# Patient Record
Sex: Male | Born: 1960
Health system: Southern US, Community
[De-identification: ages and names within clinical notes are randomized; demographics above are authoritative.]

## PROBLEM LIST (undated history)

## (undated) DIAGNOSIS — F32A Depression, unspecified: Secondary | ICD-10-CM

## (undated) DIAGNOSIS — F419 Anxiety disorder, unspecified: Secondary | ICD-10-CM

## (undated) DIAGNOSIS — R002 Palpitations: Secondary | ICD-10-CM

## (undated) DIAGNOSIS — E119 Type 2 diabetes mellitus without complications: Secondary | ICD-10-CM

## (undated) DIAGNOSIS — I739 Peripheral vascular disease, unspecified: Secondary | ICD-10-CM

## (undated) DIAGNOSIS — I10 Essential (primary) hypertension: Secondary | ICD-10-CM

## (undated) DIAGNOSIS — H269 Unspecified cataract: Secondary | ICD-10-CM

## (undated) DIAGNOSIS — K589 Irritable bowel syndrome without diarrhea: Secondary | ICD-10-CM

## (undated) DIAGNOSIS — F329 Major depressive disorder, single episode, unspecified: Secondary | ICD-10-CM

## (undated) DIAGNOSIS — E785 Hyperlipidemia, unspecified: Secondary | ICD-10-CM

## (undated) DIAGNOSIS — J45909 Unspecified asthma, uncomplicated: Secondary | ICD-10-CM

## (undated) DIAGNOSIS — J449 Chronic obstructive pulmonary disease, unspecified: Secondary | ICD-10-CM

## (undated) DIAGNOSIS — T7840XA Allergy, unspecified, initial encounter: Secondary | ICD-10-CM

## (undated) DIAGNOSIS — M109 Gout, unspecified: Secondary | ICD-10-CM

## (undated) DIAGNOSIS — K219 Gastro-esophageal reflux disease without esophagitis: Secondary | ICD-10-CM

## (undated) HISTORY — DX: Type 2 diabetes mellitus without complications: E11.9

## (undated) HISTORY — DX: Allergy, unspecified, initial encounter: T78.40XA

## (undated) HISTORY — DX: Chronic obstructive pulmonary disease, unspecified: J44.9

## (undated) HISTORY — DX: Gout, unspecified: M10.9

## (undated) HISTORY — DX: Depression, unspecified: F32.A

## (undated) HISTORY — DX: Major depressive disorder, single episode, unspecified: F32.9

## (undated) HISTORY — DX: Unspecified asthma, uncomplicated: J45.909

## (undated) HISTORY — PX: APPENDECTOMY: SHX54

## (undated) HISTORY — DX: Hyperlipidemia, unspecified: E78.5

## (undated) HISTORY — PX: HERNIA REPAIR: SHX51

## (undated) HISTORY — DX: Palpitations: R00.2

## (undated) HISTORY — DX: Unspecified cataract: H26.9

## (undated) HISTORY — DX: Gastro-esophageal reflux disease without esophagitis: K21.9

## (undated) HISTORY — DX: Peripheral vascular disease, unspecified: I73.9

## (undated) HISTORY — DX: Anxiety disorder, unspecified: F41.9

## (undated) HISTORY — DX: Irritable bowel syndrome, unspecified: K58.9

## (undated) HISTORY — DX: Essential (primary) hypertension: I10

---
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
## 1996-05-03 HISTORY — PX: FUNDOPLASTY TRANSTHORACIC: SUR617

## 2006-05-03 HISTORY — PX: FUNDOPLASTY TRANSTHORACIC: SUR617

## 2010-11-10 ENCOUNTER — Ambulatory Visit (HOSPITAL_BASED_OUTPATIENT_CLINIC_OR_DEPARTMENT_OTHER)
Admission: RE | Admit: 2010-11-10 | Discharge: 2010-11-10 | Disposition: A | Discharge status: Home / Self Care | Payer: 59 | Source: Ambulatory Visit | Attending: Orthopaedic Surgery | Admitting: Orthopaedic Surgery

## 2010-11-10 DIAGNOSIS — M25819 Other specified joint disorders, unspecified shoulder: Secondary | ICD-10-CM | POA: Insufficient documentation

## 2010-11-10 DIAGNOSIS — M75110 Incomplete rotator cuff tear or rupture of unspecified shoulder, not specified as traumatic: Secondary | ICD-10-CM | POA: Insufficient documentation

## 2010-11-10 DIAGNOSIS — J45909 Unspecified asthma, uncomplicated: Secondary | ICD-10-CM | POA: Insufficient documentation

## 2010-11-10 DIAGNOSIS — F319 Bipolar disorder, unspecified: Secondary | ICD-10-CM | POA: Insufficient documentation

## 2010-11-10 DIAGNOSIS — Z01812 Encounter for preprocedural laboratory examination: Secondary | ICD-10-CM | POA: Insufficient documentation

## 2010-12-16 NOTE — Op Note (Signed)
Matthew Ortiz, Matthew Ortiz                ACCOUNT NO.:  [id number]  MEDICAL RECORD NO.:  [id number]  LOCATION:                                 FACILITY:  PHYSICIAN:  Lubertha Basque. Jerl Santos, M.D.     DATE OF BIRTH:  DATE OF PROCEDURE:  11/10/2010 DATE OF DISCHARGE:                              OPERATIVE REPORT   PREOPERATIVE DIAGNOSES: 1. Right shoulder impingement. 2. Right shoulder partial rotator cuff tear.  POSTOPERATIVE DIAGNOSES: 1. Right shoulder impingement. 2. Right shoulder partial rotator cuff tear.Marland Kitchen  PROCEDURE: 1. Right shoulder arthroscopic acromioplasty. 2. Right shoulder arthroscopic partial claviculectomy. 3. Right shoulder arthroscopic cuff debridement.  ANESTHESIA:  General and block.  ATTENDING SURGEON:  Lubertha Basque. Jerl Santos, MD  ASSISTANT:  Lindwood Qua, PA   INDICATIONS FOR PROCEDURE:  The patient is a 50 year old man with a long history of right greater than left shoulder pain.  This has persisted despite an exercise program and 3 subacromial injections all of which did afford him transient relief.  He has had an MRI scan done which shows things consistent with impingement and a partial-thickness cuff tear on the bursal aspect.  He has pain which limits his ability to use his arm and rest and he is offered an arthroscopy.  Informed operative consent was obtained after discussion of possible complications including reaction to anesthesia and infection.  SUMMARY OF FINDINGS AND PROCEDURE:  Under general anesthesia and a block, a right shoulder arthroscopy was performed.  The glenohumeral joint showed no degenerative changes and the biceps tendon and rotator cuff appeared benign from below as did all labral structures.  In the subacromial space, he had some bursitis and partial-thickness cuff tearing addressed with debridement, but no tear worthy of repair was found.  A prominent subacromial morphology was addressed with an acromioplasty and also had a small  prominence of the [facility] joint which was removed.  He was discharged home same day.  DESCRIPTION OF PROCEDURE:  The patient was taken to the operating suite where general anesthetic was applied without difficulty.  He was also given a block in the preanesthesia area.  He was positioned in beach- chair position and prepped and draped in normal sterile fashion.  After the administration of IV vancomycin, an arthroscopy of the right shoulder was performed through a total of 2 portals.  Findings were as noted above.  Procedure consisted of mostly the acromioplasty which was done with a bur in the lateral position followed by transfer of the bur to the posterior position.  We also removed a small spur from the undersurface of the clavicle and debrided the rotator cuff bursal aspect.  The shoulder was thoroughly irrigated followed by reapproximation of the portals loosely with nylon.  Adaptic was applied followed by dry gauze and tape.  Estimated blood loss and intraoperative fluids obtained from anesthesia records.  DISPOSITION:  The patient was extubated in the operating room and taken to recovery room in stable addition.  He was to go home the same day and follow up with me in the office next week.  I will contact him by phone tonight.     Lubertha Basque Jerl Santos, M.D.  PGD/MEDQ  D:  11/10/2010  T:  11/11/2010  Job:  [id number]  Electronically Signed by Marcene Corning M.D. on 12/16/2010 08:31:49 PM

## 2011-01-12 ENCOUNTER — Ambulatory Visit (HOSPITAL_BASED_OUTPATIENT_CLINIC_OR_DEPARTMENT_OTHER)
Admission: RE | Admit: 2011-01-12 | Discharge: 2011-01-12 | Disposition: A | Discharge status: Home / Self Care | Payer: Medicare FFS | Source: Ambulatory Visit | Attending: Orthopaedic Surgery | Admitting: Orthopaedic Surgery

## 2011-01-12 DIAGNOSIS — M25819 Other specified joint disorders, unspecified shoulder: Secondary | ICD-10-CM | POA: Insufficient documentation

## 2011-01-12 DIAGNOSIS — M75110 Incomplete rotator cuff tear or rupture of unspecified shoulder, not specified as traumatic: Secondary | ICD-10-CM | POA: Insufficient documentation

## 2011-01-12 LAB — POCT I-STAT, CHEM 8
BUN: 15 mg/dL (ref 6–23)
Chloride: 106 mEq/L (ref 96–112)
Glucose, Bld: 109 mg/dL — ABNORMAL HIGH (ref 70–99)
HCT: 45 % (ref 39.0–52.0)
Hemoglobin: 15.3 g/dL (ref 13.0–17.0)
Potassium: 4.2 mEq/L (ref 3.5–5.1)
Sodium: 139 mEq/L (ref 135–145)

## 2011-01-22 NOTE — Op Note (Signed)
Matthew Ortiz, Matthew Ortiz               ACCOUNT NO.:  [id number]  MEDICAL RECORD NO.:  [id number]  LOCATION:                                 FACILITY:  PHYSICIAN:  Lubertha Basque. Jerl Santos, M.D.     DATE OF BIRTH:  DATE OF PROCEDURE:  01/12/2011 DATE OF DISCHARGE:                              OPERATIVE REPORT   PREOPERATIVE DIAGNOSES: 1. Left shoulder impingement. 2. Left shoulder partial rotator cuff tear.  POSTOPERATIVE DIAGNOSES: 1. Left shoulder impingement. 2. Left shoulder partial rotator cuff tear.  PROCEDURE: 1. Left shoulder arthroscopic acromioplasty. 2. Left shoulder arthroscopic partial claviculectomy. 3. Left shoulder arthroscopic debridement.  ANESTHESIA:  General and block.  ATTENDING SURGEON:  Lubertha Basque. Jerl Santos, MD  ASSISTANT:  Lindwood Qua, PA   INDICATION FOR PROCEDURE:  The patient is a 50 year old man with a long history of left shoulder difficulty.  This has persisted despite oral anti-inflammatories, injection, and exercise program.  He has an exam and history consistent with impingement related to the shape of the acromion and AC joint.  He has pain which limits his ability to rest and use his arm and is offered an arthroscopy.  Informed operative consent was obtained after discussion of possible complications including reaction to anesthesia and infection.  SUMMARY OF FINDINGS AND PROCEDURE:  Under general anesthesia and a block, a left shoulder arthroscopy was performed.  Glenohumeral joint showed no degenerative changes in the biceps tendon and rotator cuff appeared benign from below.  In the subacromial space, he had some irritation of the rotator cuff, but no tear worthy of repair.  A debridement was done of bursal tissues and that aspect of the cuff.  He had a prominent subacromial morphology addressed with an acromioplasty. He also had a prominence of the [facility] joint addressed with a partial claviculectomy.  He was closed primarily and discharged  home the same day.  DESCRIPTION OF PROCEDURE:  The patient was taken to the operating suite where general anesthetic was applied without difficulty.  He was also given a block in the preanesthesia area.  He was positioned in beach- chair position and prepped and draped in normal sterile fashion.  After administration of the IV antibiotic, an arthroscopy of the left shoulder was performed through a total of 2 portals.  Findings were as noted above and procedure consisted predominantly of the arthroscopic acromioplasty.  This was done with a bur in the lateral position followed by transfer of the bur to the posterior position.  We then performed a prominence on the undersurface of the clavicle in a similar fashion.  I debrided the bursal aspect of the cuff, but no tear worthy of repair was found.  The shoulder was thoroughly irrigated followed by reapproximation of portals loosely with nylon.  Adaptic was applied followed by dry gauze and tape.  Estimated blood loss and intraoperative fluids can be obtained from anesthesia records.DISPOSITION:  The patient was extubated in the operating room and taken to recovery room in stable addition.  He was to go home same day and follow up in my office closely.  I will contact him by phone tonight.     Lubertha Basque Jerl Santos, M.D.  PGD/MEDQ  D:  01/12/2011  T:  01/12/2011  Job:  [id number]  Electronically Signed by Marcene Corning M.D. on 01/22/2011 12:24:57 PM

## 2011-02-23 ENCOUNTER — Other Ambulatory Visit: Payer: Self-pay | Admitting: Internal Medicine

## 2011-02-23 DIAGNOSIS — M79604 Pain in right leg: Secondary | ICD-10-CM

## 2011-02-23 DIAGNOSIS — M79605 Pain in left leg: Secondary | ICD-10-CM

## 2011-02-24 ENCOUNTER — Ambulatory Visit
Admission: RE | Admit: 2011-02-24 | Discharge: 2011-02-24 | Disposition: A | Discharge status: Home / Self Care | Payer: 59 | Source: Ambulatory Visit | Attending: Internal Medicine | Admitting: Internal Medicine

## 2011-02-24 ENCOUNTER — Other Ambulatory Visit: Payer: Self-pay | Admitting: Internal Medicine

## 2011-02-24 DIAGNOSIS — M79604 Pain in right leg: Secondary | ICD-10-CM

## 2011-04-02 DIAGNOSIS — B958 Unspecified staphylococcus as the cause of diseases classified elsewhere: Secondary | ICD-10-CM | POA: Insufficient documentation

## 2011-04-02 DIAGNOSIS — K589 Irritable bowel syndrome without diarrhea: Secondary | ICD-10-CM | POA: Insufficient documentation

## 2011-04-02 DIAGNOSIS — R0683 Snoring: Secondary | ICD-10-CM | POA: Insufficient documentation

## 2011-04-02 DIAGNOSIS — F319 Bipolar disorder, unspecified: Secondary | ICD-10-CM | POA: Insufficient documentation

## 2011-04-02 DIAGNOSIS — K219 Gastro-esophageal reflux disease without esophagitis: Secondary | ICD-10-CM | POA: Insufficient documentation

## 2011-04-02 DIAGNOSIS — K5792 Diverticulitis of intestine, part unspecified, without perforation or abscess without bleeding: Secondary | ICD-10-CM | POA: Insufficient documentation

## 2011-04-02 DIAGNOSIS — M19019 Primary osteoarthritis, unspecified shoulder: Secondary | ICD-10-CM | POA: Insufficient documentation

## 2011-04-02 DIAGNOSIS — M75102 Unspecified rotator cuff tear or rupture of left shoulder, not specified as traumatic: Secondary | ICD-10-CM | POA: Insufficient documentation

## 2011-04-02 DIAGNOSIS — R000 Tachycardia, unspecified: Secondary | ICD-10-CM | POA: Insufficient documentation

## 2011-05-03 DIAGNOSIS — K648 Other hemorrhoids: Secondary | ICD-10-CM | POA: Insufficient documentation

## 2011-05-03 DIAGNOSIS — N529 Male erectile dysfunction, unspecified: Secondary | ICD-10-CM | POA: Insufficient documentation

## 2011-05-03 DIAGNOSIS — N400 Enlarged prostate without lower urinary tract symptoms: Secondary | ICD-10-CM | POA: Insufficient documentation

## 2012-05-03 HISTORY — PX: HERNIA REPAIR: SHX51

## 2013-10-31 ENCOUNTER — Ambulatory Visit (HOSPITAL_COMMUNITY): Payer: 59 | Admitting: Psychiatry

## 2013-12-27 ENCOUNTER — Ambulatory Visit (INDEPENDENT_AMBULATORY_CARE_PROVIDER_SITE_OTHER): Payer: 59 | Admitting: Psychiatry

## 2014-01-03 ENCOUNTER — Ambulatory Visit (HOSPITAL_COMMUNITY): Payer: Self-pay | Admitting: Psychiatry

## 2014-01-22 ENCOUNTER — Encounter (HOSPITAL_COMMUNITY): Payer: Self-pay | Admitting: Psychiatry

## 2014-01-22 ENCOUNTER — Ambulatory Visit (INDEPENDENT_AMBULATORY_CARE_PROVIDER_SITE_OTHER): Payer: 59 | Admitting: Psychiatry

## 2014-01-22 VITALS — BP 132/108 | HR 80 | Ht 72.0 in | Wt 184.4 lb

## 2014-01-22 DIAGNOSIS — F424 Excoriation (skin-picking) disorder: Secondary | ICD-10-CM

## 2014-01-22 DIAGNOSIS — F5105 Insomnia due to other mental disorder: Secondary | ICD-10-CM

## 2014-01-22 DIAGNOSIS — F3189 Other bipolar disorder: Secondary | ICD-10-CM

## 2014-01-22 DIAGNOSIS — F411 Generalized anxiety disorder: Secondary | ICD-10-CM

## 2014-01-22 DIAGNOSIS — F489 Nonpsychotic mental disorder, unspecified: Secondary | ICD-10-CM

## 2014-01-22 DIAGNOSIS — F4310 Post-traumatic stress disorder, unspecified: Secondary | ICD-10-CM

## 2014-01-22 DIAGNOSIS — F3181 Bipolar II disorder: Secondary | ICD-10-CM

## 2014-01-22 MED ORDER — MIRTAZAPINE 15 MG PO TABS: 15.0000 mg | ORAL_TABLET | Freq: Every day | ORAL | Status: DC
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
## 2014-01-29 DIAGNOSIS — K5732 Diverticulitis of large intestine without perforation or abscess without bleeding: Secondary | ICD-10-CM | POA: Insufficient documentation

## 2014-02-01 ENCOUNTER — Telehealth (HOSPITAL_COMMUNITY): Payer: Self-pay | Admitting: *Deleted

## 2014-02-01 NOTE — Telephone Encounter (Signed)
Pt called left message stating he has been weaned off Effexor and is not doing well with it. Called him back he describes feeling like electricity shooting through his body, crying, disoriented, "feels like my brain is bouncing around in my head. Just feels really weird." Talked with his therapist yesterday and wants his doctor to know. Asking if anything can be done.

## 2014-02-05 NOTE — Telephone Encounter (Signed)
Called and left general voice message on home phone number and on work number.   Based off message received it appears that he is experiencing withdrawal symptoms. Slowing the Effexor taper can help decrease withdrawal symptoms.

## 2014-03-26 ENCOUNTER — Encounter (HOSPITAL_COMMUNITY): Payer: Self-pay | Admitting: Psychiatry

## 2014-03-26 ENCOUNTER — Ambulatory Visit (INDEPENDENT_AMBULATORY_CARE_PROVIDER_SITE_OTHER): Payer: 59 | Admitting: Psychiatry

## 2014-03-26 VITALS — BP 129/91 | HR 87 | Ht 72.0 in | Wt 187.0 lb

## 2014-03-26 DIAGNOSIS — F411 Generalized anxiety disorder: Secondary | ICD-10-CM

## 2014-03-26 DIAGNOSIS — F3181 Bipolar II disorder: Secondary | ICD-10-CM

## 2014-03-26 DIAGNOSIS — F5105 Insomnia due to other mental disorder: Secondary | ICD-10-CM

## 2014-03-26 DIAGNOSIS — F4310 Post-traumatic stress disorder, unspecified: Secondary | ICD-10-CM

## 2014-03-26 DIAGNOSIS — F489 Nonpsychotic mental disorder, unspecified: Secondary | ICD-10-CM

## 2014-03-26 MED ORDER — LITHIUM CARBONATE 150 MG PO CAPS: 450.0000 mg | ORAL_CAPSULE | Freq: Two times a day (BID) | ORAL | Status: DC

## 2014-03-26 MED ORDER — MIRTAZAPINE 15 MG PO TABS: 15.0000 mg | ORAL_TABLET | Freq: Every day | ORAL | Status: DC

## 2014-03-26 MED ORDER — CLONAZEPAM 1 MG PO TABS: 1.0000 mg | ORAL_TABLET | Freq: Two times a day (BID) | ORAL | Status: DC
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
## 2014-05-28 ENCOUNTER — Ambulatory Visit (HOSPITAL_COMMUNITY): Payer: Self-pay | Admitting: Psychiatry

## 2014-05-29 ENCOUNTER — Telehealth (HOSPITAL_COMMUNITY): Payer: Self-pay | Admitting: *Deleted

## 2014-05-29 NOTE — Telephone Encounter (Signed)
Patient called and left message on nurse voice mail. I called patient back, patient stated that he needs refill on his Remeron. Patient states he had to reschedule his appointment to 06-04-2013 due to weather and only has two pills left. Patient's prescription for Remeron was sent on 03-26-2014 for 30 tablets with one refill.  I advised patient I will call pharmacy and call him back. [place] patient had medication filled on 11-24 and 12-21, no refills left.   Do you want to refill medication until appointment?

## 2014-05-30 NOTE — Telephone Encounter (Signed)
Yes he can get 10 tabs to get to the scheduled appt

## 2014-05-31 ENCOUNTER — Telehealth (HOSPITAL_COMMUNITY): Payer: Self-pay

## 2014-05-31 ENCOUNTER — Other Ambulatory Visit (HOSPITAL_COMMUNITY): Payer: Self-pay

## 2014-05-31 DIAGNOSIS — F411 Generalized anxiety disorder: Secondary | ICD-10-CM

## 2014-05-31 DIAGNOSIS — F4310 Post-traumatic stress disorder, unspecified: Secondary | ICD-10-CM

## 2014-05-31 DIAGNOSIS — F5105 Insomnia due to other mental disorder: Secondary | ICD-10-CM

## 2014-05-31 MED ORDER — MIRTAZAPINE 15 MG PO TABS: 15.0000 mg | ORAL_TABLET | Freq: Every day | ORAL | Status: DC

## 2014-05-31 NOTE — Telephone Encounter (Signed)
RX was already sent by Beather Arbour, RN.

## 2014-05-31 NOTE — Telephone Encounter (Signed)
Telephone call with patient to inform refill of Remeron was sent in this morning and reminded patient of scheduled evaluation 06/04/14.

## 2014-05-31 NOTE — Telephone Encounter (Signed)
Patient called requesting a refill of his Remeron.  Next appointment scheduled for 06/04/14.  Record reviewed and refill appropriate.

## 2014-06-04 ENCOUNTER — Encounter (HOSPITAL_COMMUNITY): Payer: Self-pay | Admitting: Psychiatry

## 2014-06-04 ENCOUNTER — Ambulatory Visit (INDEPENDENT_AMBULATORY_CARE_PROVIDER_SITE_OTHER): Payer: 59 | Admitting: Psychiatry

## 2014-06-04 VITALS — BP 142/98 | HR 73 | Ht 72.0 in | Wt 190.0 lb

## 2014-06-04 DIAGNOSIS — F411 Generalized anxiety disorder: Secondary | ICD-10-CM

## 2014-06-04 DIAGNOSIS — F3181 Bipolar II disorder: Secondary | ICD-10-CM

## 2014-06-04 DIAGNOSIS — F5105 Insomnia due to other mental disorder: Secondary | ICD-10-CM

## 2014-06-04 DIAGNOSIS — F489 Nonpsychotic mental disorder, unspecified: Secondary | ICD-10-CM

## 2014-06-04 DIAGNOSIS — F4310 Post-traumatic stress disorder, unspecified: Secondary | ICD-10-CM

## 2014-06-04 MED ORDER — MIRTAZAPINE 30 MG PO TABS: 15.0000 mg | ORAL_TABLET | Freq: Every day | ORAL | Status: DC

## 2014-06-04 MED ORDER — LITHIUM CARBONATE 150 MG PO CAPS: 450.0000 mg | ORAL_CAPSULE | Freq: Two times a day (BID) | ORAL | Status: DC

## 2014-06-04 MED ORDER — CLONAZEPAM 1 MG PO TABS: 1.0000 mg | ORAL_TABLET | Freq: Two times a day (BID) | ORAL | Status: DC
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
## 2014-08-06 ENCOUNTER — Ambulatory Visit (INDEPENDENT_AMBULATORY_CARE_PROVIDER_SITE_OTHER): Payer: 59 | Admitting: Psychiatry

## 2014-08-06 ENCOUNTER — Encounter (HOSPITAL_COMMUNITY): Payer: Self-pay | Admitting: Psychiatry

## 2014-08-06 VITALS — BP 120/82 | HR 83 | Ht 72.0 in | Wt 185.8 lb

## 2014-08-06 DIAGNOSIS — L981 Factitial dermatitis: Secondary | ICD-10-CM | POA: Diagnosis not present

## 2014-08-06 DIAGNOSIS — F4310 Post-traumatic stress disorder, unspecified: Secondary | ICD-10-CM

## 2014-08-06 DIAGNOSIS — F411 Generalized anxiety disorder: Secondary | ICD-10-CM

## 2014-08-06 DIAGNOSIS — R45851 Suicidal ideations: Secondary | ICD-10-CM

## 2014-08-06 DIAGNOSIS — F5105 Insomnia due to other mental disorder: Secondary | ICD-10-CM

## 2014-08-06 DIAGNOSIS — F424 Excoriation (skin-picking) disorder: Secondary | ICD-10-CM

## 2014-08-06 DIAGNOSIS — F3181 Bipolar II disorder: Secondary | ICD-10-CM | POA: Diagnosis not present

## 2014-08-06 MED ORDER — CLONAZEPAM 1 MG PO TABS: 1.0000 mg | ORAL_TABLET | Freq: Two times a day (BID) | ORAL | Status: DC

## 2014-08-06 MED ORDER — MIRTAZAPINE 30 MG PO TABS: 15.0000 mg | ORAL_TABLET | Freq: Every day | ORAL | Status: DC

## 2014-08-06 MED ORDER — LITHIUM CARBONATE 600 MG PO CAPS: 600.0000 mg | ORAL_CAPSULE | Freq: Two times a day (BID) | ORAL | Status: DC
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
## 2014-10-08 ENCOUNTER — Ambulatory Visit (HOSPITAL_COMMUNITY): Payer: Self-pay | Admitting: Psychiatry

## 2014-10-22 ENCOUNTER — Ambulatory Visit (HOSPITAL_COMMUNITY): Payer: Self-pay | Admitting: Psychiatry

## 2014-11-06 ENCOUNTER — Encounter (HOSPITAL_COMMUNITY): Payer: Self-pay | Admitting: Psychiatry

## 2014-11-06 ENCOUNTER — Ambulatory Visit (INDEPENDENT_AMBULATORY_CARE_PROVIDER_SITE_OTHER): Payer: 59 | Admitting: Psychiatry

## 2014-11-06 VITALS — BP 125/94 | HR 103 | Ht 72.0 in | Wt 184.4 lb

## 2014-11-06 DIAGNOSIS — F4310 Post-traumatic stress disorder, unspecified: Secondary | ICD-10-CM

## 2014-11-06 DIAGNOSIS — F411 Generalized anxiety disorder: Secondary | ICD-10-CM

## 2014-11-06 DIAGNOSIS — L988 Other specified disorders of the skin and subcutaneous tissue: Secondary | ICD-10-CM

## 2014-11-06 DIAGNOSIS — F3130 Bipolar disorder, current episode depressed, mild or moderate severity, unspecified: Secondary | ICD-10-CM

## 2014-11-06 DIAGNOSIS — F54 Psychological and behavioral factors associated with disorders or diseases classified elsewhere: Secondary | ICD-10-CM
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
## 2014-11-08 ENCOUNTER — Ambulatory Visit (HOSPITAL_COMMUNITY): Payer: Self-pay | Admitting: Psychiatry

## 2014-12-09 ENCOUNTER — Ambulatory Visit (INDEPENDENT_AMBULATORY_CARE_PROVIDER_SITE_OTHER): Payer: 59 | Admitting: Psychiatry

## 2014-12-09 ENCOUNTER — Encounter (HOSPITAL_COMMUNITY): Payer: Self-pay | Admitting: Psychiatry

## 2014-12-09 VITALS — BP 125/88 | HR 84 | Ht 72.0 in | Wt 185.0 lb

## 2014-12-09 DIAGNOSIS — F5105 Insomnia due to other mental disorder: Secondary | ICD-10-CM

## 2014-12-09 DIAGNOSIS — F3181 Bipolar II disorder: Secondary | ICD-10-CM | POA: Diagnosis not present

## 2014-12-09 DIAGNOSIS — F411 Generalized anxiety disorder: Secondary | ICD-10-CM

## 2014-12-09 DIAGNOSIS — F489 Nonpsychotic mental disorder, unspecified: Secondary | ICD-10-CM

## 2014-12-09 DIAGNOSIS — F4310 Post-traumatic stress disorder, unspecified: Secondary | ICD-10-CM | POA: Diagnosis not present

## 2014-12-09 MED ORDER — LITHIUM CARBONATE 600 MG PO CAPS: 600.0000 mg | ORAL_CAPSULE | Freq: Two times a day (BID) | ORAL | Status: DC

## 2014-12-09 MED ORDER — CLONAZEPAM 1 MG PO TABS
1.0000 mg | ORAL_TABLET | Freq: Two times a day (BID) | ORAL | Status: DC
Start: 2014-12-09 — End: 2015-02-13

## 2014-12-09 MED ORDER — MIRTAZAPINE 30 MG PO TABS: 15.0000 mg | ORAL_TABLET | Freq: Every day | ORAL | Status: DC
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
## 2015-02-06 ENCOUNTER — Ambulatory Visit (HOSPITAL_COMMUNITY): Payer: Self-pay | Admitting: Psychiatry

## 2015-02-07 ENCOUNTER — Telehealth (HOSPITAL_COMMUNITY): Payer: Self-pay

## 2015-02-07 DIAGNOSIS — F5105 Insomnia due to other mental disorder: Secondary | ICD-10-CM

## 2015-02-07 DIAGNOSIS — F411 Generalized anxiety disorder: Secondary | ICD-10-CM

## 2015-02-07 DIAGNOSIS — F4310 Post-traumatic stress disorder, unspecified: Secondary | ICD-10-CM

## 2015-02-07 NOTE — Telephone Encounter (Signed)
Medication refill request - Telephone message left for patient after he had left one stating he was rescheduled from 02/06/15 to 02/13/15 due to Dr. [name] out and he will be out of Remeron on 02/12/15.  Patient requests a refill before then to make it until he sees Dr. [name] on 02/13/15 now.  Informed patient on message this nurse would pass on request to Dr. [name] who will be back in the office on 02/11/15 but if patient needed the order prior to this date to please call our office back before then.

## 2015-02-11 NOTE — Telephone Encounter (Signed)
Yes we can refill Remeron

## 2015-02-12 ENCOUNTER — Telehealth (HOSPITAL_COMMUNITY): Payer: Self-pay

## 2015-02-12 MED ORDER — MIRTAZAPINE 30 MG PO TABS: 15.0000 mg | ORAL_TABLET | Freq: Every day | ORAL | Status: DC

## 2015-02-12 NOTE — Telephone Encounter (Signed)
New order e-scribed to patient's [facility]. In [place], [place] as authorized by Dr. [name].  Telephone message left for patient that a new order for his Remeron had been sent to his [facility] and reminded patient of his scheduled evaluation with Dr. [name] for 02/13/15.

## 2015-02-12 NOTE — Telephone Encounter (Signed)
Medication management - left pt a second message this date after he left one questioning which pharmacy his Remeron prescription was e-scribed into.  Informed this went to [facility] in [place] and if pt. wants to change to inform Dr. [name] at his scheduled evaluation on 02/13/15.  Requested patient call back if any questions and also informed on message if he would like order sent in to [facility] today to be transferred to [facility] he could inform them and they could call and transfer the order.  Requested patient call back if any more concerns today and will see again tomorrow.

## 2015-02-13 ENCOUNTER — Ambulatory Visit (INDEPENDENT_AMBULATORY_CARE_PROVIDER_SITE_OTHER): Payer: 59 | Admitting: Psychiatry

## 2015-02-13 ENCOUNTER — Encounter (HOSPITAL_COMMUNITY): Payer: Self-pay | Admitting: Psychiatry

## 2015-02-13 ENCOUNTER — Ambulatory Visit (HOSPITAL_COMMUNITY): Payer: Self-pay | Admitting: Psychiatry

## 2015-02-13 VITALS — BP 125/81 | HR 98 | Ht 72.0 in | Wt 187.8 lb

## 2015-02-13 DIAGNOSIS — F411 Generalized anxiety disorder: Secondary | ICD-10-CM | POA: Diagnosis not present

## 2015-02-13 DIAGNOSIS — F424 Excoriation (skin-picking) disorder: Secondary | ICD-10-CM

## 2015-02-13 DIAGNOSIS — F489 Nonpsychotic mental disorder, unspecified: Secondary | ICD-10-CM | POA: Diagnosis not present

## 2015-02-13 DIAGNOSIS — F4310 Post-traumatic stress disorder, unspecified: Secondary | ICD-10-CM | POA: Diagnosis not present

## 2015-02-13 DIAGNOSIS — F3181 Bipolar II disorder: Secondary | ICD-10-CM | POA: Diagnosis not present

## 2015-02-13 DIAGNOSIS — F5105 Insomnia due to other mental disorder: Secondary | ICD-10-CM

## 2015-02-13 MED ORDER — CLONAZEPAM 1 MG PO TABS: 1.0000 mg | ORAL_TABLET | Freq: Three times a day (TID) | ORAL | Status: DC | PRN

## 2015-02-13 MED ORDER — LITHIUM CARBONATE 600 MG PO CAPS: 600.0000 mg | ORAL_CAPSULE | Freq: Two times a day (BID) | ORAL | Status: DC

## 2015-02-13 MED ORDER — MIRTAZAPINE 45 MG PO TABS: 45.0000 mg | ORAL_TABLET | Freq: Every day | ORAL | Status: DC
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
## 2015-04-15 ENCOUNTER — Telehealth (HOSPITAL_COMMUNITY): Payer: Self-pay

## 2015-04-15 ENCOUNTER — Encounter (HOSPITAL_COMMUNITY): Payer: Self-pay | Admitting: Psychiatry

## 2015-04-15 ENCOUNTER — Ambulatory Visit (INDEPENDENT_AMBULATORY_CARE_PROVIDER_SITE_OTHER): Payer: 59 | Admitting: Psychiatry

## 2015-04-15 VITALS — BP 118/80 | HR 82 | Ht 72.0 in | Wt 181.8 lb

## 2015-04-15 DIAGNOSIS — F489 Nonpsychotic mental disorder, unspecified: Secondary | ICD-10-CM | POA: Diagnosis not present

## 2015-04-15 DIAGNOSIS — F424 Excoriation (skin-picking) disorder: Secondary | ICD-10-CM

## 2015-04-15 DIAGNOSIS — F3181 Bipolar II disorder: Secondary | ICD-10-CM | POA: Diagnosis not present

## 2015-04-15 DIAGNOSIS — R45851 Suicidal ideations: Secondary | ICD-10-CM

## 2015-04-15 DIAGNOSIS — F411 Generalized anxiety disorder: Secondary | ICD-10-CM

## 2015-04-15 DIAGNOSIS — F4310 Post-traumatic stress disorder, unspecified: Secondary | ICD-10-CM

## 2015-04-15 DIAGNOSIS — F5105 Insomnia due to other mental disorder: Secondary | ICD-10-CM

## 2015-04-15 MED ORDER — HYDROXYZINE PAMOATE 25 MG PO CAPS: 25.0000 mg | ORAL_CAPSULE | Freq: Two times a day (BID) | ORAL | Status: DC | PRN

## 2015-04-15 MED ORDER — LITHIUM CARBONATE 600 MG PO CAPS: 600.0000 mg | ORAL_CAPSULE | Freq: Two times a day (BID) | ORAL | Status: DC

## 2015-04-15 MED ORDER — CLONAZEPAM 1 MG PO TABS: 1.0000 mg | ORAL_TABLET | Freq: Two times a day (BID) | ORAL | Status: DC | PRN

## 2015-04-15 MED ORDER — MIRTAZAPINE 30 MG PO TABS: 60.0000 mg | ORAL_TABLET | Freq: Every day | ORAL | Status: DC
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
## 2015-04-15 NOTE — Telephone Encounter (Signed)
Telephone call with Ulyess Blossom at  [place] at [phone] for ID # [id number] to begin a prior authorization for patient's Remeron 30mg , 2 at bedtime.  Application was submitted and was informed could take 24-72 hours for a decision.  [facility] and spoke with Daphanie to inform application was submitted and awaiting a decision.  Agreed to contact them once a response returned.   Called patient to inform a prior authorization had been submitted for his Remeron 30mg , 2 at bedtime increase and would be 24-72 hours before we would receive a response.  Informed would contact patient back once a response was received.

## 2015-04-17 NOTE — Telephone Encounter (Signed)
Medication management - Message left for [place] that patient's new Remeron order was approved coverage from [facility] through until 04/15/16 for 30mg , 2 qhs.

## 2015-05-15 ENCOUNTER — Ambulatory Visit (INDEPENDENT_AMBULATORY_CARE_PROVIDER_SITE_OTHER): Payer: 59 | Admitting: Psychiatry

## 2015-05-15 ENCOUNTER — Encounter (HOSPITAL_COMMUNITY): Payer: Self-pay | Admitting: Psychiatry

## 2015-05-15 VITALS — BP 124/88 | HR 106 | Ht 72.0 in | Wt 188.0 lb

## 2015-05-15 DIAGNOSIS — F424 Excoriation (skin-picking) disorder: Secondary | ICD-10-CM

## 2015-05-15 DIAGNOSIS — F3181 Bipolar II disorder: Secondary | ICD-10-CM | POA: Diagnosis not present

## 2015-05-15 DIAGNOSIS — F5105 Insomnia due to other mental disorder: Secondary | ICD-10-CM

## 2015-05-15 DIAGNOSIS — F4310 Post-traumatic stress disorder, unspecified: Secondary | ICD-10-CM | POA: Diagnosis not present

## 2015-05-15 DIAGNOSIS — F411 Generalized anxiety disorder: Secondary | ICD-10-CM | POA: Diagnosis not present

## 2015-05-15 DIAGNOSIS — F489 Nonpsychotic mental disorder, unspecified: Secondary | ICD-10-CM | POA: Diagnosis not present

## 2015-05-15 MED ORDER — LITHIUM CARBONATE 600 MG PO CAPS: 600.0000 mg | ORAL_CAPSULE | Freq: Two times a day (BID) | ORAL | Status: DC

## 2015-05-15 MED ORDER — HYDROXYZINE PAMOATE 25 MG PO CAPS: 25.0000 mg | ORAL_CAPSULE | Freq: Two times a day (BID) | ORAL | Status: DC | PRN

## 2015-05-15 MED ORDER — CLONAZEPAM 1 MG PO TABS: 1.0000 mg | ORAL_TABLET | Freq: Two times a day (BID) | ORAL | Status: DC | PRN

## 2015-05-15 MED ORDER — MIRTAZAPINE 30 MG PO TABS: 60.0000 mg | ORAL_TABLET | Freq: Every day | ORAL | Status: DC

## 2015-05-15 MED FILL — MIRTAZAPINE 30 MG TABLET: 30 | 30 days supply | Qty: 60 | Fill #0

## 2015-05-15 MED FILL — HYDROXYZINE PAM 25 MG CAP: 25 | 30 days supply | Qty: 60 | Fill #0

## 2015-05-15 MED FILL — LITHIUM CARBONATE 600 MG CA: 600 | 30 days supply | Qty: 60 | Fill #0
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
## 2015-05-22 MED FILL — clonazePAM 1 MG TABS: 1 | 30 days supply | Qty: 60 | Fill #1

## 2015-07-17 ENCOUNTER — Ambulatory Visit (HOSPITAL_COMMUNITY): Payer: Self-pay | Admitting: Psychiatry

## 2015-08-14 MED FILL — HYDROXYZINE PAM 25 MG CAP: 25 | 30 days supply | Qty: 60 | Fill #1

## 2015-08-14 MED FILL — MIRTAZAPINE 30 MG TABLET: 30 | 30 days supply | Qty: 60 | Fill #1

## 2015-09-04 ENCOUNTER — Ambulatory Visit (INDEPENDENT_AMBULATORY_CARE_PROVIDER_SITE_OTHER): Payer: 59 | Admitting: Psychiatry

## 2015-09-04 ENCOUNTER — Encounter (HOSPITAL_COMMUNITY): Payer: Self-pay | Admitting: Psychiatry

## 2015-09-04 VITALS — BP 124/78 | HR 102 | Ht 72.0 in | Wt 187.8 lb

## 2015-09-04 DIAGNOSIS — F411 Generalized anxiety disorder: Secondary | ICD-10-CM

## 2015-09-04 DIAGNOSIS — F489 Nonpsychotic mental disorder, unspecified: Secondary | ICD-10-CM

## 2015-09-04 DIAGNOSIS — F4310 Post-traumatic stress disorder, unspecified: Secondary | ICD-10-CM | POA: Diagnosis not present

## 2015-09-04 DIAGNOSIS — F5105 Insomnia due to other mental disorder: Secondary | ICD-10-CM

## 2015-09-04 DIAGNOSIS — F424 Excoriation (skin-picking) disorder: Secondary | ICD-10-CM

## 2015-09-04 DIAGNOSIS — F3181 Bipolar II disorder: Secondary | ICD-10-CM | POA: Diagnosis not present

## 2015-09-04 MED ORDER — MIRTAZAPINE 30 MG PO TABS: 60.0000 mg | ORAL_TABLET | Freq: Every day | ORAL | Status: DC

## 2015-09-04 MED ORDER — HYDROXYZINE PAMOATE 25 MG PO CAPS: 25.0000 mg | ORAL_CAPSULE | Freq: Every evening | ORAL | Status: DC | PRN

## 2015-09-04 MED ORDER — LITHIUM CARBONATE 600 MG PO CAPS: 600.0000 mg | ORAL_CAPSULE | Freq: Two times a day (BID) | ORAL | Status: DC

## 2015-09-04 MED ORDER — CLONAZEPAM 1 MG PO TABS: 1.0000 mg | ORAL_TABLET | Freq: Two times a day (BID) | ORAL | Status: DC | PRN

## 2015-09-04 MED FILL — clonazePAM 1 MG TABS: 1 | 30 days supply | Qty: 60 | Fill #0

## 2015-09-04 MED FILL — LITHIUM CARBONATE 600 MG CA: 600 | 30 days supply | Qty: 60 | Fill #0
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
## 2015-10-13 MED FILL — HYDROXYZINE PAM 25 MG CAP: 25 | 30 days supply | Qty: 60 | Fill #1

## 2015-11-10 MED FILL — MIRTAZAPINE 30 MG TABLET: 30 | 30 days supply | Qty: 60 | Fill #0

## 2015-11-13 ENCOUNTER — Ambulatory Visit (HOSPITAL_COMMUNITY): Payer: Self-pay | Admitting: Psychiatry

## 2015-12-15 ENCOUNTER — Other Ambulatory Visit (HOSPITAL_COMMUNITY): Payer: Self-pay

## 2015-12-15 DIAGNOSIS — F424 Excoriation (skin-picking) disorder: Secondary | ICD-10-CM

## 2015-12-15 DIAGNOSIS — F5105 Insomnia due to other mental disorder: Secondary | ICD-10-CM

## 2015-12-15 MED ORDER — HYDROXYZINE PAMOATE 25 MG PO CAPS: 25.0000 mg | ORAL_CAPSULE | Freq: Every evening | ORAL | 1 refills | Status: DC | PRN

## 2015-12-15 MED FILL — HYDROXYZINE PAM 25 MG CAP: 25 | 30 days supply | Qty: 30 | Fill #0

## 2016-01-19 MED FILL — MIRTAZAPINE 30 MG TABLET: 30 | 30 days supply | Qty: 60 | Fill #1

## 2016-01-19 MED FILL — clonazePAM 1 MG TABS: 1 | 30 days supply | Qty: 60 | Fill #0

## 2016-01-26 MED FILL — HYDROXYZINE PAM 25 MG CAP: 25 | 30 days supply | Qty: 30 | Fill #1

## 2016-02-05 ENCOUNTER — Encounter (HOSPITAL_COMMUNITY): Payer: Self-pay | Admitting: Psychiatry

## 2016-02-05 ENCOUNTER — Ambulatory Visit (INDEPENDENT_AMBULATORY_CARE_PROVIDER_SITE_OTHER): Payer: 59 | Admitting: Psychiatry

## 2016-02-05 DIAGNOSIS — F411 Generalized anxiety disorder: Secondary | ICD-10-CM

## 2016-02-05 DIAGNOSIS — F4310 Post-traumatic stress disorder, unspecified: Secondary | ICD-10-CM | POA: Diagnosis not present

## 2016-02-05 DIAGNOSIS — F424 Excoriation (skin-picking) disorder: Secondary | ICD-10-CM | POA: Diagnosis not present

## 2016-02-05 DIAGNOSIS — F3181 Bipolar II disorder: Secondary | ICD-10-CM | POA: Diagnosis not present

## 2016-02-05 DIAGNOSIS — R45851 Suicidal ideations: Secondary | ICD-10-CM

## 2016-02-05 DIAGNOSIS — F5105 Insomnia due to other mental disorder: Secondary | ICD-10-CM | POA: Diagnosis not present

## 2016-02-05 MED ORDER — LITHIUM CARBONATE 600 MG PO CAPS: 600.0000 mg | ORAL_CAPSULE | Freq: Two times a day (BID) | ORAL | 3 refills | Status: DC

## 2016-02-05 MED ORDER — MIRTAZAPINE 30 MG PO TABS: 60.0000 mg | ORAL_TABLET | Freq: Every day | ORAL | 3 refills | Status: DC

## 2016-02-05 MED ORDER — CLONAZEPAM 1 MG PO TABS: 1.0000 mg | ORAL_TABLET | Freq: Three times a day (TID) | ORAL | 3 refills | Status: DC | PRN

## 2016-02-05 MED ORDER — HYDROXYZINE PAMOATE 25 MG PO CAPS: 25.0000 mg | ORAL_CAPSULE | Freq: Every evening | ORAL | 3 refills | Status: DC | PRN

## 2016-02-05 MED FILL — LITHIUM CARBONATE 600 MG CA: 600 | 30 days supply | Qty: 60 | Fill #0
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
## 2016-02-12 MED FILL — clonazePAM 1 MG TABS: 1 | 30 days supply | Qty: 90 | Fill #0

## 2016-02-24 MED FILL — HYDROXYZINE PAM 25 MG CAP: 25 | 30 days supply | Qty: 30 | Fill #0

## 2016-03-29 MED FILL — HYDROXYZINE PAM 25 MG CAP: 25 | 30 days supply | Qty: 30 | Fill #1

## 2016-03-29 MED FILL — clonazePAM 1 MG TABS: 1 | 30 days supply | Qty: 90 | Fill #1

## 2016-04-22 ENCOUNTER — Encounter (HOSPITAL_COMMUNITY): Payer: Self-pay | Admitting: Psychiatry

## 2016-04-22 ENCOUNTER — Ambulatory Visit (INDEPENDENT_AMBULATORY_CARE_PROVIDER_SITE_OTHER): Payer: 59 | Admitting: Psychiatry

## 2016-04-22 DIAGNOSIS — R45851 Suicidal ideations: Secondary | ICD-10-CM

## 2016-04-22 DIAGNOSIS — F3181 Bipolar II disorder: Secondary | ICD-10-CM

## 2016-04-22 DIAGNOSIS — F5105 Insomnia due to other mental disorder: Secondary | ICD-10-CM | POA: Diagnosis not present

## 2016-04-22 DIAGNOSIS — F424 Excoriation (skin-picking) disorder: Secondary | ICD-10-CM

## 2016-04-22 DIAGNOSIS — F411 Generalized anxiety disorder: Secondary | ICD-10-CM

## 2016-04-22 DIAGNOSIS — Z813 Family history of other psychoactive substance abuse and dependence: Secondary | ICD-10-CM

## 2016-04-22 DIAGNOSIS — F4310 Post-traumatic stress disorder, unspecified: Secondary | ICD-10-CM | POA: Diagnosis not present

## 2016-04-22 DIAGNOSIS — Z811 Family history of alcohol abuse and dependence: Secondary | ICD-10-CM

## 2016-04-22 DIAGNOSIS — Z818 Family history of other mental and behavioral disorders: Secondary | ICD-10-CM

## 2016-04-22 DIAGNOSIS — Z79899 Other long term (current) drug therapy: Secondary | ICD-10-CM

## 2016-04-22 MED ORDER — MIRTAZAPINE 30 MG PO TABS: 60.0000 mg | ORAL_TABLET | Freq: Every day | ORAL | 3 refills | Status: DC

## 2016-04-22 MED ORDER — CLONAZEPAM 1 MG PO TABS: 1.0000 mg | ORAL_TABLET | Freq: Three times a day (TID) | ORAL | 3 refills | Status: DC | PRN

## 2016-04-22 MED ORDER — LITHIUM CARBONATE 600 MG PO CAPS: 600.0000 mg | ORAL_CAPSULE | Freq: Two times a day (BID) | ORAL | 3 refills | Status: DC

## 2016-04-22 MED ORDER — HYDROXYZINE PAMOATE 25 MG PO CAPS
25.0000 mg | ORAL_CAPSULE | Freq: Every evening | ORAL | 3 refills | Status: DC | PRN
Start: 2016-04-22 — End: 2016-09-15

## 2016-04-22 MED FILL — MIRTAZAPINE 30 MG TABLET: 30 | 30 days supply | Qty: 60 | Fill #0

## 2016-04-22 MED FILL — clonazePAM 1 MG TABS: 1 | 30 days supply | Qty: 90 | Fill #2

## 2016-04-22 MED FILL — HYDROXYZINE PAM 25 MG CAP: 25 | 30 days supply | Qty: 30 | Fill #0
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
## 2016-07-15 MED FILL — HYDROXYZINE PAM 25 MG CAP: 25 | 30 days supply | Qty: 30 | Fill #1

## 2016-07-15 MED FILL — MIRTAZAPINE 30 MG TABLET: 30 | 30 days supply | Qty: 60 | Fill #1

## 2016-07-15 MED FILL — LITHIUM CARBONATE 600 MG CA: 600 | 30 days supply | Qty: 60 | Fill #1

## 2016-07-15 MED FILL — clonazePAM 1 MG TABS: 1 | 30 days supply | Qty: 90 | Fill #3

## 2016-07-20 ENCOUNTER — Ambulatory Visit (HOSPITAL_COMMUNITY): Payer: Self-pay | Admitting: Psychiatry

## 2016-07-29 ENCOUNTER — Ambulatory Visit (HOSPITAL_COMMUNITY): Payer: Self-pay | Admitting: Psychiatry

## 2016-08-26 ENCOUNTER — Ambulatory Visit (HOSPITAL_COMMUNITY): Payer: Self-pay | Admitting: Psychiatry

## 2016-09-15 ENCOUNTER — Other Ambulatory Visit (HOSPITAL_COMMUNITY): Payer: Self-pay

## 2016-09-15 DIAGNOSIS — F3181 Bipolar II disorder: Secondary | ICD-10-CM

## 2016-09-15 DIAGNOSIS — F411 Generalized anxiety disorder: Secondary | ICD-10-CM

## 2016-09-15 DIAGNOSIS — F424 Excoriation (skin-picking) disorder: Secondary | ICD-10-CM

## 2016-09-15 DIAGNOSIS — F4310 Post-traumatic stress disorder, unspecified: Secondary | ICD-10-CM

## 2016-09-15 DIAGNOSIS — F5105 Insomnia due to other mental disorder: Secondary | ICD-10-CM

## 2016-09-15 MED ORDER — HYDROXYZINE PAMOATE 25 MG PO CAPS: 25.0000 mg | ORAL_CAPSULE | Freq: Every evening | ORAL | 1 refills | Status: DC | PRN

## 2016-09-15 MED ORDER — MIRTAZAPINE 30 MG PO TABS: 60.0000 mg | ORAL_TABLET | Freq: Every day | ORAL | 1 refills | Status: DC

## 2016-09-15 MED ORDER — LITHIUM CARBONATE 600 MG PO CAPS: 600.0000 mg | ORAL_CAPSULE | Freq: Two times a day (BID) | ORAL | 1 refills | Status: DC

## 2016-09-15 NOTE — Progress Notes (Signed)
Refill request for patients Hydroxyzine, Lithium, and Mirtazapine received from the pharmacy. Patient has a follow up on 6/28 and is due, I sent in 30 days with one refill to get him to his appointment

## 2016-09-17 ENCOUNTER — Other Ambulatory Visit (HOSPITAL_COMMUNITY): Payer: Self-pay

## 2016-09-17 DIAGNOSIS — F4310 Post-traumatic stress disorder, unspecified: Secondary | ICD-10-CM

## 2016-09-17 DIAGNOSIS — F5105 Insomnia due to other mental disorder: Secondary | ICD-10-CM

## 2016-09-17 NOTE — Telephone Encounter (Signed)
Medication refill request - Fax received from [facility] for a refill of patient's prescribed Clonazepam, last ordered 04/22/17 + 3 refills. Patient was canceled from 07/20/16 due to provider out, patient canceled 07/29/16 and then no showed 08/26/16.  Patient is now scheduled for next appointment 10/28/16.

## 2016-09-23 NOTE — Telephone Encounter (Signed)
We can enough to get to scheduled appt on 6/28

## 2016-09-24 MED ORDER — CLONAZEPAM 1 MG PO TABS: 1.0000 mg | ORAL_TABLET | Freq: Three times a day (TID) | ORAL | 0 refills | Status: DC | PRN

## 2016-09-24 NOTE — Telephone Encounter (Signed)
Called in a one month supply.

## 2016-10-07 MED FILL — MIRTAZAPINE 30 MG TABLET: 30 | 47 days supply | Qty: 60 | Fill #2

## 2016-10-28 ENCOUNTER — Encounter (HOSPITAL_COMMUNITY): Payer: Self-pay | Admitting: Psychiatry

## 2016-10-28 ENCOUNTER — Ambulatory Visit (INDEPENDENT_AMBULATORY_CARE_PROVIDER_SITE_OTHER): Payer: 59 | Admitting: Psychiatry

## 2016-10-28 DIAGNOSIS — Z811 Family history of alcohol abuse and dependence: Secondary | ICD-10-CM

## 2016-10-28 DIAGNOSIS — Z813 Family history of other psychoactive substance abuse and dependence: Secondary | ICD-10-CM

## 2016-10-28 DIAGNOSIS — F424 Excoriation (skin-picking) disorder: Secondary | ICD-10-CM | POA: Diagnosis not present

## 2016-10-28 DIAGNOSIS — F3181 Bipolar II disorder: Secondary | ICD-10-CM

## 2016-10-28 DIAGNOSIS — F411 Generalized anxiety disorder: Secondary | ICD-10-CM | POA: Diagnosis not present

## 2016-10-28 DIAGNOSIS — G4700 Insomnia, unspecified: Secondary | ICD-10-CM

## 2016-10-28 DIAGNOSIS — Z87891 Personal history of nicotine dependence: Secondary | ICD-10-CM

## 2016-10-28 DIAGNOSIS — F4310 Post-traumatic stress disorder, unspecified: Secondary | ICD-10-CM

## 2016-10-28 DIAGNOSIS — Z91011 Allergy to milk products: Secondary | ICD-10-CM

## 2016-10-28 DIAGNOSIS — F410 Panic disorder [episodic paroxysmal anxiety] without agoraphobia: Secondary | ICD-10-CM

## 2016-10-28 DIAGNOSIS — Z880 Allergy status to penicillin: Secondary | ICD-10-CM

## 2016-10-28 DIAGNOSIS — Z818 Family history of other mental and behavioral disorders: Secondary | ICD-10-CM

## 2016-10-28 DIAGNOSIS — F5105 Insomnia due to other mental disorder: Secondary | ICD-10-CM

## 2016-10-28 MED ORDER — CLONAZEPAM 1 MG PO TABS: 1.0000 mg | ORAL_TABLET | Freq: Three times a day (TID) | ORAL | 0 refills | Status: DC | PRN

## 2016-10-28 MED ORDER — MIRTAZAPINE 30 MG PO TABS: 60.0000 mg | ORAL_TABLET | Freq: Every day | ORAL | 0 refills | Status: DC

## 2016-10-28 MED ORDER — HYDROXYZINE PAMOATE 25 MG PO CAPS: 25.0000 mg | ORAL_CAPSULE | Freq: Every evening | ORAL | 0 refills | Status: DC | PRN

## 2016-10-28 MED ORDER — ESCITALOPRAM OXALATE 20 MG PO TABS: 20.0000 mg | ORAL_TABLET | Freq: Every day | ORAL | 0 refills | Status: DC

## 2016-10-28 MED ORDER — LITHIUM CARBONATE 600 MG PO CAPS: 600.0000 mg | ORAL_CAPSULE | Freq: Two times a day (BID) | ORAL | 0 refills | Status: DC

## 2016-10-28 MED FILL — HYDROXYZINE PAM 25 MG CAP: 25 | 90 days supply | Qty: 90 | Fill #0

## 2016-10-28 MED FILL — clonazePAM 1 MG TABS: 1 | 30 days supply | Qty: 90 | Fill #0

## 2016-10-28 MED FILL — ESCITALOPRAM 20 MG TABLET: 20 | 90 days supply | Qty: 90 | Fill #0

## 2016-10-28 NOTE — Progress Notes (Signed)
BH MD/PA/NP OP Progress Note  10/28/2016 2:10 PM Matthew Ortiz  MRN:  [id number]  Chief Complaint:  Chief Complaint    Follow-up      HPI: Pt reports his dog died in Sep 04, 2022. The dog was his best friend. Pt states his dog knew the pt's emotions better than the pt. Pt is having a tough time dealing with the dog's passing.  Pt's depression is worse due to his dog's recent death. He is has a lot of sadness and crying spells. Pt denies SI/HI.  Pt has panic attacks daily when he lies down for a nap. Pt is able to sleep well at night.   He continues to pick his skin daily and it worse since his dog died. He thinks it is a defense mechanism.   PTSD- pt reports isolation and HV. He continues to have flashbacks weekly. He has daily intrusive thoughts.  Taking meds as prescribed and denies SE.    Visit Diagnosis:    ICD-10-CM   1. PTSD (post-traumatic stress disorder) F43.10 escitalopram (LEXAPRO) 20 MG tablet    clonazePAM (KLONOPIN) 1 MG tablet    mirtazapine (REMERON) 30 MG tablet  2. Insomnia related to another mental disorder F51.05 clonazePAM (KLONOPIN) 1 MG tablet    hydrOXYzine (VISTARIL) 25 MG capsule    mirtazapine (REMERON) 30 MG tablet  3. Skin-picking disorder F42.4 hydrOXYzine (VISTARIL) 25 MG capsule  4. Bipolar II disorder (HCC) F31.81 lithium 600 MG capsule  5. GAD (generalized anxiety disorder) F41.1 escitalopram (LEXAPRO) 20 MG tablet    mirtazapine (REMERON) 30 MG tablet      Past Psychiatric History:  Anxiety: Yes Bipolar Disorder: Yes Depression: Yes Mania: Yes Psychosis: No Schizophrenia: No Personality Disorder: No Hospitalization for psychiatric illness: Yes History of Electroconvulsive Shock Therapy: No Prior Suicide Attempts: Yes Previous meds: Seroquel, Abilify, Elavil, Wellbutrin, Ambien, Paxil, Effexor  Past Medical History:  Past Medical History:  Diagnosis Date  . Asthma   . COPD (chronic obstructive pulmonary disease) ([place])   . Diabetes  mellitus, type II ([place])   . GERD (gastroesophageal reflux disease)   . HTN (hypertension)   . IBS (irritable bowel syndrome)   . Palpitations     Past Surgical History:  Procedure Laterality Date  . APPENDECTOMY    . FUNDOPLASTY TRANSTHORACIC    . HERNIA REPAIR    . RECONSTRUCTION OF NOSE      Family Psychiatric History: Family History  Problem Relation Age of Onset  . Depression Mother   . Anxiety disorder Mother   . Schizophrenia Father   . Suicidality Father   . Suicidality Paternal Grandfather   . Drug abuse Paternal Grandfather   . Alcohol abuse Paternal Grandfather     Social History:  Social History   Social History  . Marital status: Married    Spouse name: N/A  . Number of children: N/A  . Years of education: N/A   Social History Main Topics  . Smoking status: Former Smoker    Quit date: 05/03/1998  . Smokeless tobacco: Never Used  . Alcohol use No     Comment: socially-a few times a year  . Drug use: No     Comment: last times was 1 yr ago  . Sexual activity: No   Other Topics Concern  . None   Social History Narrative  . None    Allergies:  Allergies  Allergen Reactions  . Milk-Related Compounds Diarrhea  . Penicillins Rash    Metabolic Disorder Labs:  No results found for: HGBA1C, MPG No results found for: PROLACTIN No results found for: CHOL, TRIG, HDL, CHOLHDL, VLDL, LDLCALC   Current Medications: Current Outpatient Prescriptions  Medication Sig Dispense Refill  . albuterol (PROAIR HFA) 108 (90 BASE) MCG/ACT inhaler Inhale 1-2 puffs into the lungs every 6 (six) hours as needed for wheezing or shortness of breath.    . clonazePAM (KLONOPIN) 1 MG tablet Take 1 tablet (1 mg total) by mouth 3 (three) times daily as needed for anxiety. 90 tablet 0  . Eluxadoline (VIBERZI) 75 MG TABS Take 75 mg by mouth 2 (two) times daily as needed.    . Fish Oil-Cholecalciferol (FISH OIL + D3 PO) Take by mouth.    . fluticasone (FLONASE) 50 MCG/ACT nasal  spray Place 1 spray into both nostrils daily.    . hydrOXYzine (VISTARIL) 25 MG capsule Take 1 capsule (25 mg total) by mouth at bedtime as needed. 30 capsule 1  . lithium 600 MG capsule Take 1 capsule (600 mg total) by mouth 2 (two) times daily with a meal. 60 capsule 1  . metFORMIN (GLUCOPHAGE) 500 MG tablet Take 500 mg by mouth 2 (two) times daily with a meal.    . metoprolol (LOPRESSOR) 50 MG tablet Take 50 mg by mouth 2 (two) times daily.    . mirtazapine (REMERON) 30 MG tablet Take 2 tablets (60 mg total) by mouth at bedtime. 60 tablet 1  . omeprazole (PRILOSEC) 40 MG capsule Take 40 mg by mouth daily.     No current facility-administered medications for this visit.      Musculoskeletal: Strength & Muscle Tone: within normal limits Gait & Station: normal Patient leans: N/A  Psychiatric Specialty Exam: Review of Systems  Neurological: Negative for dizziness, tingling, tremors and headaches.  Psychiatric/Behavioral: Positive for depression. Negative for hallucinations, substance abuse and suicidal ideas. The patient is nervous/anxious. The patient does not have insomnia.     Blood pressure 122/72, pulse (!) 102, height 6' (1.829 m), weight 184 lb 9.6 oz (83.7 kg).Body mass index is 25.04 kg/m.  General Appearance: Fairly Groomed  Eye Contact:  Good  Speech:  Clear and Coherent and Normal Rate  Volume:  Normal  Mood:  Anxious and Depressed  Affect:  Congruent  Thought Process:  Coherent and Descriptions of Associations: Intact  Orientation:  Full (Time, Place, and Person)  Thought Content: Rumination   Suicidal Thoughts:  No  Homicidal Thoughts:  No  Memory:  Immediate;   Good Recent;   Good Remote;   Good  Judgement:  Good  Insight:  Good  Psychomotor Activity:  Normal  Concentration:  Concentration: Good and Attention Span: Good  Recall:  Good  Fund of Knowledge: Good  Language: Good  Akathisia:  No  Handed:  Right  AIMS (if indicated):  n/a  Assets:   Communication Skills Desire for Improvement Housing  ADL's:  Intact  Cognition: WNL  Sleep:  fair     Treatment Plan Summary:Medication management  Assessment: PTSD; Bipolar II- current episode depressed; GAD; Skin picking disorder; Insomnia   Medication management with supportive therapy. Risks/benefits and SE of the medication discussed. Pt verbalized understanding and verbal consent obtained for treatment.  Affirm with the patient that the medications are taken as ordered. Patient expressed understanding of how their medications were to be used.   Meds: Lithium 600mg  po BID for Bipolar II Klonopin 1mg  po TID for anxiety Remeron 60mg  po qHS for anxiety and depression Vistaril 25-50mg  po qHS  prn insomnia Start trial of Lexapro 20mg  po qD for depression and anxiety  Labs: none  Therapy: brief supportive therapy provided. Discussed psychosocial stressors in detail.     Consultations:encouraged to continue individual therapy  Pt denies SI and is at an acute low risk for suicide. Patient told to call clinic if any problems occur. Patient advised to go to ER if they should develop SI/HI, side effects, or if symptoms worsen. Has crisis numbers to call if needed. Pt verbalized understanding.  F/up in 3 months or sooner if needed   Charlcie Cradle, MD 10/28/2016, 2:10 PM

## 2016-11-04 ENCOUNTER — Telehealth (HOSPITAL_COMMUNITY): Payer: Self-pay | Admitting: *Deleted

## 2016-11-04 NOTE — Telephone Encounter (Signed)
Called for prior authorization was told a form must be filled out and faxed. Form was filled out and faxed to [facility] at [phone]

## 2017-01-26 ENCOUNTER — Encounter: Payer: Self-pay | Admitting: Family Medicine

## 2017-01-27 ENCOUNTER — Encounter (HOSPITAL_COMMUNITY): Payer: Self-pay | Admitting: Psychiatry

## 2017-01-27 ENCOUNTER — Ambulatory Visit (INDEPENDENT_AMBULATORY_CARE_PROVIDER_SITE_OTHER): Payer: Medicare PPO | Admitting: Psychiatry

## 2017-01-27 DIAGNOSIS — F3181 Bipolar II disorder: Secondary | ICD-10-CM

## 2017-01-27 DIAGNOSIS — Z599 Problem related to housing and economic circumstances, unspecified: Secondary | ICD-10-CM | POA: Diagnosis not present

## 2017-01-27 DIAGNOSIS — R5383 Other fatigue: Secondary | ICD-10-CM

## 2017-01-27 DIAGNOSIS — Z87891 Personal history of nicotine dependence: Secondary | ICD-10-CM

## 2017-01-27 DIAGNOSIS — Z813 Family history of other psychoactive substance abuse and dependence: Secondary | ICD-10-CM

## 2017-01-27 DIAGNOSIS — M791 Myalgia: Secondary | ICD-10-CM | POA: Diagnosis not present

## 2017-01-27 DIAGNOSIS — F411 Generalized anxiety disorder: Secondary | ICD-10-CM

## 2017-01-27 DIAGNOSIS — Z818 Family history of other mental and behavioral disorders: Secondary | ICD-10-CM | POA: Diagnosis not present

## 2017-01-27 DIAGNOSIS — Z634 Disappearance and death of family member: Secondary | ICD-10-CM | POA: Diagnosis not present

## 2017-01-27 DIAGNOSIS — R51 Headache: Secondary | ICD-10-CM

## 2017-01-27 DIAGNOSIS — F940 Selective mutism: Secondary | ICD-10-CM | POA: Diagnosis not present

## 2017-01-27 DIAGNOSIS — Z811 Family history of alcohol abuse and dependence: Secondary | ICD-10-CM

## 2017-01-27 DIAGNOSIS — F4310 Post-traumatic stress disorder, unspecified: Secondary | ICD-10-CM | POA: Diagnosis not present

## 2017-01-27 DIAGNOSIS — F515 Nightmare disorder: Secondary | ICD-10-CM

## 2017-01-27 DIAGNOSIS — R450 Nervousness: Secondary | ICD-10-CM

## 2017-01-27 DIAGNOSIS — F5105 Insomnia due to other mental disorder: Secondary | ICD-10-CM | POA: Diagnosis not present

## 2017-01-27 DIAGNOSIS — R5381 Other malaise: Secondary | ICD-10-CM

## 2017-01-27 MED ORDER — MIRTAZAPINE 30 MG PO TABS: 60.0000 mg | ORAL_TABLET | Freq: Every day | ORAL | 0 refills | Status: DC

## 2017-01-27 MED ORDER — ESCITALOPRAM OXALATE 20 MG PO TABS: 20.0000 mg | ORAL_TABLET | Freq: Every day | ORAL | 0 refills | Status: DC

## 2017-01-27 MED ORDER — CLONAZEPAM 1 MG PO TABS: 1.0000 mg | ORAL_TABLET | Freq: Three times a day (TID) | ORAL | 0 refills | Status: DC | PRN

## 2017-01-27 MED ORDER — LITHIUM CARBONATE 600 MG PO CAPS: 600.0000 mg | ORAL_CAPSULE | Freq: Two times a day (BID) | ORAL | 0 refills | Status: DC

## 2017-01-27 MED ORDER — PRAZOSIN HCL 1 MG PO CAPS: 1.0000 mg | ORAL_CAPSULE | Freq: Every day | ORAL | 0 refills | Status: DC

## 2017-01-27 MED FILL — ESCITALOPRAM 20 MG TABLET: 20 | 90 days supply | Qty: 90 | Fill #0

## 2017-01-27 MED FILL — LITHIUM CARBONATE 600 MG CA: 600 | 90 days supply | Qty: 180 | Fill #0

## 2017-01-27 NOTE — Progress Notes (Signed)
BH MD/PA/NP OP Progress Note  01/27/2017 1:18 PM Matthew Ortiz  MRN:  [id number]  Chief Complaint:  Chief Complaint    Follow-up     HPI: "I feel better this visit than I did last visit". His dog has been dead for 5 months now.  pt states the skin picking has been worse for the last several weeks. He is picking at the skin at his thumbs until they bleed. He is unable to control it.  PTSD- he has been bothered by a lot of self-doubt and intrusive memories on a daily basis. He has nightmares several times a week. He has been dreaming about people he doesn't like telling him he is a bad person and all the mistakes he has made are his fault. Pt is sleeping thru the night and not waking up due to dreams anymore. Pt is having flashbacks about his past mistakes. He is avoiding his neighbor and pt's HV remains high.  Depression is constant. He found out he can not afford to buy the minimal house even with the GI bill. Pt wants to move out of [place]. Pt is going to try anyway but he is feeling trapped. Pt denies SI/HI.   Pt has a lot of anxiety when he lies down for a nap or at bedtime.  Pt states-taking meds as prescribed and denies SE. He thinks the Lexapro is helping some.    Visit Diagnosis:    ICD-10-CM   1. PTSD (post-traumatic stress disorder) F43.10 clonazePAM (KLONOPIN) 1 MG tablet    escitalopram (LEXAPRO) 20 MG tablet    mirtazapine (REMERON) 30 MG tablet    prazosin (MINIPRESS) 1 MG capsule  2. Insomnia related to another mental disorder F51.05 clonazePAM (KLONOPIN) 1 MG tablet    mirtazapine (REMERON) 30 MG tablet    prazosin (MINIPRESS) 1 MG capsule  3. GAD (generalized anxiety disorder) F41.1 escitalopram (LEXAPRO) 20 MG tablet    mirtazapine (REMERON) 30 MG tablet  4. Bipolar II disorder (HCC) F31.81 lithium 600 MG capsule     Past Psychiatric History:  Anxiety:Yes Bipolar Disorder:Yes Depression:Yes Mania:Yes Psychosis:No Schizophrenia:No Personality  Disorder:No Hospitalization for psychiatric illness:Yes History of Electroconvulsive Shock Therapy:No Prior Suicide Attempts:Yes Previous meds: Seroquel, Abilify, Elavil, Wellbutrin, Ambien, Paxil, Effexor  Past Medical History:  Past Medical History:  Diagnosis Date  . Asthma   . COPD (chronic obstructive pulmonary disease) ([place])   . Diabetes mellitus, type II ([place])   . GERD (gastroesophageal reflux disease)   . HTN (hypertension)   . IBS (irritable bowel syndrome)   . Palpitations     Past Surgical History:  Procedure Laterality Date  . APPENDECTOMY    . FUNDOPLASTY TRANSTHORACIC    . HERNIA REPAIR    . RECONSTRUCTION OF NOSE      Family Psychiatric History: Family History  Problem Relation Age of Onset  . Depression Mother   . Anxiety disorder Mother   . Schizophrenia Father   . Suicidality Father   . Suicidality Paternal Grandfather   . Drug abuse Paternal Grandfather   . Alcohol abuse Paternal Grandfather     Social History:  Social History   Social History  . Marital status: Married    Spouse name: N/A  . Number of children: N/A  . Years of education: N/A   Social History Main Topics  . Smoking status: Former Smoker    Quit date: 05/03/1998  . Smokeless tobacco: Never Used  . Alcohol use No     Comment: socially-a  few times a year  . Drug use: No     Comment: last times was 1 yr ago  . Sexual activity: No   Other Topics Concern  . None   Social History Narrative  . None    Allergies:  Allergies  Allergen Reactions  . Milk-Related Compounds Diarrhea  . Penicillins Rash    Metabolic Disorder Labs: No results found for: HGBA1C, MPG No results found for: PROLACTIN No results found for: CHOL, TRIG, HDL, CHOLHDL, VLDL, LDLCALC No results found for: TSH  Therapeutic Level Labs: No results found for: LITHIUM No results found for: VALPROATE No components found for:  CBMZ  Current Medications: Current Outpatient Prescriptions   Medication Sig Dispense Refill  . albuterol (PROAIR HFA) 108 (90 BASE) MCG/ACT inhaler Inhale 1-2 puffs into the lungs every 6 (six) hours as needed for wheezing or shortness of breath.    . clonazePAM (KLONOPIN) 1 MG tablet Take 1 tablet (1 mg total) by mouth 3 (three) times daily as needed for anxiety. 90 tablet 0  . Eluxadoline (VIBERZI) 75 MG TABS Take 75 mg by mouth 2 (two) times daily as needed.    Marland Kitchen escitalopram (LEXAPRO) 20 MG tablet Take 1 tablet (20 mg total) by mouth daily. 90 tablet 0  . Fish Oil-Cholecalciferol (FISH OIL + D3 PO) Take by mouth.    . fluticasone (FLONASE) 50 MCG/ACT nasal spray Place 1 spray into both nostrils daily.    . hydrOXYzine (VISTARIL) 25 MG capsule Take 1 capsule (25 mg total) by mouth at bedtime as needed. 90 capsule 0  . lithium 600 MG capsule Take 1 capsule (600 mg total) by mouth 2 (two) times daily with a meal. 180 capsule 0  . metFORMIN (GLUCOPHAGE) 500 MG tablet Take 500 mg by mouth 2 (two) times daily with a meal.    . metoprolol (LOPRESSOR) 50 MG tablet Take 50 mg by mouth 2 (two) times daily.    . mirtazapine (REMERON) 30 MG tablet Take 2 tablets (60 mg total) by mouth at bedtime. 180 tablet 0  . omeprazole (PRILOSEC) 40 MG capsule Take 40 mg by mouth daily.     No current facility-administered medications for this visit.      Musculoskeletal: Strength & Muscle Tone: within normal limits Gait & Station: normal Patient leans: N/A  Psychiatric Specialty Exam: Review of Systems  Constitutional: Positive for malaise/fatigue. Negative for chills and fever.  Musculoskeletal: Positive for myalgias.  Neurological: Positive for headaches. Negative for dizziness.  Psychiatric/Behavioral: Positive for depression. Negative for hallucinations, substance abuse and suicidal ideas. The patient is nervous/anxious. The patient does not have insomnia.     Blood pressure 130/86, pulse (!) 103, height 6' (1.829 m), weight 183 lb 9.6 oz (83.3 kg).Body mass  index is 24.9 kg/m.  General Appearance: Casual  Eye Contact:  Good  Speech:  Clear and Coherent and Normal Rate  Volume:  Normal  Mood:  Anxious and Depressed  Affect:  Congruent  Thought Process:  Goal Directed and Descriptions of Associations: Intact  Orientation:  Full (Time, Place, and Person)  Thought Content: Rumination   Suicidal Thoughts:  No  Homicidal Thoughts:  No  Memory:  Immediate;   Good Recent;   Good Remote;   Good  Judgement:  Good  Insight:  Good  Psychomotor Activity:  Normal  Concentration:  Concentration: Good and Attention Span: Good  Recall:  Good  Fund of Knowledge: Good  Language: Good  Akathisia:  No  Handed:  Right  AIMS (if indicated): not done  Assets:  Communication Skills Desire for Improvement Housing Social Support Transportation  ADL's:  Intact  Cognition: WNL  Sleep:  Fair   Screenings:   Assessment and Plan: PTSD; Bipolar II-depressed; GAD; Skin-picking disorder; Insomnia   Medication management with supportive therapy. Risks/benefits and SE of the medication discussed. Pt verbalized understanding and verbal consent obtained for treatment.  Affirm with the patient that the medications are taken as ordered. Patient expressed understanding of how their medications were to be used.   Meds:  Lithium 600mg  po BID for Bipolar II Klonopin 1mg  po TID for anxiety Remeron 60mg  po qHS for anxiety and depression D/c Vistaril  Start trial of Prazosin 1mg  po qHS for nightmares Lexapro 20mg  po qD for depression and anxiety   Labs: none  Therapy: brief supportive therapy provided. Discussed psychosocial stressors in detail.     Consultations: Encouraged to follow up with therapist Encouraged to follow up with PCP as needed  Pt denies SI and is at an acute low risk for suicide. Patient told to call clinic if any problems occur. Patient advised to go to ER if they should develop SI/HI, side effects, or if symptoms worsen. Has crisis  numbers to call if needed. Pt verbalized understanding.  F/up in 2 months or sooner if needed   Charlcie Cradle, MD 01/27/2017, 1:18 PM

## 2017-02-07 MED FILL — PRAZOSIN HCL 1 MG CAPS: 1 | 90 days supply | Qty: 90 | Fill #0

## 2017-02-07 MED FILL — clonazePAM 1 MG TABS: 1 | 30 days supply | Qty: 90 | Fill #0

## 2017-03-31 ENCOUNTER — Ambulatory Visit (HOSPITAL_COMMUNITY): Payer: Medicare PPO | Admitting: Psychiatry

## 2017-03-31 ENCOUNTER — Encounter (HOSPITAL_COMMUNITY): Payer: Self-pay | Admitting: Psychiatry

## 2017-03-31 DIAGNOSIS — F411 Generalized anxiety disorder: Secondary | ICD-10-CM

## 2017-03-31 DIAGNOSIS — F5105 Insomnia due to other mental disorder: Secondary | ICD-10-CM | POA: Diagnosis not present

## 2017-03-31 DIAGNOSIS — F4310 Post-traumatic stress disorder, unspecified: Secondary | ICD-10-CM | POA: Diagnosis not present

## 2017-03-31 DIAGNOSIS — F3181 Bipolar II disorder: Secondary | ICD-10-CM | POA: Diagnosis not present

## 2017-03-31 MED ORDER — MIRTAZAPINE 30 MG PO TABS: 60.0000 mg | ORAL_TABLET | Freq: Every day | ORAL | 0 refills | Status: DC

## 2017-03-31 MED ORDER — CLONAZEPAM 1 MG PO TABS: 1.0000 mg | ORAL_TABLET | Freq: Three times a day (TID) | ORAL | 0 refills | Status: DC | PRN

## 2017-03-31 MED ORDER — PRAZOSIN HCL 1 MG PO CAPS: 1.0000 mg | ORAL_CAPSULE | Freq: Every day | ORAL | 0 refills | Status: DC

## 2017-03-31 MED ORDER — LITHIUM CARBONATE 600 MG PO CAPS: 600.0000 mg | ORAL_CAPSULE | Freq: Two times a day (BID) | ORAL | 0 refills | Status: DC

## 2017-03-31 MED ORDER — ESCITALOPRAM OXALATE 20 MG PO TABS: 20.0000 mg | ORAL_TABLET | Freq: Every day | ORAL | 0 refills | Status: DC
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
## 2017-05-03 LAB — HM COLONOSCOPY

## 2017-06-01 ENCOUNTER — Encounter: Payer: Self-pay | Admitting: Family Medicine

## 2017-06-02 ENCOUNTER — Ambulatory Visit (HOSPITAL_COMMUNITY): Payer: Self-pay | Admitting: Psychiatry

## 2017-06-23 ENCOUNTER — Encounter: Payer: Self-pay | Admitting: Family Medicine

## 2017-07-07 ENCOUNTER — Encounter (HOSPITAL_COMMUNITY): Payer: Self-pay | Admitting: Psychiatry

## 2017-07-07 ENCOUNTER — Ambulatory Visit (HOSPITAL_COMMUNITY): Payer: Medicare PPO | Admitting: Psychiatry

## 2017-07-07 DIAGNOSIS — F515 Nightmare disorder: Secondary | ICD-10-CM

## 2017-07-07 DIAGNOSIS — Z811 Family history of alcohol abuse and dependence: Secondary | ICD-10-CM

## 2017-07-07 DIAGNOSIS — F5105 Insomnia due to other mental disorder: Secondary | ICD-10-CM

## 2017-07-07 DIAGNOSIS — F411 Generalized anxiety disorder: Secondary | ICD-10-CM

## 2017-07-07 DIAGNOSIS — F4310 Post-traumatic stress disorder, unspecified: Secondary | ICD-10-CM | POA: Diagnosis not present

## 2017-07-07 DIAGNOSIS — Z87891 Personal history of nicotine dependence: Secondary | ICD-10-CM

## 2017-07-07 DIAGNOSIS — Z813 Family history of other psychoactive substance abuse and dependence: Secondary | ICD-10-CM | POA: Diagnosis not present

## 2017-07-07 DIAGNOSIS — F3181 Bipolar II disorder: Secondary | ICD-10-CM | POA: Diagnosis not present

## 2017-07-07 DIAGNOSIS — Z818 Family history of other mental and behavioral disorders: Secondary | ICD-10-CM

## 2017-07-07 DIAGNOSIS — R450 Nervousness: Secondary | ICD-10-CM

## 2017-07-07 MED ORDER — MIRTAZAPINE 30 MG PO TABS: 60.0000 mg | ORAL_TABLET | Freq: Every day | ORAL | 0 refills | Status: DC

## 2017-07-07 MED ORDER — LITHIUM CARBONATE 600 MG PO CAPS
600.0000 mg | ORAL_CAPSULE | Freq: Two times a day (BID) | ORAL | 0 refills | Status: DC
Start: 2017-07-07 — End: 2017-10-06

## 2017-07-07 MED ORDER — CLONAZEPAM 1 MG PO TABS: 1.0000 mg | ORAL_TABLET | Freq: Three times a day (TID) | ORAL | 0 refills | Status: DC | PRN

## 2017-07-07 MED ORDER — PRAZOSIN HCL 1 MG PO CAPS: 1.0000 mg | ORAL_CAPSULE | Freq: Every day | ORAL | 0 refills | Status: DC

## 2017-07-07 MED ORDER — ESCITALOPRAM OXALATE 20 MG PO TABS: 20.0000 mg | ORAL_TABLET | Freq: Every day | ORAL | 0 refills | Status: DC

## 2017-07-07 MED FILL — LITHIUM CARBONATE 600 MG CA: 600 | 90 days supply | Qty: 180 | Fill #0

## 2017-07-07 MED FILL — ESCITALOPRAM 20 MG TABLET: 20 | 90 days supply | Qty: 90 | Fill #0

## 2017-07-07 MED FILL — clonazePAM 1 MG TABS: 1 | 30 days supply | Qty: 90 | Fill #0

## 2017-07-07 MED FILL — PRAZOSIN 1 MG CAPSULE: 1 | 90 days supply | Qty: 90 | Fill #0
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
## 2017-07-08 DIAGNOSIS — F4310 Post-traumatic stress disorder, unspecified: Secondary | ICD-10-CM | POA: Diagnosis not present

## 2017-07-11 DIAGNOSIS — F4310 Post-traumatic stress disorder, unspecified: Secondary | ICD-10-CM | POA: Diagnosis not present

## 2017-07-12 ENCOUNTER — Other Ambulatory Visit: Payer: Self-pay

## 2017-07-12 ENCOUNTER — Ambulatory Visit: Payer: Medicare PPO | Admitting: Family Medicine

## 2017-07-12 ENCOUNTER — Encounter: Payer: Self-pay | Admitting: Family Medicine

## 2017-07-12 VITALS — BP 122/84 | HR 69 | Temp 98.5°F | Resp 18 | Ht 72.0 in | Wt 179.2 lb

## 2017-07-12 DIAGNOSIS — K760 Fatty (change of) liver, not elsewhere classified: Secondary | ICD-10-CM | POA: Diagnosis not present

## 2017-07-12 DIAGNOSIS — E785 Hyperlipidemia, unspecified: Secondary | ICD-10-CM

## 2017-07-12 DIAGNOSIS — N029 Recurrent and persistent hematuria with unspecified morphologic changes: Secondary | ICD-10-CM | POA: Diagnosis not present

## 2017-07-12 DIAGNOSIS — G479 Sleep disorder, unspecified: Secondary | ICD-10-CM | POA: Diagnosis not present

## 2017-07-12 DIAGNOSIS — Z5181 Encounter for therapeutic drug level monitoring: Secondary | ICD-10-CM | POA: Diagnosis not present

## 2017-07-12 DIAGNOSIS — R5382 Chronic fatigue, unspecified: Secondary | ICD-10-CM | POA: Diagnosis not present

## 2017-07-12 DIAGNOSIS — Z23 Encounter for immunization: Secondary | ICD-10-CM

## 2017-07-12 DIAGNOSIS — E118 Type 2 diabetes mellitus with unspecified complications: Secondary | ICD-10-CM

## 2017-07-12 DIAGNOSIS — I10 Essential (primary) hypertension: Secondary | ICD-10-CM | POA: Diagnosis not present

## 2017-07-12 DIAGNOSIS — E559 Vitamin D deficiency, unspecified: Secondary | ICD-10-CM

## 2017-07-12 DIAGNOSIS — G4700 Insomnia, unspecified: Secondary | ICD-10-CM

## 2017-07-12 DIAGNOSIS — R0683 Snoring: Secondary | ICD-10-CM | POA: Diagnosis not present

## 2017-07-12 MED ORDER — ATORVASTATIN CALCIUM 40 MG PO TABS: 40.0000 mg | ORAL_TABLET | Freq: Every day | ORAL | 1 refills | Status: DC

## 2017-07-12 MED ORDER — LISINOPRIL 5 MG PO TABS: 5.0000 mg | ORAL_TABLET | Freq: Every day | ORAL | 1 refills | Status: DC

## 2017-07-12 MED FILL — ATORVASTATIN 40 MG TABLET: 40 | 90 days supply | Qty: 90 | Fill #0

## 2017-07-12 MED FILL — LISINOPRIL 5 MG TABLET: 5 | 90 days supply | Qty: 90 | Fill #0

## 2017-07-12 NOTE — Patient Instructions (Signed)
     IF you received an x-ray today, you will receive an invoice from [facility]. Please contact [facility] at [phone] with questions or concerns regarding your invoice.   IF you received labwork today, you will receive an invoice from [facility]. Please contact [facility] at [phone] with questions or concerns regarding your invoice.   Our billing staff will not be able to assist you with questions regarding bills from these companies.  You will be contacted with the lab results as soon as they are available. The fastest way to get your results is to activate your My Chart account. Instructions are located on the last page of this paperwork. If you have not heard from us regarding the results in 2 weeks, please contact this office.     

## 2017-07-12 NOTE — Progress Notes (Addendum)
By signing my name below, I, Matthew Ortiz, attest that this documentation has been prepared under the direction and in the presence of Matthew Ortiz Arrow, MD Electronically Signed: Theresia Ortiz, medical Scribe 07/12/17 at 10:52 AM  Subjective:    Patient ID: Matthew Ortiz, male    DOB: Feb 25, 1961, 57 y.o.   MRN: [id number] Chief Complaint  Patient presents with  . Establish Care  . Depression    Depression scale score 17   HPI Matthew Ortiz is a 56 y.o. male who presents to Primary Care at [facility] to establish care. He has a PMHx of DM, depression, and symptomatic BPH. He has seen Dr. [name], phychiatrist for his mood symptoms at [facility] and seen [facility] urology. He is also received care at the [place]. Recommended to have repeat sleep study to reassess for OSA. Last sleep study was 7 years ago with borderline results not meeting CPAP criteria. He also has PMHx of HTN and HLD.    06/23/17 Lab results from the [facility] Normal UA, Normal LFTs with total cholesterol 139. HDL: 40, LDL: 69, triglycerides were 152, and Non HDL: 99. PSA: 1.6. He is taking 1000 units a day of Vitamin D.   DM He takes Metformin 500 mg BID. He states his sugars have been in 95-115 fasting. He has been taking medication for the last 2.5-3 years.   HLD He takes fenofibrate 54 mg daily. He has never been on a statin before. He is agreeable to try Lipitor.   Urinary symptoms  Pt reports that he has urinary leaking, urinary spasms, urinary frequency and difficulty starting stream. He states that these symptoms were onset 1 month ago. He is being followed by [facility] urology and he reports they have not diagnosed him. He states he also has had blood in the urine. He denies any new medications; he is taking Flomax. Urology started him on Myrbetriq. He has not had a [place] of his kidneys.   Depression He takes 600 mg Lithium BID, clonazepam 1mg  tid,  and Lexapro 20 mg daily. He also takes Remeron at night. He is followed by Dr. [name]  at [facility].   Immunizations Pt has had the first component of the Shingles vaccine and is due for the second next month - made him feel flu-like for several days following. He has not had a PNA vaccine and is agreeable to receive this today.   Vision He has an eye exam scheduled for May 2019.   Sleep He is due for a sleep study. He states he use to sleep with oxygen but stopped two years ago.  Caused incredibly visit nightmares   Review of Systems  Eyes:       (+) glasses  Genitourinary: Positive for difficulty urinating, frequency, hematuria and urgency.    Current Outpatient Medications on File Prior to Visit  Medication Sig Dispense Refill  . albuterol (PROAIR HFA) 108 (90 BASE) MCG/ACT inhaler Inhale 1-2 puffs into the lungs every 6 (six) hours as needed for wheezing or shortness of breath.    . clonazePAM (KLONOPIN) 1 MG tablet Take 1 tablet (1 mg total) by mouth 3 (three) times daily as needed for anxiety. 90 tablet 0  . Eluxadoline (VIBERZI) 75 MG TABS Take 75 mg by mouth 2 (two) times daily as needed.    Marland Kitchen escitalopram (LEXAPRO) 20 MG tablet Take 1 tablet (20 mg total) by mouth daily. 90 tablet 0  . fenofibrate 54 MG tablet Take 54 mg by  mouth daily.    . Fish Oil-Cholecalciferol (FISH OIL + D3 PO) Take by mouth.    . fluticasone (FLONASE) 50 MCG/ACT nasal spray Place 1 spray into both nostrils daily.    Marland Kitchen lithium 600 MG capsule Take 1 capsule (600 mg total) by mouth 2 (two) times daily with a meal. 180 capsule 0  . metFORMIN (GLUCOPHAGE) 500 MG tablet Take 500 mg by mouth 2 (two) times daily with a meal.    . metoprolol (LOPRESSOR) 50 MG tablet Take 50 mg by mouth 2 (two) times daily.    . mirabegron ER (MYRBETRIQ) 25 MG TB24 tablet Take 25 mg by mouth daily.    . mirtazapine (REMERON) 30 MG tablet Take 2 tablets (60 mg total) by mouth at bedtime. 180 tablet 0  . omeprazole (PRILOSEC) 40 MG capsule Take 40 mg by mouth daily.    . prazosin (MINIPRESS) 1 MG capsule  Take 1 capsule (1 mg total) by mouth at bedtime. 90 capsule 0  . tamsulosin (FLOMAX) 0.4 MG CAPS capsule Take 0.4 mg by mouth daily.     No current facility-administered medications on file prior to visit.         Objective:   Physical Exam  Constitutional: He is oriented to person, place, and time. He appears well-developed and well-nourished. No distress.  HENT:  Head: Normocephalic and atraumatic.  Right Ear: Tympanic membrane and ear canal normal.  Left Ear: Tympanic membrane and ear canal normal.  Mouth/Throat: Uvula is midline and mucous membranes are normal.  Eyes: Conjunctivae are normal. Pupils are equal, round, and reactive to light. No scleral icterus.  Neck: Neck supple. No JVD present. Carotid bruit is not present.  Cardiovascular: Normal rate, regular rhythm, normal heart sounds and intact distal pulses.  No murmur heard. Pulmonary/Chest: Effort normal and breath sounds normal. No respiratory distress. He has no rales.  Abdominal: Soft. He exhibits no distension.  Musculoskeletal: He exhibits no edema.  Neurological: He is alert and oriented to person, place, and time. He has normal reflexes. No cranial nerve deficit.  Skin: Skin is warm and dry. He is not diaphoretic.  Psychiatric: He has a normal mood and affect. His behavior is normal.  Vitals reviewed.  Diabetic Foot Exam - Simple   Simple Foot Form Diabetic Foot exam was performed with the following findings:  Yes 07/12/2017 10:34 AM  Visual Inspection No deformities, no ulcerations, no other skin breakdown bilaterally:  Yes Sensation Testing Intact to touch and monofilament testing bilaterally:  Yes Pulse Check Posterior Tibialis and Dorsalis pulse intact bilaterally:  Yes Comments    EKG: NSR, no acute ischemic changes noted. No prior EKG comparrison.   I have personally reviewed the EKG tracing and agree with the computer interpretation.    Vitals:   07/12/17 0939  BP: 122/84  Pulse: 69  Resp: 18    Temp: 98.5 F (36.9 C)  TempSrc: Oral  SpO2: 96%  Weight: 179 lb 3.2 oz (81.3 kg)  Height: 6' (1.829 m)        Assessment & Plan:   1. Type 2 diabetes mellitus with complication, without long-term current use of insulin (HCC) - well controlled on metformin 500 bid. Has annual eye exam scheduled for 5/28 at [facility].  2. Hyperlipidemia LDL goal <100 - indicated to be on a statin due to DM - last lipid panel 3 weeks prior fantastic but will try stopping fenofibrate 54 and starting atorvastatin 40 - recheck lfts in 1 mo and  repeat lipids in ~4 mos.   3. Essential hypertension - well controlled w/o medication but will start low dose acei due to DM indication - recheck bmp at f/u.  I THINK HE IS NOT ON METOPROLOL 50 bid?? Check at f/u  4. Idiopathic hematuria, unspecified whether glomerular morphologic changes present - sudden onset of severe intermittent sxs with urinary retention and gross hematuria - seen by [facility] ~3 weeks prior on myrbetriq and flomax. I think he is being set up for cystoscopy (?) but reports he has not had a renal US yet so will check.  5. Medication monitoring encounter   6. Vitamin D deficiency - h/o low per pt, on 1000iu/d, recheck  7. Hepatic steatosis - per pt. LFTs 3 wks piror nml  8. Chronic fatigue - refer for sleep study  9. Snoring   10. Restless sleeper   11. Frequent nocturnal awakening    H/o ibs-D? Discuss at f/u.   Signed a release to get prior records from [facility] in [place], [place]. RTC in 4 weeks to review labs, [place], prior records, 2 new meds below - repeat cmp at f/u  Orders Placed This Encounter  Procedures  . US Renal    Standing Status:   Future    Standing Expiration Date:   09/12/2018    Order Specific Question:   Reason for Exam (SYMPTOM  OR DIAGNOSIS REQUIRED)    Answer:   intermittent hematuria and urinary frequency    Order Specific Question:   Preferred imaging location?    Answer:   [facility]  .  Pneumococcal polysaccharide vaccine 23-valent greater than or equal to 2yo subcutaneous/IM  . Hemoglobin A1c  . Microalbumin/Creatinine Ratio, Urine  . TSH  . CBC with Differential/Platelet  . Comprehensive metabolic panel  . Urinalysis, Routine w reflex microscopic  . Vitamin B12  . VITAMIN D 25 Hydroxy (Vit-D Deficiency, Fractures)  . Ambulatory referral to Sleep Studies    Referral Priority:   Routine    Referral Type:   Consultation    Referral Reason:   Specialty Services Required    Number of Visits Requested:   1  . EKG 12-Lead  . HM DIABETES FOOT EXAM    Meds ordered this encounter  Medications  . atorvastatin (LIPITOR) 40 MG tablet    Sig: Take 1 tablet (40 mg total) by mouth daily.    Dispense:  90 tablet    Refill:  1  . lisinopril (PRINIVIL,ZESTRIL) 5 MG tablet    Sig: Take 1 tablet (5 mg total) by mouth daily.    Dispense:  90 tablet    Refill:  1    I personally performed the services described in this documentation, which was scribed in my presence. The recorded information has been reviewed and considered, and addended by me as needed.   Delman Cheadle, M.D.  Primary Care at [facility] [address] [place], [place] [address] [phone] phone [phone] fax  07/12/17 11:09 AM

## 2017-07-13 LAB — COMPREHENSIVE METABOLIC PANEL
A/G RATIO: 1.9 (ref 1.2–2.2)
ALBUMIN: 5 g/dL (ref 3.5–5.5)
ALT: 24 IU/L (ref 0–44)
AST: 18 IU/L (ref 0–40)
Alkaline Phosphatase: 70 IU/L (ref 39–117)
BILIRUBIN TOTAL: 1.1 mg/dL (ref 0.0–1.2)
BUN / CREAT RATIO: 22 — AB (ref 9–20)
BUN: 20 mg/dL (ref 6–24)
CALCIUM: 10.1 mg/dL (ref 8.7–10.2)
CHLORIDE: 103 mmol/L (ref 96–106)
CO2: 19 mmol/L — ABNORMAL LOW (ref 20–29)
Creatinine, Ser: 0.93 mg/dL (ref 0.76–1.27)
GFR, EST AFRICAN AMERICAN: 104 mL/min/{1.73_m2} (ref >59)
GFR, EST NON AFRICAN AMERICAN: 90 mL/min/{1.73_m2} (ref >59)
Globulin, Total: 2.6 g/dL (ref 1.5–4.5)
Glucose: 105 mg/dL — ABNORMAL HIGH (ref 65–99)
Potassium: 4.9 mmol/L (ref 3.5–5.2)
Sodium: 138 mmol/L (ref 134–144)
TOTAL PROTEIN: 7.6 g/dL (ref 6.0–8.5)

## 2017-07-13 LAB — CBC WITH DIFFERENTIAL/PLATELET
BASOS: 1 %
Basophils Absolute: 0 10*3/uL (ref 0.0–0.2)
EOS (ABSOLUTE): 0.1 10*3/uL (ref 0.0–0.4)
EOS: 2 %
Hematocrit: 43.5 % (ref 37.5–51.0)
Hemoglobin: 14.8 g/dL (ref 13.0–17.7)
IMMATURE GRANS (ABS): 0 10*3/uL (ref 0.0–0.1)
IMMATURE GRANULOCYTES: 0 %
LYMPHS: 34 %
Lymphocytes Absolute: 1.6 10*3/uL (ref 0.7–3.1)
MCH: 29.5 pg (ref 26.6–33.0)
MCHC: 34 g/dL (ref 31.5–35.7)
MCV: 87 fL (ref 79–97)
Monocytes Absolute: 0.4 10*3/uL (ref 0.1–0.9)
Monocytes: 7 %
NEUTROS ABS: 2.7 10*3/uL (ref 1.4–7.0)
NEUTROS PCT: 56 %
PLATELETS: 326 10*3/uL (ref 150–379)
RBC: 5.02 x10E6/uL (ref 4.14–5.80)
RDW: 13.8 % (ref 12.3–15.4)
WBC: 4.8 10*3/uL (ref 3.4–10.8)

## 2017-07-13 LAB — URINALYSIS, ROUTINE W REFLEX MICROSCOPIC
BILIRUBIN UA: NEGATIVE
Glucose, UA: NEGATIVE
Ketones, UA: NEGATIVE
Leukocytes, UA: NEGATIVE
Nitrite, UA: NEGATIVE
PROTEIN UA: NEGATIVE
RBC UA: NEGATIVE
Specific Gravity, UA: 1.018 (ref 1.005–1.030)
UUROB: 0.2 mg/dL (ref 0.2–1.0)
pH, UA: 5.5 (ref 5.0–7.5)

## 2017-07-13 LAB — VITAMIN B12: Vitamin B-12: 408 pg/mL (ref 232–1245)

## 2017-07-13 LAB — HEMOGLOBIN A1C
Est. average glucose Bld gHb Est-mCnc: 111 mg/dL
Hgb A1c MFr Bld: 5.5 % (ref 4.8–5.6)

## 2017-07-13 LAB — MICROALBUMIN / CREATININE URINE RATIO
Creatinine, Urine: 91.8 mg/dL
MICROALBUM., U, RANDOM: 3.6 ug/mL
Microalb/Creat Ratio: 3.9 mg/g creat (ref 0.0–30.0)

## 2017-07-13 LAB — VITAMIN D 25 HYDROXY (VIT D DEFICIENCY, FRACTURES): Vit D, 25-Hydroxy: 36.9 ng/mL (ref 30.0–100.0)

## 2017-07-13 LAB — TSH: TSH: 2.49 u[IU]/mL (ref 0.450–4.500)

## 2017-07-14 DIAGNOSIS — F4310 Post-traumatic stress disorder, unspecified: Secondary | ICD-10-CM | POA: Diagnosis not present

## 2017-07-18 DIAGNOSIS — F4310 Post-traumatic stress disorder, unspecified: Secondary | ICD-10-CM | POA: Diagnosis not present

## 2017-07-21 ENCOUNTER — Ambulatory Visit
Admission: RE | Admit: 2017-07-21 | Discharge: 2017-07-21 | Disposition: A | Discharge status: Home / Self Care | Payer: Medicare PPO | Source: Ambulatory Visit | Attending: Family Medicine | Admitting: Family Medicine

## 2017-07-21 ENCOUNTER — Encounter (HOSPITAL_COMMUNITY): Payer: Self-pay | Admitting: Emergency Medicine

## 2017-07-21 ENCOUNTER — Ambulatory Visit (HOSPITAL_COMMUNITY)
Admission: EM | Admit: 2017-07-21 | Discharge: 2017-07-21 | Disposition: A | Discharge status: Home / Self Care | Payer: Medicare PPO | Attending: Family Medicine | Admitting: Family Medicine

## 2017-07-21 ENCOUNTER — Other Ambulatory Visit: Payer: Self-pay

## 2017-07-21 DIAGNOSIS — I10 Essential (primary) hypertension: Secondary | ICD-10-CM

## 2017-07-21 DIAGNOSIS — E1165 Type 2 diabetes mellitus with hyperglycemia: Secondary | ICD-10-CM | POA: Diagnosis not present

## 2017-07-21 DIAGNOSIS — R002 Palpitations: Secondary | ICD-10-CM

## 2017-07-21 DIAGNOSIS — R319 Hematuria, unspecified: Secondary | ICD-10-CM | POA: Diagnosis not present

## 2017-07-21 DIAGNOSIS — F4310 Post-traumatic stress disorder, unspecified: Secondary | ICD-10-CM | POA: Diagnosis not present

## 2017-07-21 DIAGNOSIS — N029 Recurrent and persistent hematuria with unspecified morphologic changes: Secondary | ICD-10-CM

## 2017-07-21 LAB — GLUCOSE, CAPILLARY: Glucose-Capillary: 125 mg/dL — ABNORMAL HIGH (ref 65–99)

## 2017-07-21 NOTE — Discharge Instructions (Signed)
Your ekg is reassuring here today and your heart rate and  blood pressure have normalized.  Please make appointment with your primary care doctor for recheck of symptoms as you may need cardiology follow up/referral. Or you may call cardiology independently to establish care.  If you develop worsening of symptoms, chest pain, palpitations, dizziness, light headedness, pass out, nausea, sweating or otherwise worsening please go to the Er.

## 2017-07-21 NOTE — ED Triage Notes (Signed)
Pt states he woke up in the middle of the night feeling like his heart was racing.  This morning he states his blood sugar was 160, which he says is very high for him and then at an appointment today he states his BP was 154/98 in one arm and 140/95 in the other arm.  Pt states he just doesn't feel right.

## 2017-07-21 NOTE — ED Provider Notes (Signed)
[place]    CSN: [id number] Arrival date & time: 07/21/17  1509     History   Chief Complaint Chief Complaint  Patient presents with  . Hypertension  . Hyperglycemia  . Palpitations    HPI Matthew Ortiz is a 57 y.o. male.   Matthew Ortiz presents with complaints of elevated blood sugar, palpitations and feeling generally unwell with some lightheadedness. This started this morning. He states he woke from sleep feeling palpitations and weak but went back to sleep. When he woke this morning his fasting blood sugar was elevated. He has had a mild headache all day. BP elevated at his appointments today, had renal ultrasound completed for hx of hematuria. Denies chest pain , shortness of breath , sweating, nausea, arm, jaw or back pain. States has had episodes like this in the past but has not been previously evaluated. States feels improved currently but still with mild headache. He has had a history of palpitations. Was previously on metoprolol, this was recently dc'd, and is now on lisinopril due to history of dm. History of htn, anxiety, asthma, COPD, palpitations, DM, gerd.    ROS per HPI.      Past Medical History:  Diagnosis Date  . Anxiety   . Asthma   . COPD (chronic obstructive pulmonary disease) ([place])   . Depression   . Diabetes mellitus, type II ([place])   . GERD (gastroesophageal reflux disease)   . HTN (hypertension)   . IBS (irritable bowel syndrome)   . Palpitations     Patient Active Problem List   Diagnosis Date Noted  . Hepatic steatosis 07/12/2017  . Vitamin D deficiency 07/12/2017  . Idiopathic hematuria 07/12/2017  . Essential hypertension 07/12/2017  . Hyperlipidemia LDL goal <100 07/12/2017  . Type 2 diabetes mellitus with complication, without long-term current use of insulin ([place]) 07/12/2017  . PTSD (post-traumatic stress disorder) 01/22/2014  . Insomnia related to another mental disorder 01/22/2014  . GAD (generalized anxiety disorder)  01/22/2014  . Skin-picking disorder 01/22/2014  . Bipolar 2 disorder, major depressive episode ([place]) 01/22/2014    Past Surgical History:  Procedure Laterality Date  . APPENDECTOMY    . FUNDOPLASTY TRANSTHORACIC    . HERNIA REPAIR    . RECONSTRUCTION OF NOSE         Home Medications    Prior to Admission medications   Medication Sig Start Date End Date Taking? Authorizing Provider  atorvastatin (LIPITOR) 40 MG tablet Take 1 tablet (40 mg total) by mouth daily. 07/12/17  Yes Shawnee Knapp, MD  clonazePAM (KLONOPIN) 1 MG tablet Take 1 tablet (1 mg total) by mouth 3 (three) times daily as needed for anxiety. 07/07/17  Yes Charlcie Cradle, MD  escitalopram (LEXAPRO) 20 MG tablet Take 1 tablet (20 mg total) by mouth daily. 07/07/17 07/07/18 Yes Charlcie Cradle, MD  Fish Oil-Cholecalciferol (FISH OIL + D3 PO) Take by mouth.   Yes [provider]  lisinopril (PRINIVIL,ZESTRIL) 5 MG tablet Take 1 tablet (5 mg total) by mouth daily. 07/12/17  Yes Shawnee Knapp, MD  lithium 600 MG capsule Take 1 capsule (600 mg total) by mouth 2 (two) times daily with a meal. 07/07/17  Yes Charlcie Cradle, MD  metFORMIN (GLUCOPHAGE) 500 MG tablet Take 500 mg by mouth 2 (two) times daily with a meal.   Yes Moshe Cipro, MD  mirabegron ER (MYRBETRIQ) 25 MG TB24 tablet Take 25 mg by mouth daily.   Yes [provider]  mirtazapine (REMERON)  30 MG tablet Take 2 tablets (60 mg total) by mouth at bedtime. 07/07/17 07/07/18 Yes Charlcie Cradle, MD  omeprazole (PRILOSEC) 40 MG capsule Take 40 mg by mouth daily.   Yes [provider]  prazosin (MINIPRESS) 1 MG capsule Take 1 capsule (1 mg total) by mouth at bedtime. 07/07/17  Yes Charlcie Cradle, MD  tamsulosin (FLOMAX) 0.4 MG CAPS capsule Take 0.4 mg by mouth daily.   Yes [provider]  albuterol (PROAIR HFA) 108 (90 BASE) MCG/ACT inhaler Inhale 1-2 puffs into the lungs every 6 (six) hours as needed for wheezing or shortness of breath.    [provider]  fluticasone (FLONASE) 50 MCG/ACT nasal spray Place 1 spray into both nostrils daily.    [provider]    Family History Family History  Problem Relation Age of Onset  . Depression Mother   . Anxiety disorder Mother   . Schizophrenia Father   . Suicidality Father   . Suicidality Paternal Grandfather   . Drug abuse Paternal Grandfather   . Alcohol abuse Paternal Grandfather     Social History Social History   Tobacco Use  . Smoking status: Former Smoker    Last attempt to quit: 05/03/1998    Years since quitting: 19.2  . Smokeless tobacco: Never Used  Substance Use Topics  . Alcohol use: No    Comment: socially-a few times a year  . Drug use: No    Types: Marijuana    Comment: last times was 1 yr ago     Allergies   Milk-related compounds and Penicillins   Review of Systems Review of Systems   Physical Exam Triage Vital Signs ED Triage Vitals [07/21/17 1541]  Enc Vitals Group     BP (!) 150/100     Pulse Rate (!) 111     Resp      Temp 98.3 F (36.8 C)     Temp Source Oral     SpO2 95 %     Weight      Height      Head Circumference      Peak Flow      Pain Score      Pain Loc      Pain Edu?      Excl. in [place]?    No data found.  Updated Vital Signs BP (!) 131/92 (BP Location: Left Arm)   Pulse (!) 101   Temp 99.1 F (37.3 C) (Oral)   SpO2 96%   Visual Acuity Right Eye Distance:   Left Eye Distance:   Bilateral Distance:    Right Eye Near:   Left Eye Near:    Bilateral Near:     Physical Exam  Constitutional: He is oriented to person, place, and time. He appears well-developed and well-nourished.  HENT:  Head: Normocephalic and atraumatic.  Eyes: Pupils are equal, round, and reactive to light. Conjunctivae and EOM are normal.  Neck: Normal range of motion.  Cardiovascular: Normal rate, regular rhythm and normal pulses.  Pulmonary/Chest: Effort normal and breath sounds normal.  Lymphadenopathy:    He has no  cervical adenopathy.  Neurological: He is alert and oriented to person, place, and time. No cranial nerve deficit. Coordination normal.  Skin: Skin is warm and dry.   EKG with NSR rate 83, without acute findings. Compared to EKG from 3/12.   UC Treatments / Results  Labs (all labs ordered are listed, but only abnormal results are displayed) Labs Reviewed  GLUCOSE, CAPILLARY - Abnormal; Notable for the following components:      Result Value   Glucose-Capillary 125 (*)    All other components within normal limits    EKG  EKG Interpretation None       Radiology No results found.  Procedures Procedures (including critical care time)  Medications Ordered in UC Medications - No data to display   Initial Impression / Assessment and Plan / UC Course  I have reviewed the triage vital signs and the nursing notes.  Pertinent labs & imaging results that were available during my care of the patient were reviewed by me and considered in my medical decision making (see chart for details).  Complete lab panel with TSH completed 3/12 and was WNL. Symptoms have improved. Blood sugar 125 this afternoon. HR improved with rest in clinic. EKG without acute findings. BP WNL after rest in clinic. Without chest pain, shortness of breath , nausea, back pain, arm pain, jaw pain. Has has similar symptoms in the past. Recommend follow up with PCP in the next week for recheck as may need cardiology referral. Return precautions provided. Patient verbalized understanding and agreeable to plan.  Ambulatory out of clinic without difficulty.    Case reviewed with supervising physician Dr. Mannie Stabile who is agreeable to plan.   Final Clinical Impressions(s) / UC Diagnoses   Final diagnoses:  Heart palpitations    ED Discharge Orders    None       Controlled Substance Prescriptions [place] Controlled Substance Registry consulted? Not Applicable   Zigmund Gottron, NP 07/21/17 1651

## 2017-07-25 DIAGNOSIS — F4310 Post-traumatic stress disorder, unspecified: Secondary | ICD-10-CM | POA: Diagnosis not present

## 2017-07-28 DIAGNOSIS — F4310 Post-traumatic stress disorder, unspecified: Secondary | ICD-10-CM | POA: Diagnosis not present

## 2017-08-01 DIAGNOSIS — F4310 Post-traumatic stress disorder, unspecified: Secondary | ICD-10-CM | POA: Diagnosis not present

## 2017-08-03 DIAGNOSIS — L905 Scar conditions and fibrosis of skin: Secondary | ICD-10-CM | POA: Diagnosis not present

## 2017-08-03 DIAGNOSIS — L821 Other seborrheic keratosis: Secondary | ICD-10-CM | POA: Diagnosis not present

## 2017-08-03 DIAGNOSIS — Z85828 Personal history of other malignant neoplasm of skin: Secondary | ICD-10-CM | POA: Diagnosis not present

## 2017-08-03 DIAGNOSIS — D1801 Hemangioma of skin and subcutaneous tissue: Secondary | ICD-10-CM | POA: Diagnosis not present

## 2017-08-04 DIAGNOSIS — F4310 Post-traumatic stress disorder, unspecified: Secondary | ICD-10-CM | POA: Diagnosis not present

## 2017-08-08 DIAGNOSIS — F4310 Post-traumatic stress disorder, unspecified: Secondary | ICD-10-CM | POA: Diagnosis not present

## 2017-08-11 ENCOUNTER — Encounter: Payer: Self-pay | Admitting: Family Medicine

## 2017-08-11 ENCOUNTER — Telehealth: Payer: Self-pay | Admitting: Family Medicine

## 2017-08-11 ENCOUNTER — Ambulatory Visit (INDEPENDENT_AMBULATORY_CARE_PROVIDER_SITE_OTHER): Payer: Medicare PPO | Admitting: Family Medicine

## 2017-08-11 VITALS — BP 116/70 | HR 96 | Temp 99.3°F | Resp 16 | Ht 72.0 in | Wt 177.6 lb

## 2017-08-11 DIAGNOSIS — Z87448 Personal history of other diseases of urinary system: Secondary | ICD-10-CM | POA: Diagnosis not present

## 2017-08-11 DIAGNOSIS — N401 Enlarged prostate with lower urinary tract symptoms: Secondary | ICD-10-CM | POA: Diagnosis not present

## 2017-08-11 DIAGNOSIS — R002 Palpitations: Secondary | ICD-10-CM

## 2017-08-11 DIAGNOSIS — R350 Frequency of micturition: Secondary | ICD-10-CM | POA: Diagnosis not present

## 2017-08-11 DIAGNOSIS — Z5181 Encounter for therapeutic drug level monitoring: Secondary | ICD-10-CM

## 2017-08-11 DIAGNOSIS — F4310 Post-traumatic stress disorder, unspecified: Secondary | ICD-10-CM | POA: Diagnosis not present

## 2017-08-11 DIAGNOSIS — N138 Other obstructive and reflux uropathy: Secondary | ICD-10-CM | POA: Diagnosis not present

## 2017-08-11 LAB — POCT URINALYSIS DIP (MANUAL ENTRY)
Bilirubin, UA: NEGATIVE
Blood, UA: NEGATIVE
Glucose, UA: NEGATIVE mg/dL
LEUKOCYTES UA: NEGATIVE
Nitrite, UA: NEGATIVE
PH UA: 5.5 (ref 5.0–8.0)
PROTEIN UA: NEGATIVE mg/dL
Spec Grav, UA: >=1.03 — AB (ref 1.010–1.025)
UROBILINOGEN UA: 0.2 U/dL

## 2017-08-11 LAB — POC MICROSCOPIC URINALYSIS (UMFC): Mucus: ABSENT

## 2017-08-11 MED ORDER — ALBUTEROL SULFATE HFA 108 (90 BASE) MCG/ACT IN AERS: 1.0000 | INHALATION_SPRAY | Freq: Four times a day (QID) | RESPIRATORY_TRACT | 3 refills | Status: DC | PRN

## 2017-08-11 MED ORDER — TAMSULOSIN HCL 0.4 MG PO CAPS: 0.4000 mg | ORAL_CAPSULE | Freq: Every day | ORAL | 1 refills | Status: DC

## 2017-08-11 MED ORDER — FLUTICASONE PROPIONATE 50 MCG/ACT NA SUSP: 1.0000 | Freq: Every day | NASAL | 5 refills | Status: DC

## 2017-08-11 MED FILL — TAMSULOSIN HCL 0.4 MG CAP: 0.4 | 90 days supply | Qty: 90 | Fill #0

## 2017-08-11 MED FILL — FLUTICASONE PROP 50 MCG SPR: 50 | 60 days supply | Qty: 16 | Fill #0

## 2017-08-11 MED FILL — VENTOLIN HFA 90 MCG INHALER: 108 (90 BAS | 25 days supply | Qty: 18 | Fill #0

## 2017-08-11 NOTE — Patient Instructions (Addendum)
IF you received an x-ray today, you will receive an invoice from [facility]. Please contact [facility] at [phone] with questions or concerns regarding your invoice.   IF you received labwork today, you will receive an invoice from [place]. Please contact [facility] at [phone] with questions or concerns regarding your invoice.   Our billing staff will not be able to assist you with questions regarding bills from these companies.  You will be contacted with the lab results as soon as they are available. The fastest way to get your results is to activate your My Chart account. Instructions are located on the last page of this paperwork. If you have not heard from [place] regarding the results in 2 weeks, please contact this office.      Cardiac Event Monitoring A cardiac event monitor is a small recording device that is used to detect abnormal heart rhythms (arrhythmias). The monitor is used to record your heart rhythm when you have symptoms, such as:  Fast heartbeats (palpitations), such as heart racing or fluttering.  Dizziness.  Fainting or light-headedness.  Unexplained weakness.  Some monitors are wired to electrodes placed on your chest. Electrodes are flat, sticky disks that attach to your skin. Other monitors may be hand-held or worn on the wrist. The monitor can be worn for up to 30 days. If the monitor is attached to your chest, a technician will prepare your chest for the electrode placement and show you how to work the monitor. Take time to practice using the monitor before you leave the office. Make sure you understand how to send the information from the monitor to your health care provider. In some cases, you may need to use a landline telephone instead of a cell phone. What are the risks? Generally, this device is safe to use, but it possible that the skin under the electrodes will become irritated. How to use your cardiac event monitor  Wear  your monitor at all times, except when you are in water: ? Do not let the monitor get wet. ? Take the monitor off when you bathe. Do not swim or use a hot tub with it on.  Keep your skin clean. Do not put body lotion or moisturizer on your chest.  Change the electrodes as told by your health care provider or any time they stop sticking to your skin. You may need to use medical tape to keep them on.  Try to put the electrodes in slightly different places on your chest to help prevent skin irritation. They must remain in the area under your left breast and in the upper right section of your chest.  Make sure the monitor is safely clipped to your clothing or in a location close to your body that your health care provider recommends.  Press the button to record as soon as you feel heart-related symptoms, such as: ? Dizziness. ? Weakness. ? Light-headedness. ? Palpitations. ? Thumping or pounding in your chest. ? Shortness of breath. ? Unexplained weakness.  Keep a diary of your activities, such as walking, doing chores, and taking medicine. It is very important to note what you were doing when you pushed the button to record your symptoms. This will help your health care provider determine what might be contributing to your symptoms.  Send the recorded information as recommended by your health care provider. It may take some time for your health care provider to process the results.  Change the batteries as told  by your health care provider.  Keep electronic devices away from your monitor. This includes: ? Tablets. ? MP3 players. ? Cell phones.  While wearing your monitor you should avoid: ? Electric blankets. ? Armed forces operational officer. ? Electric toothbrushes. ? Microwave ovens. ? Magnets. ? Metal detectors. Get help right away if:  You have chest pain.  You have extreme difficulty breathing or shortness of breath.  You develop a very fast heartbeat that persists.  You develop  dizziness that does not go away.  You faint or constantly feel like you are about to faint. Summary  A cardiac event monitor is a small recording device that is used to help detect abnormal heart rhythms (arrhythmias).  The monitor is used to record your heart rhythm when you have heart-related symptoms.  Make sure you understand how to send the information from the monitor to your health care provider.  It is important to press the button on the monitor when you have any heart-related symptoms.  Keep a diary of your activities, such as walking, doing chores, and taking medicine. It is very important to note what you were doing when you pushed the button to record your symptoms. This will help your health care provider learn what might be causing your symptoms. This information is not intended to replace advice given to you by your health care provider. Make sure you discuss any questions you have with your health care provider. Document Released: 01/27/2008 Document Revised: 04/03/2016 Document Reviewed: 04/03/2016 Elsevier Interactive Patient Education  2017 [employer].  Palpitations A palpitation is the feeling that your heartbeat is irregular or is faster than normal. It may feel like your heart is fluttering or skipping a beat. Palpitations are usually not a serious problem. They may be caused by many things, including smoking, caffeine, alcohol, stress, and certain medicines. Although most causes of palpitations are not serious, palpitations can be a sign of a serious medical problem. In some cases, you may need further medical evaluation. Follow these instructions at home: Pay attention to any changes in your symptoms. Take these actions to help with your condition:  Avoid the following: ? Caffeinated coffee, tea, soft drinks, diet pills, and energy drinks. ? Chocolate. ? Alcohol.  Do not use any tobacco products, such as cigarettes, chewing tobacco, and e-cigarettes. If you  need help quitting, ask your health care provider.  Try to reduce your stress and anxiety. Things that can help you relax include: ? Yoga. ? Meditation. ? Physical activity, such as swimming, jogging, or walking. ? Biofeedback. This is a method that helps you learn to use your mind to control things in your body, such as your heartbeats.  Get plenty of rest and sleep.  Take over-the-counter and prescription medicines only as told by your health care provider.  Keep all follow-up visits as told by your health care provider. This is important.  Contact a health care provider if:  You continue to have a fast or irregular heartbeat after 24 hours.  Your palpitations occur more often. Get help right away if:  You have chest pain or shortness of breath.  You have a severe headache.  You feel dizzy or you faint. This information is not intended to replace advice given to you by your health care provider. Make sure you discuss any questions you have with your health care provider. Document Released: 04/16/2000 Document Revised: 09/22/2015 Document Reviewed: 01/02/2015 Elsevier Interactive Patient Education  [employer].

## 2017-08-11 NOTE — Telephone Encounter (Signed)
PATIENT WAS SEEN TODAY (08/11/17) BY DR. SHAW. I SCHEDULED HIM FOR A 3 MONTH APPOINTMENT ON 10/31/2017. HIS APPOINTMENT TIME IS 2:00pm. DR. Brigitte Pulse WANTS HIM TO HAVE FASTING LABS. I CALLED Matthew Ortiz TO SEE IF HE COULD COME BY EARLY ON Monday 7/1 JUST TO HAVE HIS BLOOD DRAWN SO HE CAN EAT. I HAD TO LEAVE HIM A MESSAGE. WHEN HE CALLS BACK PLEASE ASK HIM IF HE WANTS TO MAKE A SOONER APPOINTMENT TO HAVE DR. SHAW CHECK THE CYST ON HIS (L) FOOT? [place]

## 2017-08-11 NOTE — Progress Notes (Signed)
Subjective:  By signing my name below, I, Essence Howell, attest that this documentation has been prepared under the direction and in the presence of Delman Cheadle, MD Electronically Signed: Ladene Artist, ED Scribe 08/11/2017 at 2:39 PM.   Patient ID: Matthew Ortiz, male    DOB: June 07, 1960, 57 y.o.   MRN: [id number]  Chief Complaint  Patient presents with  . Hematuria  . Results    Renal US   . Follow-up   HPI Matthew Ortiz is a 57 y.o. male who presents to Primary Care at [facility] for f/u. Pt was seen at urgent care on 3/21 for heart palpitations, elevated blood glucose, mild HA, fatigue and lightheadedness upon waking. Denies symptoms since. He was previously on metoprolol which was recently dc'd and started on lisinopril due to h/o DM.  Pt states that he was started on metoprolol 2 25 mg bid ~5 yrs ago. He does report a sensation that he describes as a "fish flopping around in his chest" this is the most noticeable when he tries to relax and lay down. Denies cp or radiating symptoms. States he has cut back on coffee by switching to decaf. He has not seen a cardiologist in the past. Denies any side-effects from new medication.  GU Does report some urinary frequency and leakage that he attributes to age. Denies spasms or urinary retention. Would like to stop merbetriq due to cost but wants to continue Flomax, which he is unsure works as he is afraid to stop. He is also taking sildenafil.  Past Medical History:  Diagnosis Date  . Anxiety   . Asthma   . COPD (chronic obstructive pulmonary disease) ([place])   . Depression   . Diabetes mellitus, type II ([place])   . GERD (gastroesophageal reflux disease)   . HTN (hypertension)   . IBS (irritable bowel syndrome)   . Palpitations    Current Outpatient Medications on File Prior to Visit  Medication Sig Dispense Refill  . albuterol (PROAIR HFA) 108 (90 BASE) MCG/ACT inhaler Inhale 1-2 puffs into the lungs every 6 (six) hours as needed for wheezing  or shortness of breath.    Marland Kitchen atorvastatin (LIPITOR) 40 MG tablet Take 1 tablet (40 mg total) by mouth daily. 90 tablet 1  . clonazePAM (KLONOPIN) 1 MG tablet Take 1 tablet (1 mg total) by mouth 3 (three) times daily as needed for anxiety. 90 tablet 0  . escitalopram (LEXAPRO) 20 MG tablet Take 1 tablet (20 mg total) by mouth daily. 90 tablet 0  . Fish Oil-Cholecalciferol (FISH OIL + D3 PO) Take by mouth.    . fluticasone (FLONASE) 50 MCG/ACT nasal spray Place 1 spray into both nostrils daily.    Marland Kitchen lisinopril (PRINIVIL,ZESTRIL) 5 MG tablet Take 1 tablet (5 mg total) by mouth daily. 90 tablet 1  . lithium 600 MG capsule Take 1 capsule (600 mg total) by mouth 2 (two) times daily with a meal. 180 capsule 0  . mirtazapine (REMERON) 30 MG tablet Take 2 tablets (60 mg total) by mouth at bedtime. 180 tablet 0  . omeprazole (PRILOSEC) 40 MG capsule Take 40 mg by mouth daily.    . tamsulosin (FLOMAX) 0.4 MG CAPS capsule Take 0.4 mg by mouth daily.    . traZODone (DESYREL) 100 MG tablet Take by mouth.    . venlafaxine (EFFEXOR) 100 MG tablet Take by mouth.     No current facility-administered medications on file prior to visit.    Allergies  Allergen Reactions  .  Bee Pollen   . Milk-Related Compounds Diarrhea  . Penicillins Rash   Review of Systems  Cardiovascular: Positive for palpitations. Negative for chest pain.      Objective:   Physical Exam  Constitutional: He is oriented to person, place, and time. He appears well-developed and well-nourished. No distress.  HENT:  Head: Normocephalic and atraumatic.  Eyes: Conjunctivae and EOM are normal.  Neck: Neck supple. No tracheal deviation present.  Cardiovascular: Normal rate, regular rhythm and normal heart sounds.  Pulmonary/Chest: Effort normal and breath sounds normal. No respiratory distress.  Musculoskeletal: Normal range of motion.  Neurological: He is alert and oriented to person, place, and time.  Skin: Skin is warm and dry.    Psychiatric: He has a normal mood and affect. His behavior is normal.  Nursing note and vitals reviewed.  BP 116/70   Pulse 96   Temp 99.3 F (37.4 C)   Resp 16   Ht 6' (1.829 m)   Wt 177 lb 9.6 oz (80.6 kg)   SpO2 96%   BMI 24.09 kg/m      Assessment & Plan:   1. Medication monitoring encounter   2. Intermittent palpitations   3. History of hematuria   4. BPH with obstruction/lower urinary tract symptoms   5. Urinary frequency     Orders Placed This Encounter  Procedures  . Comprehensive metabolic panel  . Cardiac event monitor    Standing Status:   Future    Number of Occurrences:   1    Standing Expiration Date:   08/12/2018    Order Specific Question:   Where should this test be performed?    Answer:   [facility]  . POCT urinalysis dipstick  . POCT Microscopic Urinalysis ([facility])    Meds ordered this encounter  Medications  . fluticasone (FLONASE) 50 MCG/ACT nasal spray    Sig: Place 1 spray into both nostrils daily.    Dispense:  16 g    Refill:  5  . albuterol (PROAIR HFA) 108 (90 Base) MCG/ACT inhaler    Sig: Inhale 1-2 puffs into the lungs every 6 (six) hours as needed for wheezing or shortness of breath.    Dispense:  1 Inhaler    Refill:  3  . tamsulosin (FLOMAX) 0.4 MG CAPS capsule    Sig: Take 1 capsule (0.4 mg total) by mouth daily.    Dispense:  180 capsule    Refill:  1    I personally performed the services described in this documentation, which was scribed in my presence. The recorded information has been reviewed and considered, and addended by me as needed.   Delman Cheadle, M.D.  Primary Care at [facility] [address] [place], [place] [address] [phone] phone [phone] fax  10/31/17 1:13 PM

## 2017-08-12 LAB — COMPREHENSIVE METABOLIC PANEL
A/G RATIO: 2 (ref 1.2–2.2)
ALT: 21 IU/L (ref 0–44)
AST: 15 IU/L (ref 0–40)
Albumin: 4.5 g/dL (ref 3.5–5.5)
Alkaline Phosphatase: 79 IU/L (ref 39–117)
BILIRUBIN TOTAL: 1.5 mg/dL — AB (ref 0.0–1.2)
BUN / CREAT RATIO: 16 (ref 9–20)
BUN: 14 mg/dL (ref 6–24)
CHLORIDE: 105 mmol/L (ref 96–106)
CO2: 22 mmol/L (ref 20–29)
Calcium: 9.5 mg/dL (ref 8.7–10.2)
Creatinine, Ser: 0.87 mg/dL (ref 0.76–1.27)
GFR calc Af Amer: 110 mL/min/{1.73_m2} (ref >59)
GFR calc non Af Amer: 95 mL/min/{1.73_m2} (ref >59)
GLOBULIN, TOTAL: 2.3 g/dL (ref 1.5–4.5)
Glucose: 107 mg/dL — ABNORMAL HIGH (ref 65–99)
POTASSIUM: 4.2 mmol/L (ref 3.5–5.2)
SODIUM: 141 mmol/L (ref 134–144)
Total Protein: 6.8 g/dL (ref 6.0–8.5)

## 2017-08-15 DIAGNOSIS — F4310 Post-traumatic stress disorder, unspecified: Secondary | ICD-10-CM | POA: Diagnosis not present

## 2017-08-18 DIAGNOSIS — F4310 Post-traumatic stress disorder, unspecified: Secondary | ICD-10-CM | POA: Diagnosis not present

## 2017-08-22 DIAGNOSIS — F4310 Post-traumatic stress disorder, unspecified: Secondary | ICD-10-CM | POA: Diagnosis not present

## 2017-08-25 DIAGNOSIS — F4310 Post-traumatic stress disorder, unspecified: Secondary | ICD-10-CM | POA: Diagnosis not present

## 2017-08-26 ENCOUNTER — Encounter: Payer: Self-pay | Admitting: Family Medicine

## 2017-08-26 ENCOUNTER — Ambulatory Visit (INDEPENDENT_AMBULATORY_CARE_PROVIDER_SITE_OTHER): Payer: Medicare PPO

## 2017-08-26 DIAGNOSIS — R002 Palpitations: Secondary | ICD-10-CM | POA: Diagnosis not present

## 2017-08-29 DIAGNOSIS — F4310 Post-traumatic stress disorder, unspecified: Secondary | ICD-10-CM | POA: Diagnosis not present

## 2017-08-30 ENCOUNTER — Ambulatory Visit (INDEPENDENT_AMBULATORY_CARE_PROVIDER_SITE_OTHER): Payer: Medicare PPO | Admitting: Neurology

## 2017-08-30 ENCOUNTER — Encounter: Payer: Self-pay | Admitting: Neurology

## 2017-08-30 VITALS — BP 113/79 | HR 80 | Ht 72.0 in | Wt 175.5 lb

## 2017-08-30 DIAGNOSIS — F419 Anxiety disorder, unspecified: Secondary | ICD-10-CM

## 2017-08-30 DIAGNOSIS — F324 Major depressive disorder, single episode, in partial remission: Secondary | ICD-10-CM

## 2017-08-30 DIAGNOSIS — F329 Major depressive disorder, single episode, unspecified: Secondary | ICD-10-CM | POA: Diagnosis not present

## 2017-08-30 DIAGNOSIS — F32A Depression, unspecified: Secondary | ICD-10-CM | POA: Insufficient documentation

## 2017-08-30 DIAGNOSIS — R0683 Snoring: Secondary | ICD-10-CM | POA: Diagnosis not present

## 2017-08-30 DIAGNOSIS — J449 Chronic obstructive pulmonary disease, unspecified: Secondary | ICD-10-CM

## 2017-08-30 DIAGNOSIS — F5104 Psychophysiologic insomnia: Secondary | ICD-10-CM | POA: Diagnosis not present

## 2017-08-30 DIAGNOSIS — Z79899 Other long term (current) drug therapy: Secondary | ICD-10-CM

## 2017-08-30 HISTORY — DX: Major depressive disorder, single episode, in partial remission: F32.4

## 2017-08-30 NOTE — Addendum Note (Signed)
Addended by: Larey Seat on: 08/30/2017 01:52 PM   Modules accepted: Orders

## 2017-08-30 NOTE — Patient Instructions (Signed)

## 2017-08-30 NOTE — Progress Notes (Signed)
SLEEP MEDICINE CLINIC   Provider:  Larey Seat, [place] D  Primary Care Physician:  Shawnee Knapp, MD   Referring Provider: Shawnee Knapp, MD    Chief Complaint  Patient presents with  . New Patient (Initial Visit)    Room 10. Pt is alone.     HPI:  Matthew Ortiz is a 57 y.o. male patient , seen here as in a referral from Dr. Brigitte Pulse for a sleep study evaluation.  Chief complaint according to patient : excessive daytime fatigue ans sleepiness.  Matthew Ortiz is a new patient to my sleep clinic, and reports that he was evaluated for sleep apnea several years ago but the study results were inconclusive.  He may have had mild apnea but it was deemed not significant enough clinically significant enough to initiate any therapy.  He had however the diagnosis of nocturnal hypoxemia was placed on oxygen.  He used oxygen by 2 L/min nasal cannula overnight but many if not the majority of nights he found the cannula next to him in bed and not in its proper place.  2 years ago he decided to discontinue the oxygen treatment.  In the meantime his wife has told him that he snores, he wakes up frequently but is a very dry mouth and parched tongue, and he reports some microsleep attacks, palpitations ( is on a monitor for 30 days right now),  and daytime fatigue , EDS excessive daytime sleepiness, the need for naps.  Sleep habits are as follows: Patient usually watches TV or mostly listens to be TV in the background for several hours before he goes to bed, he will find himself and comfortable clothing, he does not like to read before he goes to bed.  Once he is able to go to the bedroom the bedroom is described as cool, quiet and dark.  He sleeps usually supine, on 2 pillows. He is particular about his bedding, prefers a certain blanket.  Whilst on medication to help him to go to sleep he still struggles. Average sleep latency 30-90 minutes.  He was dreaming a lot while on oxygen. Not now.  He has 2-3 nocturias each  night, often difficulties to return to sleep.  There is also knee pain. He sleeps alone, wife snores.  Wakes up spontaneously around 4-5 AM, rises at 6. 30 AM - the dogs get up. He naps frequently, has the irresistible urge to sleep, just for a moment. weekend routine is different , grocery store shopping, wife works [employer] infant care.    Sleep medical history and family sleep history:  Sleep hypoxemia, depression. Anxiety, other listed diagnoses are diabetes mellitus type 2 without insulin use, vitamin D deficiency, hepatic steatosis, chronic fatigue, snoring, restless sleep frequent nocturnal awakenings.  A sister has been diagnosed with cancer, brother diabetes, mother had advanced brain atrophy before she passed, father suffered from COPD/ emphysema.   Social history:  Non smoker, quit age 5 after 43 pack years- non drinker, seldom ETOH,  Caffeine :  decaffeinated coffee, one diet dr. Malachi Bonds a day, no iced tea.   No shift work history, retired [employer] logistics/support/administrative officer. [place].  Married, 2 dogs, no children. One dog sleeps in the same bedroom.  Review of Systems: Out of a complete 14 system review, the patient complains of only the following symptoms, and all other reviewed systems are negative.  See above .  Epworth Sleepiness score 10/ , Fatigue severity score 36  , depression score 7/ 15  Social History   Socioeconomic History  . Marital status: Married    Spouse name: Not on file  . Number of children: Not on file  . Years of education: Not on file  . Highest education level: Not on file  Occupational History  . Not on file  Social Needs  . Financial resource strain: Not on file  . Food insecurity:    Worry: Not on file    Inability: Not on file  . Transportation needs:    Medical: Not on file    Non-medical: Not on file  Tobacco Use  . Smoking status: Former Smoker    Last attempt to quit: 05/03/1998    Years since quitting: 19.3  . Smokeless tobacco: Never Used  Substance and  Sexual Activity  . Alcohol use: No    Comment: socially-a few times a year  . Drug use: No    Types: Marijuana    Comment: last times was 1 yr ago  . Sexual activity: Never  Lifestyle  . Physical activity:    Days per week: Not on file    Minutes per session: Not on file  . Stress: Not on file  Relationships  . Social connections:    Talks on phone: Not on file    Gets together: Not on file    Attends religious service: Not on file    Active member of club or organization: Not on file    Attends meetings of clubs or organizations: Not on file    Relationship status: Not on file  . Intimate partner violence:    Fear of current or ex partner: Not on file    Emotionally abused: Not on file    Physically abused: Not on file    Forced sexual activity: Not on file  Other Topics Concern  . Not on file  Social History Narrative  . Not on file    Family History  Problem Relation Age of Onset  . Depression Mother   . Anxiety disorder Mother   . Schizophrenia Father   . Suicidality Father   . Suicidality Paternal Grandfather   . Drug abuse Paternal Grandfather   . Alcohol abuse Paternal Grandfather     Past Medical History:  Diagnosis Date  . Anxiety   . Asthma   . COPD (chronic obstructive pulmonary disease) ([place])   . Depression   . Diabetes mellitus, type II ([place])   . GERD (gastroesophageal reflux disease)   . HTN (hypertension)   . IBS (irritable bowel syndrome)   . Palpitations     Past Surgical History:  Procedure Laterality Date  . APPENDECTOMY    . FUNDOPLASTY TRANSTHORACIC    . HERNIA REPAIR    . RECONSTRUCTION OF NOSE      Current Outpatient Medications  Medication Sig Dispense Refill  . albuterol (PROAIR HFA) 108 (90 Base) MCG/ACT inhaler Inhale 1-2 puffs into the lungs every 6 (six) hours as needed for wheezing or shortness of breath. 1 Inhaler 3  . atorvastatin (LIPITOR) 40 MG tablet Take 1 tablet (40 mg total) by mouth daily. 90 tablet 1  .  clonazePAM (KLONOPIN) 1 MG tablet Take 1 tablet (1 mg total) by mouth 3 (three) times daily as needed for anxiety. 90 tablet 0  . escitalopram (LEXAPRO) 20 MG tablet Take 1 tablet (20 mg total) by mouth daily. 90 tablet 0  . Fish Oil-Cholecalciferol (FISH OIL + D3 PO) Take by mouth.    . fluticasone (FLONASE) 50  MCG/ACT nasal spray Place 1 spray into both nostrils daily. 16 g 5  . lisinopril (PRINIVIL,ZESTRIL) 5 MG tablet Take 1 tablet (5 mg total) by mouth daily. 90 tablet 1  . lithium 600 MG capsule Take 1 capsule (600 mg total) by mouth 2 (two) times daily with a meal. 180 capsule 0  . mirtazapine (REMERON) 30 MG tablet Take 2 tablets (60 mg total) by mouth at bedtime. 180 tablet 0  . omeprazole (PRILOSEC) 40 MG capsule Take 40 mg by mouth daily.    . tamsulosin (FLOMAX) 0.4 MG CAPS capsule Take 1 capsule (0.4 mg total) by mouth daily. 180 capsule 1  . traZODone (DESYREL) 100 MG tablet Take by mouth.     No current facility-administered medications for this visit.     Allergies as of 08/30/2017 - Review Complete 08/30/2017  Allergen Reaction Noted  . Bee pollen  04/02/2011  . Milk-related compounds Diarrhea 01/22/2014  . Penicillins Rash 01/22/2014    Vitals: BP 113/79   Pulse 80   Ht 6' (1.829 m)   Wt 175 lb 8 oz (79.6 kg)   BMI 23.80 kg/m  Last Weight:  Wt Readings from Last 1 Encounters:  08/30/17 175 lb 8 oz (79.6 kg)   JME:QAST mass index is 23.8 kg/m.     Last Height:   Ht Readings from Last 1 Encounters:  08/30/17 6' (1.829 m)    Physical exam:  General: The patient is awake, alert and appears not in acute distress. The patient is well groomed. Head: Normocephalic, atraumatic. Neck is supple. Mallampati 2.,  neck circumference:15. 25 . Nasal airflow patent - TMJ is evident . Retrognathia is seen.  Cardiovascular:  Regular rate and rhythm , without  murmurs or carotid bruit, and without distended neck veins. Respiratory: Lungs are clear to auscultation. Skin:   Without evidence of edema, or rash Trunk: BMI is 23. 8 . The patient's posture is erect.   Neurologic exam : The patient is awake and alert, oriented to place and time.   Memory subjective described as intact.  Attention span & concentration ability appears normal.  Speech is fluent,  without dysarthria, dysphonia or aphasia.  Mood and affect are appropriate.  Cranial nerves: Pupils are equal and briskly reactive to light. Funduscopic exam without evidence of pallor or edema.  Extraocular movements  in vertical and horizontal planes intact and without nystagmus. Visual fields by finger perimetry are intact. Hearing to finger rub intact.  Facial sensation intact to fine touch. Facial motor strength is symmetric and tongue and uvula move midline. Shoulder shrug was symmetrical. Motor exam: Normal tone, muscle bulk and symmetric strength in all extremities. Sensory:  Fine touch, pinprick and vibration were tested in all extremities. Proprioception tested in the upper extremities was normal. Coordination: Rapid alternating movements in the fingers/hands was normal. Finger-to-nose maneuver  normal without evidence of ataxia, dysmetria or tremor. Gait and station: Patient walks without assistive device and is able unassisted to climb up to the exam table. Strength within normal limits. Stance is stable and normal.  Turns with  3 Steps. Deep tendon reflexes: in the  upper and lower extremities are symmetric and intact.   Assessment:  After physical and neurologic examination, review of laboratory studies,  Personal review of imaging studies, reports of other /same  Imaging studies, results of polysomnography and / or neurophysiology testing and pre-existing records as far as provided in visit., my assessment is   1) Matthew Ortiz carries a note for  the specified diagnosis of nocturnal hypoxemia, this could be related to what is listed in his medical history and current diagnosis list COPD, however he has  never recall being diagnosed as such.  He also has not had pulmonary function tests.  He is a former smoker..  I would like for him to have a pulmonary full evaluation of his pulmonary function tests and will make the appropriate referral.  2) be observed snoring and frequent sleep fragmentation by nocturia but also by discomfort joint pain etc. all have left to a low quality sleep that is not as refreshing restoring as it should be.  Overnight he may get 6 hours of sleep but often less.  There is chronic insomnia likely also related to her underlying anxiety-depression disorder.  The palpitations have been already addressed by a cardiac monitor this will further differentiate between to cardiac palpitations or relation to a panic attack etc.   3) Matthew Ortiz does not have many physical hallmarks of a sleep apnea patient except for the witnessed snoring, he is usually not short of breath at rest.  He does wake up with a very dry mouth which supports that he is a mouth breather and likely snoring, but would not indicate that he has to have apnea.  Given that he has a diagnosis of hypoxemia years ago and until 2 years ago had nocturnal oxygen available I would like to invite him for an attended sleep study was a goal to determine if obstructive sleep apnea is present, if he can confirm nocturnal cardiac abnormalities, and I would also like to see if he has hypoxemia.  I will order a split-night polysomnography to be converted to a CPAP titration so the patient has an AHI of 20 or higher.  4) Insomnia- a spoke in detail about sleep hygiene and routines, th patient received a hand out.    The patient was advised of the nature of the diagnosed disorder , the treatment options and the  risks for general health and wellness arising from not treating the condition.   I spent more than 45 minutes of face to face time with the patient.  Greater than 50% of time was spent in counseling and coordination of care. We  have discussed the diagnosis and differential and I answered the patient's questions.    Plan:  Treatment plan and additional workup :  SPLIT at 20 AHI, watch carefully for hypoxemia / establish if oxygen is needed and apply if needed.   RV after wards with MD.   Larey Seat, MD 1/61/0960, 4:54 PM  Certified in Neurology by ABPN Certified in Sleep Medicine by [facility] [address] [place], [place] [address]

## 2017-09-05 DIAGNOSIS — F4310 Post-traumatic stress disorder, unspecified: Secondary | ICD-10-CM | POA: Diagnosis not present

## 2017-09-08 DIAGNOSIS — F4310 Post-traumatic stress disorder, unspecified: Secondary | ICD-10-CM | POA: Diagnosis not present

## 2017-09-12 DIAGNOSIS — F4310 Post-traumatic stress disorder, unspecified: Secondary | ICD-10-CM | POA: Diagnosis not present

## 2017-09-15 DIAGNOSIS — F4310 Post-traumatic stress disorder, unspecified: Secondary | ICD-10-CM | POA: Diagnosis not present

## 2017-09-19 DIAGNOSIS — F4310 Post-traumatic stress disorder, unspecified: Secondary | ICD-10-CM | POA: Diagnosis not present

## 2017-09-22 ENCOUNTER — Ambulatory Visit (INDEPENDENT_AMBULATORY_CARE_PROVIDER_SITE_OTHER): Payer: Medicare PPO | Admitting: Neurology

## 2017-09-22 DIAGNOSIS — F419 Anxiety disorder, unspecified: Secondary | ICD-10-CM

## 2017-09-22 DIAGNOSIS — R0683 Snoring: Secondary | ICD-10-CM

## 2017-09-22 DIAGNOSIS — F329 Major depressive disorder, single episode, unspecified: Secondary | ICD-10-CM

## 2017-09-22 DIAGNOSIS — G4710 Hypersomnia, unspecified: Secondary | ICD-10-CM | POA: Diagnosis not present

## 2017-09-22 DIAGNOSIS — Z79899 Other long term (current) drug therapy: Secondary | ICD-10-CM

## 2017-09-22 DIAGNOSIS — F4310 Post-traumatic stress disorder, unspecified: Secondary | ICD-10-CM | POA: Diagnosis not present

## 2017-09-22 DIAGNOSIS — F324 Major depressive disorder, single episode, in partial remission: Secondary | ICD-10-CM

## 2017-09-22 DIAGNOSIS — F5104 Psychophysiologic insomnia: Secondary | ICD-10-CM

## 2017-09-22 DIAGNOSIS — J449 Chronic obstructive pulmonary disease, unspecified: Secondary | ICD-10-CM

## 2017-09-23 ENCOUNTER — Ambulatory Visit (INDEPENDENT_AMBULATORY_CARE_PROVIDER_SITE_OTHER)
Admission: RE | Admit: 2017-09-23 | Discharge: 2017-09-23 | Disposition: A | Discharge status: Home / Self Care | Payer: Medicare PPO | Source: Ambulatory Visit | Attending: Pulmonary Disease | Admitting: Pulmonary Disease

## 2017-09-23 ENCOUNTER — Encounter: Payer: Self-pay | Admitting: Pulmonary Disease

## 2017-09-23 ENCOUNTER — Ambulatory Visit: Payer: Medicare PPO | Admitting: Pulmonary Disease

## 2017-09-23 VITALS — BP 104/80 | HR 89 | Ht 72.0 in | Wt 177.0 lb

## 2017-09-23 DIAGNOSIS — R0602 Shortness of breath: Secondary | ICD-10-CM

## 2017-09-23 DIAGNOSIS — R05 Cough: Secondary | ICD-10-CM

## 2017-09-23 DIAGNOSIS — R059 Cough, unspecified: Secondary | ICD-10-CM

## 2017-09-23 DIAGNOSIS — J4530 Mild persistent asthma, uncomplicated: Secondary | ICD-10-CM | POA: Diagnosis not present

## 2017-09-23 LAB — NITRIC OXIDE: NITRIC OXIDE: 29

## 2017-09-23 MED ORDER — AEROCHAMBER PLUS FLO-VU SMALL MISC: 1.0000 | Freq: Once | Status: DC

## 2017-09-23 MED ORDER — AEROCHAMBER PLUS FLO-VU MEDIUM MISC: 1.0000 | Freq: Once | Status: DC

## 2017-09-23 MED ORDER — FLUTICASONE PROPIONATE HFA 110 MCG/ACT IN AERO: 2.0000 | INHALATION_SPRAY | Freq: Two times a day (BID) | RESPIRATORY_TRACT | 12 refills | Status: DC

## 2017-09-23 MED ORDER — SPACER/AERO CHAMBER MOUTHPIECE MISC: 1.0000 | Freq: Once | 0 refills | Status: AC

## 2017-09-23 MED FILL — FLOVENT HFA 110 MCG INHALER: 110 | 30 days supply | Qty: 12 | Fill #0

## 2017-09-23 NOTE — Progress Notes (Signed)
Synopsis: Referred in May 2019 for possible COPD; he uses oxygen to sleep at night.  He smoked 30 pack years and quit at age 57.  Subjective:   PATIENT ID: Matthew Ortiz GENDER: male DOB: 1960/09/07, MRN: [id number]   HPI  Chief Complaint  Patient presents with  . New Consult    COPD, sleep study 5/23, wearing holister monitor    Do I have asthma or do I have COPD? > about 6 years ago he was diagnosed with a breathing problem > this happened 6 years ago > he has used albuterol periodically since then > he will have bad coughing spells that make him short of breath > he has repeated spells where he coughs until he gets short for > has been on lisinopril for four months, he doesn't think this is related > he had bronchitis 6 years ago and felt horribly and has had trouble since then > he uses the albuterol when he gets the coughing spells and it helps a lot > he uses albuterol about 4 times per week > he says this year has been worse, he thinks they have mold in their house > he says that the mold in the house has been there for years > no new pets in the house, no changes to the house environment > his predominant problem is cough and a feeling like he needs to cough out phlegm  He feels dyspnea independent of coughing spells > he will have dyspnea carrying in groceries  > that has been a problem for years  He likes to exercise but his pollen allergies really bother him and make him have worsening sinus swelling and mucus drainage in his throat.    He smoked 1.5 ppd for 20 years, quit at age 50.  He was on nighttime oxygen for 2 years, quit using it so he last used it 3-4 years.   Past Medical History:  Diagnosis Date  . Anxiety   . Asthma   . COPD (chronic obstructive pulmonary disease) ([place])   . Depression   . Diabetes mellitus, type II ([place])   . GERD (gastroesophageal reflux disease)   . HTN (hypertension)   . IBS (irritable bowel syndrome)   . Palpitations      Family History  Problem Relation Age of Onset  . Depression Mother   . Anxiety disorder Mother   . Schizophrenia Father   . Suicidality Father   . Suicidality Paternal Grandfather   . Drug abuse Paternal Grandfather   . Alcohol abuse Paternal Grandfather      Social History   Socioeconomic History  . Marital status: Married    Spouse name: Not on file  . Number of children: Not on file  . Years of education: Not on file  . Highest education level: Not on file  Occupational History  . Not on file  Social Needs  . Financial resource strain: Not on file  . Food insecurity:    Worry: Not on file    Inability: Not on file  . Transportation needs:    Medical: Not on file    Non-medical: Not on file  Tobacco Use  . Smoking status: Former Smoker    Packs/day: 1.50    Years: 20.00    Pack years: 30.00    Last attempt to quit: 05/03/1998    Years since quitting: 19.4  . Smokeless tobacco: Never Used  Substance and Sexual Activity  . Alcohol use: No  Comment: socially-a few times a year  . Drug use: No    Types: Marijuana    Comment: last times was 1 yr ago  . Sexual activity: Never  Lifestyle  . Physical activity:    Days per week: Not on file    Minutes per session: Not on file  . Stress: Not on file  Relationships  . Social connections:    Talks on phone: Not on file    Gets together: Not on file    Attends religious service: Not on file    Active member of club or organization: Not on file    Attends meetings of clubs or organizations: Not on file    Relationship status: Not on file  . Intimate partner violence:    Fear of current or ex partner: Not on file    Emotionally abused: Not on file    Physically abused: Not on file    Forced sexual activity: Not on file  Other Topics Concern  . Not on file  Social History Narrative  . Not on file     Allergies  Allergen Reactions  . Bee Pollen   . Milk-Related Compounds Diarrhea  . Penicillins Rash      Outpatient Medications Prior to Visit  Medication Sig Dispense Refill  . albuterol (PROAIR HFA) 108 (90 Base) MCG/ACT inhaler Inhale 1-2 puffs into the lungs every 6 (six) hours as needed for wheezing or shortness of breath. 1 Inhaler 3  . atorvastatin (LIPITOR) 40 MG tablet Take 1 tablet (40 mg total) by mouth daily. 90 tablet 1  . clonazePAM (KLONOPIN) 1 MG tablet Take 1 tablet (1 mg total) by mouth 3 (three) times daily as needed for anxiety. 90 tablet 0  . escitalopram (LEXAPRO) 20 MG tablet Take 1 tablet (20 mg total) by mouth daily. 90 tablet 0  . Fish Oil-Cholecalciferol (FISH OIL + D3 PO) Take by mouth.    . fluticasone (FLONASE) 50 MCG/ACT nasal spray Place 1 spray into both nostrils daily. 16 g 5  . lisinopril (PRINIVIL,ZESTRIL) 5 MG tablet Take 1 tablet (5 mg total) by mouth daily. 90 tablet 1  . lithium 600 MG capsule Take 1 capsule (600 mg total) by mouth 2 (two) times daily with a meal. 180 capsule 0  . mirtazapine (REMERON) 30 MG tablet Take 2 tablets (60 mg total) by mouth at bedtime. 180 tablet 0  . omeprazole (PRILOSEC) 40 MG capsule Take 40 mg by mouth daily.    . tamsulosin (FLOMAX) 0.4 MG CAPS capsule Take 1 capsule (0.4 mg total) by mouth daily. 180 capsule 1  . traZODone (DESYREL) 100 MG tablet Take by mouth.     No facility-administered medications prior to visit.     Review of Systems  Constitutional: Negative for chills, fever, malaise/fatigue and weight loss.  HENT: Positive for congestion and sore throat. Negative for nosebleeds and sinus pain.   Eyes: Negative for photophobia, pain and discharge.  Respiratory: Positive for cough and shortness of breath. Negative for hemoptysis, sputum production and wheezing.   Cardiovascular: Positive for palpitations. Negative for chest pain, orthopnea and leg swelling.  Gastrointestinal: Positive for heartburn. Negative for abdominal pain, constipation, diarrhea, nausea and vomiting.  Genitourinary: Negative for dysuria,  frequency, hematuria and urgency.  Musculoskeletal: Negative for back pain, joint pain, myalgias and neck pain.  Skin: Negative for itching and rash.  Neurological: Negative for tingling, tremors, sensory change, speech change, focal weakness, seizures, weakness and headaches.  Endo/Heme/Allergies: Positive for  environmental allergies.  Psychiatric/Behavioral: Positive for depression. Negative for memory loss, substance abuse and suicidal ideas. The patient is nervous/anxious.       Objective:  Physical Exam   Vitals:   09/23/17 0856 09/23/17 0858  BP:  104/80  Pulse:  89  SpO2:  98%  Weight: 177 lb (80.3 kg) 177 lb (80.3 kg)  Height: 6' (1.829 m) 6' (1.829 m)    Room Air  Walked 500 feet on RA and O2 saturation remained at or above 99%  Gen: well appearing, no acute distress HENT: NCAT, OP clear, neck supple without masses Eyes: PERRL, EOMi Lymph: no cervical lymphadenopathy PULM: Faint RUL wheeze B CV: RRR, no mgr, no JVD GI: BS+, soft, nontender, no hsm Derm: no rash or skin breakdown MSK: normal bulk and tone Neuro: A&Ox4, CN II-XII intact, strength 5/5 in all 4 extremities Psyche: normal mood and affect   CBC    Component Value Date/Time   WBC 4.8 07/12/2017 1045   RBC 5.02 07/12/2017 1045   HGB 14.8 07/12/2017 1045   HCT 43.5 07/12/2017 1045   PLT 326 07/12/2017 1045   MCV 87 07/12/2017 1045   MCH 29.5 07/12/2017 1045   MCHC 34.0 07/12/2017 1045   RDW 13.8 07/12/2017 1045   LYMPHSABS 1.6 07/12/2017 1045   EOSABS 0.1 07/12/2017 1045   BASOSABS 0.0 07/12/2017 1045     Chest imaging:  PFT: 08/2017 Ratio 76%, FEV1 3.1L (79% pred), FVC 4.1L (79% pred)  Exhaled nitric oxide: May 2019 exhaled nitric oxide 29 ppm  Labs:  Path:  Echo:  Heart Catheterization:  Records from his visit with Neurology reviewed where he was noted to have nocturnal hypoxemia and OSA.  He was referred to [place] for evaluation of assessment of chronic lung disease and possible  COPD.     Assessment & Plan:   SOB (shortness of breath) - Plan: Spirometry with Graph, Nitric oxide, DG Chest 2 View, AEROCHAMBER PLUS FLO-VU SMALL device MISC 1 each  Mild persistent asthma, unspecified whether complicated - Plan: AEROCHAMBER PLUS FLO-VU SMALL device MISC 1 each  Cough  Discussion: Mr. Rayburn comes to clinic today for evaluation of cough and some dyspnea which has been progressive over the last several years after a lengthy smoking history.  Objectively there is a wheeze in the right upper lobe, minimally elevated exhaled nitric oxide and no fixed airflow obstruction.  He may have asthma, though given the low forced vital capacity seen on spirometry testing I think he needs to have full pulmonary function testing to assess for air trapping versus less likely restrictive lung disease.  He also needs to have a chest x-ray to evaluate further.  I question whether or not the lisinopril could be making his symptoms worse but it sounds as if his symptoms started prior to the onset of lisinopril exposure.  I think for now it is best to treat him as if he has mild persistent asthma which is poorly controlled considering the frequency of his albuterol use.  Plan: Mild persistent asthma: Start using Flovent 1 puff twice a day Continue albuterol as needed for chest tightness wheezing or shortness of breath  Shortness of breath and cough: We will check a chest x-ray and a lung function test to see if there is evidence of other lung abnormalities.  Hypertension: Continue lisinopril for now, however if we are unable to identify a significant lung abnormality and you still have problems with cough we may need to change you  off of this medicine.  Follow-up in 2 to 3 weeks with either me or nurse practitioner to go over the results of the lung function test and see how you are doing with Flovent.    Current Outpatient Medications:  .  albuterol (PROAIR HFA) 108 (90 Base) MCG/ACT  inhaler, Inhale 1-2 puffs into the lungs every 6 (six) hours as needed for wheezing or shortness of breath., Disp: 1 Inhaler, Rfl: 3 .  atorvastatin (LIPITOR) 40 MG tablet, Take 1 tablet (40 mg total) by mouth daily., Disp: 90 tablet, Rfl: 1 .  clonazePAM (KLONOPIN) 1 MG tablet, Take 1 tablet (1 mg total) by mouth 3 (three) times daily as needed for anxiety., Disp: 90 tablet, Rfl: 0 .  escitalopram (LEXAPRO) 20 MG tablet, Take 1 tablet (20 mg total) by mouth daily., Disp: 90 tablet, Rfl: 0 .  Fish Oil-Cholecalciferol (FISH OIL + D3 PO), Take by mouth., Disp: , Rfl:  .  fluticasone (FLONASE) 50 MCG/ACT nasal spray, Place 1 spray into both nostrils daily., Disp: 16 g, Rfl: 5 .  lisinopril (PRINIVIL,ZESTRIL) 5 MG tablet, Take 1 tablet (5 mg total) by mouth daily., Disp: 90 tablet, Rfl: 1 .  lithium 600 MG capsule, Take 1 capsule (600 mg total) by mouth 2 (two) times daily with a meal., Disp: 180 capsule, Rfl: 0 .  mirtazapine (REMERON) 30 MG tablet, Take 2 tablets (60 mg total) by mouth at bedtime., Disp: 180 tablet, Rfl: 0 .  omeprazole (PRILOSEC) 40 MG capsule, Take 40 mg by mouth daily., Disp: , Rfl:  .  tamsulosin (FLOMAX) 0.4 MG CAPS capsule, Take 1 capsule (0.4 mg total) by mouth daily., Disp: 180 capsule, Rfl: 1 .  traZODone (DESYREL) 100 MG tablet, Take by mouth., Disp: , Rfl:  .  fluticasone (FLOVENT HFA) 110 MCG/ACT inhaler, Inhale 2 puffs into the lungs 2 (two) times daily., Disp: 1 Inhaler, Rfl: 12  Current Facility-Administered Medications:  .  AEROCHAMBER PLUS FLO-VU SMALL device MISC 1 each, 1 each, Other, Once, Juanito Doom, MD

## 2017-09-23 NOTE — Addendum Note (Signed)
Addended by: Dolores Lory on: 09/23/2017 10:09 AM   Modules accepted: Orders

## 2017-09-23 NOTE — Progress Notes (Signed)
Dear Dr. Fayne Mediate,   Please note that Neurology did not establish a diagnosis of COPD or Hypoxemia- He was seen here with these diagnoses quoted in his chart as part of his medical history- from years ago, and had been prescribed oxygen by another sleep provider outside.  His sleep study from 09-22-2017 did neither document apnea nor hypoxemia.  I will send you a copy of the results.  Sincerely,   Marijayne Rauth, MD   Diplomate of the ABPN, [place] and Fellow of the AASM.   [facility]

## 2017-09-23 NOTE — Patient Instructions (Signed)
Mild persistent asthma: Start using Flovent 1 puff twice a day Continue albuterol as needed for chest tightness wheezing or shortness of breath  Shortness of breath and cough: We will check a chest x-ray and a lung function test to see if there is evidence of other lung abnormalities.  Hypertension: Continue lisinopril for now, however if we are unable to identify a significant lung abnormality and you still have problems with cough we may need to change you off of this medicine.  Follow-up in 2 to 3 weeks with either me or nurse practitioner to go over the results of the lung function test and see how you are doing with Flovent.

## 2017-09-23 NOTE — Addendum Note (Signed)
Addended by: Mathis Bud on: 09/23/2017 10:06 AM   Modules accepted: Orders

## 2017-09-23 NOTE — Procedures (Signed)
PATIENT'S NAME:  Matthew Ortiz, Matthew Ortiz DOB:      07-20-1960      MR#:    [id number]     DATE OF RECORDING: 09/22/2017 REFERRING M.D.:  Laurey Arrow. Brigitte Pulse, M.D. Study Performed:   Baseline Polysomnogram HISTORY:  Mr. Flenner is a new patient to my sleep clinic, and reports that he was evaluated for sleep apnea several years ago but the study results were inconclusive.  He may have had mild apnea but it was deemed not clinically significant enough to initiate CPAP therapy.  He had however the diagnosis of nocturnal hypoxemia and was placed on oxygen.  He used oxygen by 2 L/min nasal cannula overnight but the majority of nights he found the cannula next to him in bed and not in its proper place. In 2017 he decided to discontinue the oxygen treatment.  In the meantime his wife has told him that he snores, he wakes up frequently with a very dry mouth and parched tongue, and he reports some daytime microsleep attacks, palpitations ( is on a monitor for 30 days right now), and daytime fatigue , EDS excessive daytime sleepiness, the need for naps.  The patient endorsed the Epworth Sleepiness Scale at 10 points with taking naps. FSS at 36 points.   The patient's weight 175 pounds with a height of 72 (inches), resulting in a BMI of 23.6 kg/m2. The patient's neck circumference measured 15.2 inches.  CURRENT MEDICATIONS: ProAir HFA 90, Lipitor, Klonopin, Lexapro, fish oil, Flonase, Lisinopril,  Lithium, Remeron, Prilosec, Flomax, Desyrel   PROCEDURE:  This is a multichannel digital polysomnogram utilizing the Somnostar 11.2 system.  Electrodes and sensors were applied and monitored per AASM Specifications.   EEG, EOG, Chin and Limb EMG, were sampled at 200 Hz.  ECG, Snore and Nasal Pressure, Thermal Airflow, Respiratory Effort, CPAP Flow and Pressure, Oximetry was sampled at 50 Hz. Digital video and audio were recorded.      BASELINE STUDY: Lights Out was at 22:15 and Lights On at 04:59.  Total recording time (TRT) was 405  minutes, with a total sleep time (TST) of 384 minutes. The patient's sleep latency was 1.5 minutes.  REM latency was 88.5 minutes. The sleep efficiency was 94.8 %.     SLEEP ARCHITECTURE: WASO (Wake after sleep onset) was 19 minutes.  There were 7 minutes in Stage N1, 357.5 minutes Stage N2, 0 minutes Stage N3 and 19.5 minutes in Stage REM.  The percentage of Stage N1 was 1.8%, Stage N2 was 93.1%, Stage N3 was 0% and Stage R (REM sleep) was 5.1%.   RESPIRATORY ANALYSIS:  There were a total of 19 respiratory events:  1 obstructive apnea, 1 central apnea and 17 hypopneas with 0 respiratory event related arousals (RERAs). Mild snoring was noted.    The total APNEA/HYPOPNEA INDEX (AHI) was 3.0/hour and the total RESPIRATORY DISTURBANCE INDEX was 0.3 /hour.  1 event occurred in REM sleep and 34 events in NREM. The REM AHI was 3.1 /hour, versus a non-REM AHI of 3.0.  The patient spent 266 minutes of total sleep time in the supine position and 118 minutes in non-supine. The supine AHI was 2.1 versus a non-supine AHI of 5.1.  OXYGEN SATURATION & C02:  The Wake baseline 02 saturation was 96%, with the lowest being 87%. Time spent below 89% saturation equaled 3 minutes.    PERIODIC LIMB MOVEMENTS:  The patient had a total of 0 Periodic Limb Movements.   The arousals were noted as: 49 were  spontaneous, 0 were associated with PLMs, and 2 were associated with respiratory events. Audio and video analysis did not show any abnormal or unusual movements, behaviors, phonations or vocalizations.   The patient took one bathroom break. EKG was in keeping with normal sinus rhythm (NSR). Post-study, the patient indicated that sleep was better than usual.    IMPRESSION:  1. No clinically significant Obstructive Sleep Apnea (OSA), neither hypoxemia, PLM disorder, nor cardiac arrhythmia.  2. Primary Snoring was mild- moderate. 3. High sleep efficiency and prompt initiation of sleep.   RECOMMENDATIONS:  1. Good Sleep  ! Implementing sleep hygiene may have been the only treatment needed. 2. Snoring is not waking him up but may disturb his spouse and we could consider treatment by a dental device.    I certify that I have reviewed the entire raw data recording prior to the issuance of this report in accordance with the Standards of Accreditation of the American Academy of Sleep Medicine (AASM)   Larey Seat, MD    09-23-2017  Diplomat, American Board of Psychiatry and Neurology  Diplomat, American Board of [place] Director, [place] [facility] [employer]

## 2017-09-26 DIAGNOSIS — F4310 Post-traumatic stress disorder, unspecified: Secondary | ICD-10-CM | POA: Diagnosis not present

## 2017-09-27 ENCOUNTER — Telehealth: Payer: Self-pay | Admitting: Neurology

## 2017-09-27 NOTE — Telephone Encounter (Signed)
Pt called and I was able to review his sleep study results. The patient was appreciative for the call. At this time he didn't see the need for the dental device but was happy to know that there was no apnea or any other medical issues. Instructed the patient to call if he had any other concerns.

## 2017-09-27 NOTE — Telephone Encounter (Signed)
-----   Message from Larey Seat, MD sent at 09/23/2017 12:21 PM EDT ----- He has neither apnea nor hypoxemia and slept very well in the lab- sleep routines and improving sleep hygiene was successful for him. Snoring can be treated -if the patient feels it's necessary.  He would not need to follow up with me, I can send a dental device referral over if he wants me to. Thank You.

## 2017-09-27 NOTE — Telephone Encounter (Signed)
Pt returning RN's call.

## 2017-09-27 NOTE — Telephone Encounter (Signed)
Called patient to discuss sleep study results. No answer at this time. LVM for the patient to call back.   

## 2017-09-29 DIAGNOSIS — F4310 Post-traumatic stress disorder, unspecified: Secondary | ICD-10-CM | POA: Diagnosis not present

## 2017-09-30 NOTE — Progress Notes (Signed)
Dera Dr. Lake Bells,  Thank you for the detailed report, not having COPD is a good result. Thanks again for the assessment and plan as directed. I have to review what let to the low 02 sats, error or artefact ?     Kawan [place], MD

## 2017-10-03 DIAGNOSIS — F4310 Post-traumatic stress disorder, unspecified: Secondary | ICD-10-CM | POA: Diagnosis not present

## 2017-10-06 ENCOUNTER — Encounter (HOSPITAL_COMMUNITY): Payer: Self-pay | Admitting: Psychiatry

## 2017-10-06 ENCOUNTER — Ambulatory Visit (HOSPITAL_COMMUNITY): Payer: Medicare PPO | Admitting: Psychiatry

## 2017-10-06 VITALS — BP 110/66 | HR 82 | Ht 72.0 in | Wt 177.0 lb

## 2017-10-06 DIAGNOSIS — F3181 Bipolar II disorder: Secondary | ICD-10-CM | POA: Diagnosis not present

## 2017-10-06 DIAGNOSIS — F5105 Insomnia due to other mental disorder: Secondary | ICD-10-CM

## 2017-10-06 DIAGNOSIS — Z818 Family history of other mental and behavioral disorders: Secondary | ICD-10-CM

## 2017-10-06 DIAGNOSIS — Z87891 Personal history of nicotine dependence: Secondary | ICD-10-CM

## 2017-10-06 DIAGNOSIS — Z813 Family history of other psychoactive substance abuse and dependence: Secondary | ICD-10-CM

## 2017-10-06 DIAGNOSIS — F4310 Post-traumatic stress disorder, unspecified: Secondary | ICD-10-CM | POA: Diagnosis not present

## 2017-10-06 DIAGNOSIS — F411 Generalized anxiety disorder: Secondary | ICD-10-CM | POA: Diagnosis not present

## 2017-10-06 DIAGNOSIS — Z811 Family history of alcohol abuse and dependence: Secondary | ICD-10-CM

## 2017-10-06 MED ORDER — MIRTAZAPINE 30 MG PO TABS: 60.0000 mg | ORAL_TABLET | Freq: Every day | ORAL | 0 refills | Status: DC

## 2017-10-06 MED ORDER — TRAZODONE HCL 100 MG PO TABS: 100.0000 mg | ORAL_TABLET | Freq: Every day | ORAL | 0 refills | Status: DC

## 2017-10-06 MED ORDER — LITHIUM CARBONATE 600 MG PO CAPS: 600.0000 mg | ORAL_CAPSULE | Freq: Two times a day (BID) | ORAL | 0 refills | Status: DC

## 2017-10-06 MED ORDER — CLONAZEPAM 1 MG PO TABS: 1.0000 mg | ORAL_TABLET | Freq: Three times a day (TID) | ORAL | 2 refills | Status: DC | PRN

## 2017-10-06 MED ORDER — ESCITALOPRAM OXALATE 10 MG PO TABS: 30.0000 mg | ORAL_TABLET | Freq: Every day | ORAL | 0 refills | Status: DC

## 2017-10-06 MED FILL — traZODone HCL 100 MG TABS: 100 | 90 days supply | Qty: 90 | Fill #0

## 2017-10-06 MED FILL — clonazePAM 1 MG TABS: 1 | 30 days supply | Qty: 90 | Fill #0

## 2017-10-06 MED FILL — MIRTAZAPINE 30 MG TABLET: 30 | 90 days supply | Qty: 180 | Fill #0

## 2017-10-06 MED FILL — LITHIUM CARBONATE 600 MG CA: 600 | 90 days supply | Qty: 180 | Fill #0
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
## 2017-10-10 DIAGNOSIS — F4310 Post-traumatic stress disorder, unspecified: Secondary | ICD-10-CM | POA: Diagnosis not present

## 2017-10-11 ENCOUNTER — Encounter: Payer: Self-pay | Admitting: Adult Health

## 2017-10-11 ENCOUNTER — Ambulatory Visit: Payer: Medicare PPO | Admitting: Adult Health

## 2017-10-11 DIAGNOSIS — J310 Chronic rhinitis: Secondary | ICD-10-CM | POA: Insufficient documentation

## 2017-10-11 DIAGNOSIS — J45909 Unspecified asthma, uncomplicated: Secondary | ICD-10-CM | POA: Insufficient documentation

## 2017-10-11 DIAGNOSIS — J4530 Mild persistent asthma, uncomplicated: Secondary | ICD-10-CM

## 2017-10-11 MED ORDER — FLUTICASONE PROPIONATE HFA 110 MCG/ACT IN AERO: 2.0000 | INHALATION_SPRAY | Freq: Two times a day (BID) | RESPIRATORY_TRACT | 5 refills | Status: DC

## 2017-10-11 NOTE — Assessment & Plan Note (Signed)
Asthma with significant improvement on Flovent.  Exhaled nitric oxide testing was mildly elevated consistent with allergic process.  Spirometry showed no airflow obstruction.  Chest x-ray was clear. Continue with ICS, oral care discussed  Plan  Patient Instructions  Continue on Flovent 1 puff Twice daily  , rinse after use.  Continue on Flonase nasal daily  May use Allegra 180mg  daily As needed  Drainage  Follow up with Dr. Lake Bells in 4-6 months and As needed   Please contact office for sooner follow up if symptoms do not improve or worsen or seek emergency care

## 2017-10-11 NOTE — Assessment & Plan Note (Signed)
Continue  on Flonase. Add Allegra daily as needed

## 2017-10-11 NOTE — Progress Notes (Signed)
@Patient  ID: Matthew Ortiz, male    DOB: May 14, 1960, 57 y.o.   MRN: [id number]  Chief Complaint  Patient presents with  . Follow-up    Asthma     Referring provider: Shawnee Knapp, MD  HPI: 57 year old male former smoker seen for pulmonary consult Sep 23, 2017 for dyspnea.  Work-up showed that patient may have underlying asthma  TEST /Events  08/2017 Walked 500 feet on RA and O2 saturation remained at or above 99% May 2019 spirometry showed FEV1 at 79%, ratio 76, FVC 79% 08/2017 CXR lungs clear  May 2019 exhaled nitric oxide 29 ppm  10/11/2017 Follow up : Asthma  Patient presents for a 2-week follow-up.  Patient was seen last visit for pulmonary consult.  He was having ongoing shortness of breath and cough.  He did have a smoking history.  Was told in the past he may have COPD.  Work-up did not show any significant airflow obstruction.  Spirometry showed mild restriction with an FEV1 at 79%, ratio 76, FVC 79%.  Walk test in the office on room air did not show any desaturations.  Exhaled nitric oxide testing was mildly elevated at 29ppb . Patient was started on Flovent.  Since last visit patient says he is substantially improved.  His cough has resolved.  He has had no Ventolin use since last visit.  Activity tolerance is improved. Patient is on lisinopril and has been advised that if cough returns or persist this may need to be changed. He denies any chest pain orthopnea PND or leg swelling.  Does have some chronic rhinitis.  Uses Flonase daily.  Says over the last few weeks it has had increased nasal drainage.   Allergies  Allergen Reactions  . Bee Pollen   . Milk-Related Compounds Diarrhea  . Penicillins Rash    Immunization History  Administered Date(s) Administered  . Influenza Split 01/31/2017  . Pneumococcal Polysaccharide-23 07/12/2017  . Zoster Recombinat (Shingrix) 03/03/2017, 06/03/2017    Past Medical History:  Diagnosis Date  . Anxiety   . Asthma   . COPD  (chronic obstructive pulmonary disease) ([place])   . Depression   . Diabetes mellitus, type II ([place])   . GERD (gastroesophageal reflux disease)   . HTN (hypertension)   . IBS (irritable bowel syndrome)   . Palpitations     Tobacco History: Social History   Tobacco Use  Smoking Status Former Smoker  . Packs/day: 1.50  . Years: 20.00  . Pack years: 30.00  . Last attempt to quit: 05/03/1998  . Years since quitting: 19.4  Smokeless Tobacco Never Used   Counseling given: Not Answered   Outpatient Encounter Medications as of 10/11/2017  Medication Sig  . albuterol (PROAIR HFA) 108 (90 Base) MCG/ACT inhaler Inhale 1-2 puffs into the lungs every 6 (six) hours as needed for wheezing or shortness of breath.  Marland Kitchen atorvastatin (LIPITOR) 40 MG tablet Take 1 tablet (40 mg total) by mouth daily.  . clonazePAM (KLONOPIN) 1 MG tablet Take 1 tablet (1 mg total) by mouth 3 (three) times daily as needed for anxiety.  Marland Kitchen escitalopram (LEXAPRO) 10 MG tablet Take 3 tablets (30 mg total) by mouth daily.  . Fish Oil-Cholecalciferol (FISH OIL + D3 PO) Take by mouth.  . fluticasone (FLONASE) 50 MCG/ACT nasal spray Place 1 spray into both nostrils daily.  . fluticasone (FLOVENT HFA) 110 MCG/ACT inhaler Inhale 2 puffs into the lungs 2 (two) times daily.  Marland Kitchen lisinopril (PRINIVIL,ZESTRIL) 5 MG tablet Take 1  tablet (5 mg total) by mouth daily.  Marland Kitchen lithium 600 MG capsule Take 1 capsule (600 mg total) by mouth 2 (two) times daily with a meal.  . mirtazapine (REMERON) 30 MG tablet Take 2 tablets (60 mg total) by mouth at bedtime.  Marland Kitchen omeprazole (PRILOSEC) 40 MG capsule Take 40 mg by mouth daily.  . tamsulosin (FLOMAX) 0.4 MG CAPS capsule Take 1 capsule (0.4 mg total) by mouth daily.  . traZODone (DESYREL) 100 MG tablet Take 1 tablet (100 mg total) by mouth at bedtime.   No facility-administered encounter medications on file as of 10/11/2017.      Review of Systems  Constitutional:   No  weight loss, night sweats,   Fevers, chills, fatigue, or  lassitude.  HEENT:   No headaches,  Difficulty swallowing,  Tooth/dental problems, or  Sore throat,                No sneezing, itching, ear ache,  +nasal congestion, post nasal drip,   CV:  No chest pain,  Orthopnea, PND, swelling in lower extremities, anasarca, dizziness, palpitations, syncope.   GI  No heartburn, indigestion, abdominal pain, nausea, vomiting, diarrhea, change in bowel habits, loss of appetite, bloody stools.   Resp: No shortness of breath with exertion or at rest.  No excess mucus, no productive cough,  No non-productive cough,  No coughing up of blood.  No change in color of mucus.  No wheezing.  No chest wall deformity  Skin: no rash or lesions.  GU: no dysuria, change in color of urine, no urgency or frequency.  No flank pain, no hematuria   MS:  No joint pain or swelling.  No decreased range of motion.  No back pain.    Physical Exam  BP 108/64 (BP Location: Left Arm, Cuff Size: Normal)   Pulse 78   Ht 6' (1.829 m)   Wt 176 lb 3.2 oz (79.9 kg)   SpO2 97%   BMI 23.90 kg/m    GEN: A/Ox3; pleasant , NAD, well nourished    HEENT:  [place]/AT,  EACs-clear, TMs-wnl, NOSE-clear, THROAT-clear, no lesions, no postnasal drip or exudate noted.   NECK:  Supple w/ fair ROM; no JVD; normal carotid impulses w/o bruits; no thyromegaly or nodules palpated; no lymphadenopathy.    RESP  Clear  P & A; w/o, wheezes/ rales/ or rhonchi. no accessory muscle use, no dullness to percussion  CARD:  RRR, no m/r/g, no peripheral edema, pulses intact, no cyanosis or clubbing.  GI:   Soft & nt; nml bowel sounds; no organomegaly or masses detected.   Musco: Warm bil, no deformities or joint swelling noted.   Neuro: alert, no focal deficits noted.    Skin: Warm, no lesions or rashes    Lab Results:  CBC    Component Value Date/Time   WBC 4.8 07/12/2017 1045   RBC 5.02 07/12/2017 1045   HGB 14.8 07/12/2017 1045   HCT 43.5 07/12/2017 1045   PLT  326 07/12/2017 1045   MCV 87 07/12/2017 1045   MCH 29.5 07/12/2017 1045   MCHC 34.0 07/12/2017 1045   RDW 13.8 07/12/2017 1045   LYMPHSABS 1.6 07/12/2017 1045   EOSABS 0.1 07/12/2017 1045   BASOSABS 0.0 07/12/2017 1045    BMET    Component Value Date/Time   NA 141 08/11/2017 1544   K 4.2 08/11/2017 1544   CL 105 08/11/2017 1544   CO2 22 08/11/2017 1544   GLUCOSE 107 (H) 08/11/2017 1544  GLUCOSE 109 (H) 01/12/2011 1041   BUN 14 08/11/2017 1544   CREATININE 0.87 08/11/2017 1544   CALCIUM 9.5 08/11/2017 1544   GFRNONAA 95 08/11/2017 1544   GFRAA 110 08/11/2017 1544    BNP No results found for: BNP  ProBNP No results found for: PROBNP  Imaging: Dg Chest 2 View  Result Date: 09/23/2017 CLINICAL DATA:  Dry cough EXAM: CHEST - 2 VIEW COMPARISON:  None. FINDINGS: The heart size and mediastinal contours are within normal limits. Both lungs are clear. The visualized skeletal structures are unremarkable. IMPRESSION: No active cardiopulmonary disease. Electronically Signed   By: Inez Catalina M.D.   On: 09/23/2017 10:25     Assessment & Plan:   Asthma Asthma with significant improvement on Flovent.  Exhaled nitric oxide testing was mildly elevated consistent with allergic process.  Spirometry showed no airflow obstruction.  Chest x-ray was clear. Continue with ICS, oral care discussed  Plan  Patient Instructions  Continue on Flovent 1 puff Twice daily  , rinse after use.  Continue on Flonase nasal daily  May use Allegra 180mg  daily As needed  Drainage  Follow up with Dr. Lake Bells in 4-6 months and As needed   Please contact office for sooner follow up if symptoms do not improve or worsen or seek emergency care       Chronic rhinitis Continue  on Flonase. Add Allegra daily as needed     [employer], NP 10/11/2017

## 2017-10-11 NOTE — Progress Notes (Signed)
Reviewed, agree 

## 2017-10-11 NOTE — Patient Instructions (Signed)
Continue on Flovent 1 puff Twice daily  , rinse after use.  Continue on Flonase nasal daily  May use Allegra 180mg  daily As needed  Drainage  Follow up with Dr. Lake Bells in 4-6 months and As needed   Please contact office for sooner follow up if symptoms do not improve or worsen or seek emergency care

## 2017-10-11 NOTE — Addendum Note (Signed)
Addended by: Parke Poisson E on: 10/11/2017 12:30 PM   Modules accepted: Orders

## 2017-10-12 ENCOUNTER — Other Ambulatory Visit (HOSPITAL_COMMUNITY): Payer: Self-pay

## 2017-10-13 DIAGNOSIS — F4310 Post-traumatic stress disorder, unspecified: Secondary | ICD-10-CM | POA: Diagnosis not present

## 2017-10-17 DIAGNOSIS — F4310 Post-traumatic stress disorder, unspecified: Secondary | ICD-10-CM | POA: Diagnosis not present

## 2017-10-20 DIAGNOSIS — F4310 Post-traumatic stress disorder, unspecified: Secondary | ICD-10-CM | POA: Diagnosis not present

## 2017-10-24 DIAGNOSIS — F4310 Post-traumatic stress disorder, unspecified: Secondary | ICD-10-CM | POA: Diagnosis not present

## 2017-10-27 DIAGNOSIS — F4310 Post-traumatic stress disorder, unspecified: Secondary | ICD-10-CM | POA: Diagnosis not present

## 2017-10-31 ENCOUNTER — Ambulatory Visit (INDEPENDENT_AMBULATORY_CARE_PROVIDER_SITE_OTHER): Payer: Medicare PPO | Admitting: Family Medicine

## 2017-10-31 VITALS — BP 110/76 | HR 81 | Temp 98.9°F | Resp 16 | Ht 72.0 in | Wt 174.0 lb

## 2017-10-31 DIAGNOSIS — N138 Other obstructive and reflux uropathy: Secondary | ICD-10-CM

## 2017-10-31 DIAGNOSIS — N401 Enlarged prostate with lower urinary tract symptoms: Secondary | ICD-10-CM | POA: Diagnosis not present

## 2017-10-31 DIAGNOSIS — F4310 Post-traumatic stress disorder, unspecified: Secondary | ICD-10-CM | POA: Diagnosis not present

## 2017-10-31 DIAGNOSIS — I10 Essential (primary) hypertension: Secondary | ICD-10-CM | POA: Diagnosis not present

## 2017-10-31 DIAGNOSIS — M67471 Ganglion, right ankle and foot: Secondary | ICD-10-CM | POA: Diagnosis not present

## 2017-10-31 DIAGNOSIS — E118 Type 2 diabetes mellitus with unspecified complications: Secondary | ICD-10-CM

## 2017-10-31 DIAGNOSIS — E785 Hyperlipidemia, unspecified: Secondary | ICD-10-CM | POA: Diagnosis not present

## 2017-10-31 LAB — POCT GLYCOSYLATED HEMOGLOBIN (HGB A1C): Hemoglobin A1C: 5.5 % (ref 4.0–5.6)

## 2017-10-31 MED ORDER — MELOXICAM 15 MG PO TABS: 15.0000 mg | ORAL_TABLET | Freq: Every day | ORAL | 1 refills | Status: DC

## 2017-10-31 MED ORDER — ATORVASTATIN CALCIUM 40 MG PO TABS: 40.0000 mg | ORAL_TABLET | Freq: Every day | ORAL | 1 refills | Status: DC

## 2017-10-31 MED FILL — ATORVASTATIN 40 MG TABLET: 40 | 90 days supply | Qty: 90 | Fill #0

## 2017-10-31 MED FILL — MELOXICAM 15 MG TABLET: 15 | 30 days supply | Qty: 30 | Fill #0

## 2017-10-31 NOTE — Progress Notes (Signed)
Subjective:    Patient ID: Matthew Ortiz, male    DOB: [birth year], 57 y.o.   MRN: [id number] Chief Complaint  Patient presents with  . Diabetes    bloodwork  . Medication Problem    pt would like to stop taking lisinopril b/c of astma/ refill of atovastatin    HPI  Would start coughing and cough would turn into a gag which would choke.  Past Medical History:  Diagnosis Date  . Anxiety   . Asthma   . COPD (chronic obstructive pulmonary disease) ([place])   . Depression   . Diabetes mellitus, type II ([place])   . GERD (gastroesophageal reflux disease)   . HTN (hypertension)   . IBS (irritable bowel syndrome)   . Palpitations    Past Surgical History:  Procedure Laterality Date  . APPENDECTOMY    . FUNDOPLASTY TRANSTHORACIC    . HERNIA REPAIR    . RECONSTRUCTION OF NOSE     Current Outpatient Medications on File Prior to Visit  Medication Sig Dispense Refill  . albuterol (PROAIR HFA) 108 (90 Base) MCG/ACT inhaler Inhale 1-2 puffs into the lungs every 6 (six) hours as needed for wheezing or shortness of breath. 1 Inhaler 3  . clonazePAM (KLONOPIN) 1 MG tablet Take 1 tablet (1 mg total) by mouth 3 (three) times daily as needed for anxiety. 90 tablet 2  . escitalopram (LEXAPRO) 10 MG tablet Take 3 tablets (30 mg total) by mouth daily. 270 tablet 0  . Fish Oil-Cholecalciferol (FISH OIL + D3 PO) Take by mouth.    . fluticasone (FLONASE) 50 MCG/ACT nasal spray Place 1 spray into both nostrils daily. 16 g 5  . fluticasone (FLOVENT HFA) 110 MCG/ACT inhaler Inhale 2 puffs into the lungs 2 (two) times daily. 1 Inhaler 5  . lithium 600 MG capsule Take 1 capsule (600 mg total) by mouth 2 (two) times daily with a meal. 180 capsule 0  . mirtazapine (REMERON) 30 MG tablet Take 2 tablets (60 mg total) by mouth at bedtime. 180 tablet 0  . omeprazole (PRILOSEC) 40 MG capsule Take 40 mg by mouth daily.    . tamsulosin (FLOMAX) 0.4 MG CAPS capsule Take 1 capsule (0.4 mg total) by mouth daily. 180  capsule 1  . traZODone (DESYREL) 100 MG tablet Take 1 tablet (100 mg total) by mouth at bedtime. 90 tablet 0   No current facility-administered medications on file prior to visit.    Allergies  Allergen Reactions  . Bee Pollen   . Milk-Related Compounds Diarrhea  . Penicillins Rash   Family History  Problem Relation Age of Onset  . Depression Mother   . Anxiety disorder Mother   . Schizophrenia Father   . Suicidality Father   . Suicidality Paternal Grandfather   . Drug abuse Paternal Grandfather   . Alcohol abuse Paternal Grandfather    Social History   Socioeconomic History  . Marital status: Married    Spouse name: Not on file  . Number of children: Not on file  . Years of education: Not on file  . Highest education level: Not on file  Occupational History  . Not on file  Social Needs  . Financial resource strain: Not on file  . Food insecurity:    Worry: Not on file    Inability: Not on file  . Transportation needs:    Medical: Not on file    Non-medical: Not on file  Tobacco Use  . Smoking status: Former Smoker  Packs/day: 1.50    Years: 20.00    Pack years: 30.00    Last attempt to quit: 05/03/1998    Years since quitting: 19.5  . Smokeless tobacco: Never Used  Substance and Sexual Activity  . Alcohol use: No    Comment: socially-small amount of wine monthly - 1 glass   . Drug use: No    Types: Marijuana    Comment: last times was 1 yr ago  . Sexual activity: Never  Lifestyle  . Physical activity:    Days per week: Not on file    Minutes per session: Not on file  . Stress: Not on file  Relationships  . Social connections:    Talks on phone: Not on file    Gets together: Not on file    Attends religious service: Not on file    Active member of club or organization: Not on file    Attends meetings of clubs or organizations: Not on file    Relationship status: Not on file  Other Topics Concern  . Not on file  Social History Narrative  . Not on  file   Depression screen [facility] 2/9 10/31/2017 07/12/2017  Decreased Interest 0 2  Down, Depressed, Hopeless 0 2  PHQ - 2 Score 0 4  Altered sleeping - 2  Tired, decreased energy - 3  Change in appetite - 2  Feeling bad or failure about yourself  - 2  Trouble concentrating - 2  Moving slowly or fidgety/restless - 1  Suicidal thoughts - 1  PHQ-9 Score - 17  Difficult doing work/chores - Very difficult       Review of Systems See hpi    Objective:   Physical Exam  Constitutional: He is oriented to person, place, and time. He appears well-developed and well-nourished. No distress.  HENT:  Head: Normocephalic and atraumatic.  Eyes: Pupils are equal, round, and reactive to light. Conjunctivae are normal. No scleral icterus.  Neck: Normal range of motion. Neck supple. No thyromegaly present.  Cardiovascular: Normal rate, regular rhythm, normal heart sounds and intact distal pulses.  Pulmonary/Chest: Effort normal and breath sounds normal. No respiratory distress.  Musculoskeletal: He exhibits no edema.  Lymphadenopathy:    He has no cervical adenopathy.  Neurological: He is alert and oriented to person, place, and time.  Skin: Skin is warm and dry. He is not diaphoretic.  Psychiatric: He has a normal mood and affect. His behavior is normal.      BP 110/76   Pulse 81   Temp 98.9 F (37.2 C) (Oral)   Resp 16   Ht 6' (1.829 m)   Wt 174 lb (78.9 kg)   SpO2 96%   BMI 23.60 kg/m      Assessment & Plan:   1. Essential hypertension   2. Type 2 diabetes mellitus with complication, without long-term current use of insulin ([place])   3. Hyperlipidemia LDL goal <100   4. BPH with obstruction/lower urinary tract symptoms   5. Ganglion cyst of right foot     Orders Placed This Encounter  Procedures  . Comprehensive metabolic panel    Order Specific Question:   Has the patient fasted?    Answer:   Yes  . Lipid panel    Standing Status:   Future    Number of Occurrences:   1     Standing Expiration Date:   11/01/2018    Order Specific Question:   Has the patient fasted?  Answer:   Yes  . PSA  . Ambulatory referral to Podiatry    Referral Priority:   Routine    Referral Type:   Consultation    Referral Reason:   Specialty Services Required    Requested Specialty:   Podiatry    Number of Visits Requested:   1  . Ambulatory referral to Urology    Referral Priority:   Routine    Referral Type:   Consultation    Referral Reason:   Specialty Services Required    Requested Specialty:   Urology    Number of Visits Requested:   1  . POCT glycosylated hemoglobin (Hb A1C)    Meds ordered this encounter  Medications  . atorvastatin (LIPITOR) 40 MG tablet    Sig: Take 1 tablet (40 mg total) by mouth daily.    Dispense:  90 tablet    Refill:  1  . meloxicam (MOBIC) 15 MG tablet    Sig: Take 1 tablet (15 mg total) by mouth daily.    Dispense:  30 tablet    Refill:  1  incontinence with mod large prostate - myrbetriq was not any more effective for him at all and was no better at all.  Des get worse with more coffee and soda.  Tries to taper fluids off about 8 or 8:30 as will have more noctime enurese, does void right before bed.  Does the second void. Had sleep study done Has not seen urology 8 months.  Has wet and not know it -   2 shoulder surgeries prior but at times it aches daily like a tootchace - last surgery was 10 years but for the last year it has started botherginer him again.  Doing heat > ice, accupunture, no swelling, anterior aspect, radiating down front of arm to elbow.  Will start during at rest and esp when holding cell phone up, ortho seen at [facility].   Does not have copd - does have asthma - father passed from emphyseam - lungs clear per Dr. Lake Bells  USES [employer] FOR DIABETIC SUPPLY REFILL.  Delman Cheadle, M.D.  Primary Care at [facility] [address] [place],  [address] [phone] phone [phone] fax  10/31/17  1:28 PM

## 2017-10-31 NOTE — Patient Instructions (Addendum)
Make sure you are drinking plenty of water and watching your blood sugars closely. There are things in our diet which can irritate the bladder lining and cause urinary frequency, urgency, and leakage such as alcohol, caffeine, sugar, and sugar substitutes (like saccharine; more natural sugar substitutes like STEVIA or MONK FRUIT tend to be tolerated best) so try to cut all of this out of the diet. Even consuming foods and drinks with to much citric acid (tomatoes, lemons, limes, oranges, grapefruit, and pineapples) and/or with chemical food preservatives (such as benzoic acid or sulfites such as sulfur dioxide) can cause bladder irritation so keeping your diet full of fresh vegetables instead might help.     IF you received an x-ray today, you will receive an invoice from [facility]. Please contact [facility] at [phone] with questions or concerns regarding your invoice.   IF you received labwork today, you will receive an invoice from [facility]. Please contact [facility] at [phone] with questions or concerns regarding your invoice.   Our billing staff will not be able to assist you with questions regarding bills from these companies.  You will be contacted with the lab results as soon as they are available. The fastest way to get your results is to activate your My Chart account. Instructions are located on the last page of this paperwork. If you have not heard from [place] regarding the results in 2 weeks, please contact this office.      Shoulder Exercises Ask your health care provider which exercises are safe for you. Do exercises exactly as told by your health care provider and adjust them as directed. It is normal to feel mild stretching, pulling, tightness, or discomfort as you do these exercises, but you should stop right away if you feel sudden pain or your pain gets worse.Do not begin these exercises until told by your health care provider. RANGE OF MOTION  EXERCISES These exercises warm up your muscles and joints and improve the movement and flexibility of your shoulder. These exercises also help to relieve pain, numbness, and tingling. These exercises involve stretching your injured shoulder directly. Exercise A: Pendulum  1. Stand near a wall or a surface that you can hold onto for balance. 2. Bend at the waist and let your left / right arm hang straight down. Use your other arm to support you. Keep your back straight and do not lock your knees. 3. Relax your left / right arm and shoulder muscles, and move your hips and your trunk so your left / right arm swings freely. Your arm should swing because of the motion of your body, not because you are using your arm or shoulder muscles. 4. Keep moving your body so your arm swings in the following directions, as told by your health care provider: ? Side to side. ? Forward and backward. ? In clockwise and counterclockwise circles. 5. Continue each motion for __________ seconds, or for as long as told by your health care provider. 6. Slowly return to the starting position. Repeat __________ times. Complete this exercise __________ times a day. Exercise B:Flexion, Standing  1. Stand and hold a broomstick, a cane, or a similar object. Place your hands a little more than shoulder-width apart on the object. Your left / right hand should be palm-up, and your other hand should be palm-down. 2. Keep your elbow straight and keep your shoulder muscles relaxed. Push the stick down with your healthy arm to raise your left / right arm in front  of your body, and then over your head until you feel a stretch in your shoulder. ? Avoid shrugging your shoulder while you raise your arm. Keep your shoulder blade tucked down toward the middle of your back. 3. Hold for __________ seconds. 4. Slowly return to the starting position. Repeat __________ times. Complete this exercise __________ times a day. Exercise C:  Abduction, Standing 1. Stand and hold a broomstick, a cane, or a similar object. Place your hands a little more than shoulder-width apart on the object. Your left / right hand should be palm-up, and your other hand should be palm-down. 2. While keeping your elbow straight and your shoulder muscles relaxed, push the stick across your body toward your left / right side. Raise your left / right arm to the side of your body and then over your head until you feel a stretch in your shoulder. ? Do not raise your arm above shoulder height, unless your health care provider tells you to do that. ? Avoid shrugging your shoulder while you raise your arm. Keep your shoulder blade tucked down toward the middle of your back. 3. Hold for __________ seconds. 4. Slowly return to the starting position. Repeat __________ times. Complete this exercise __________ times a day. Exercise D:Internal Rotation  1. Place your left / right hand behind your back, palm-up. 2. Use your other hand to dangle an exercise band, a towel, or a similar object over your shoulder. Grasp the band with your left / right hand so you are holding onto both ends. 3. Gently pull up on the band until you feel a stretch in the front of your left / right shoulder. ? Avoid shrugging your shoulder while you raise your arm. Keep your shoulder blade tucked down toward the middle of your back. 4. Hold for __________ seconds. 5. Release the stretch by letting go of the band and lowering your hands. Repeat __________ times. Complete this exercise __________ times a day. STRETCHING EXERCISES These exercises warm up your muscles and joints and improve the movement and flexibility of your shoulder. These exercises also help to relieve pain, numbness, and tingling. These exercises are done using your healthy shoulder to help stretch the muscles of your injured shoulder. Exercise E: Warehouse manager (External Rotation and Abduction)  1. Stand in a doorway  with one of your feet slightly in front of the other. This is called a staggered stance. If you cannot reach your forearms to the door frame, stand facing a corner of a room. 2. Choose one of the following positions as told by your health care provider: ? Place your hands and forearms on the door frame above your head. ? Place your hands and forearms on the door frame at the height of your head. ? Place your hands on the door frame at the height of your elbows. 3. Slowly move your weight onto your front foot until you feel a stretch across your chest and in the front of your shoulders. Keep your head and chest upright and keep your abdominal muscles tight. 4. Hold for __________ seconds. 5. To release the stretch, shift your weight to your back foot. Repeat __________ times. Complete this stretch __________ times a day. Exercise F:Extension, Standing 1. Stand and hold a broomstick, a cane, or a similar object behind your back. ? Your hands should be a little wider than shoulder-width apart. ? Your palms should face away from your back. 2. Keeping your elbows straight and keeping your shoulder muscles  relaxed, move the stick away from your body until you feel a stretch in your shoulder. ? Avoid shrugging your shoulders while you move the stick. Keep your shoulder blade tucked down toward the middle of your back. 3. Hold for __________ seconds. 4. Slowly return to the starting position. Repeat __________ times. Complete this exercise __________ times a day. STRENGTHENING EXERCISES These exercises build strength and endurance in your shoulder. Endurance is the ability to use your muscles for a long time, even after they get tired. Exercise G:External Rotation  1. Sit in a stable chair without armrests. 2. Secure an exercise band at elbow height on your left / right side. 3. Place a soft object, such as a folded towel or a small pillow, between your left / right upper arm and your body to move  your elbow a few inches away (about 10 cm) from your side. 4. Hold the end of the band so it is tight and there is no slack. 5. Keeping your elbow pressed against the soft object, move your left / right forearm out, away from your abdomen. Keep your body steady so only your forearm moves. 6. Hold for __________ seconds. 7. Slowly return to the starting position. Repeat __________ times. Complete this exercise __________ times a day. Exercise H:Shoulder Abduction  1. Sit in a stable chair without armrests, or stand. 2. Hold a __________ weight in your left / right hand, or hold an exercise band with both hands. 3. Start with your arms straight down and your left / right palm facing in, toward your body. 4. Slowly lift your left / right hand out to your side. Do not lift your hand above shoulder height unless your health care provider tells you that this is safe. ? Keep your arms straight. ? Avoid shrugging your shoulder while you do this movement. Keep your shoulder blade tucked down toward the middle of your back. 5. Hold for __________ seconds. 6. Slowly lower your arm, and return to the starting position. Repeat __________ times. Complete this exercise __________ times a day. Exercise I:Shoulder Extension 1. Sit in a stable chair without armrests, or stand. 2. Secure an exercise band to a stable object in front of you where it is at shoulder height. 3. Hold one end of the exercise band in each hand. Your palms should face each other. 4. Straighten your elbows and lift your hands up to shoulder height. 5. Step back, away from the secured end of the exercise band, until the band is tight and there is no slack. 6. Squeeze your shoulder blades together as you pull your hands down to the sides of your thighs. Stop when your hands are straight down by your sides. Do not let your hands go behind your body. 7. Hold for __________ seconds. 8. Slowly return to the starting position. Repeat  __________ times. Complete this exercise __________ times a day. Exercise J:Standing Shoulder Row 1. Sit in a stable chair without armrests, or stand. 2. Secure an exercise band to a stable object in front of you so it is at waist height. 3. Hold one end of the exercise band in each hand. Your palms should be in a thumbs-up position. 4. Bend each of your elbows to an "L" shape (about 90 degrees) and keep your upper arms at your sides. 5. Step back until the band is tight and there is no slack. 6. Slowly pull your elbows back behind you. 7. Hold for __________ seconds. 8. Slowly return  to the starting position. Repeat __________ times. Complete this exercise __________ times a day. Exercise K:Shoulder Press-Ups  1. Sit in a stable chair that has armrests. Sit upright, with your feet flat on the floor. 2. Put your hands on the armrests so your elbows are bent and your fingers are pointing forward. Your hands should be about even with the sides of your body. 3. Push down on the armrests and use your arms to lift yourself off of the chair. Straighten your elbows and lift yourself up as much as you comfortably can. ? Move your shoulder blades down, and avoid letting your shoulders move up toward your ears. ? Keep your feet on the ground. As you get stronger, your feet should support less of your body weight as you lift yourself up. 4. Hold for __________ seconds. 5. Slowly lower yourself back into the chair. Repeat __________ times. Complete this exercise __________ times a day. Exercise L: Wall Push-Ups  1. Stand so you are facing a stable wall. Your feet should be about one arm-length away from the wall. 2. Lean forward and place your palms on the wall at shoulder height. 3. Keep your feet flat on the floor as you bend your elbows and lean forward toward the wall. 4. Hold for __________ seconds. 5. Straighten your elbows to push yourself back to the starting position. Repeat __________  times. Complete this exercise __________ times a day. This information is not intended to replace advice given to you by your health care provider. Make sure you discuss any questions you have with your health care provider. Document Released: 03/03/2005 Document Revised: 01/12/2016 Document Reviewed: 12/29/2014 Elsevier Interactive Patient Education  2018 [employer].

## 2017-11-01 LAB — COMPREHENSIVE METABOLIC PANEL
A/G RATIO: 2.2 (ref 1.2–2.2)
ALBUMIN: 4.6 g/dL (ref 3.5–5.5)
ALT: 28 IU/L (ref 0–44)
AST: 13 IU/L (ref 0–40)
Alkaline Phosphatase: 85 IU/L (ref 39–117)
BUN / CREAT RATIO: 19 (ref 9–20)
BUN: 17 mg/dL (ref 6–24)
Bilirubin Total: 1.8 mg/dL — ABNORMAL HIGH (ref 0.0–1.2)
CALCIUM: 9.5 mg/dL (ref 8.7–10.2)
CO2: 23 mmol/L (ref 20–29)
Chloride: 103 mmol/L (ref 96–106)
Creatinine, Ser: 0.89 mg/dL (ref 0.76–1.27)
GFR, EST AFRICAN AMERICAN: 109 mL/min/{1.73_m2} (ref >59)
GFR, EST NON AFRICAN AMERICAN: 94 mL/min/{1.73_m2} (ref >59)
GLOBULIN, TOTAL: 2.1 g/dL (ref 1.5–4.5)
Glucose: 170 mg/dL — ABNORMAL HIGH (ref 65–99)
POTASSIUM: 3.9 mmol/L (ref 3.5–5.2)
SODIUM: 140 mmol/L (ref 134–144)
Total Protein: 6.7 g/dL (ref 6.0–8.5)

## 2017-11-01 LAB — PSA: PROSTATE SPECIFIC AG, SERUM: 1.1 ng/mL (ref 0.0–4.0)

## 2017-11-02 MED FILL — ESCITALOPRAM 10 MG TABLET: 10 | 30 days supply | Qty: 90 | Fill #0

## 2017-11-04 ENCOUNTER — Ambulatory Visit (INDEPENDENT_AMBULATORY_CARE_PROVIDER_SITE_OTHER): Payer: Medicare PPO | Admitting: Family Medicine

## 2017-11-04 DIAGNOSIS — E118 Type 2 diabetes mellitus with unspecified complications: Secondary | ICD-10-CM | POA: Diagnosis not present

## 2017-11-04 DIAGNOSIS — E785 Hyperlipidemia, unspecified: Secondary | ICD-10-CM | POA: Diagnosis not present

## 2017-11-04 DIAGNOSIS — F4310 Post-traumatic stress disorder, unspecified: Secondary | ICD-10-CM | POA: Diagnosis not present

## 2017-11-04 LAB — LIPID PANEL
CHOL/HDL RATIO: 3.8 ratio (ref 0.0–5.0)
Cholesterol, Total: 146 mg/dL (ref 100–199)
HDL: 38 mg/dL — AB (ref >39)
LDL Calculated: 55 mg/dL (ref 0–99)
Triglycerides: 267 mg/dL — ABNORMAL HIGH (ref 0–149)
VLDL Cholesterol Cal: 53 mg/dL — ABNORMAL HIGH (ref 5–40)

## 2017-11-04 NOTE — Progress Notes (Signed)
Pt came for a lab only visit. Pt was not seen by provider.

## 2017-11-07 DIAGNOSIS — F4310 Post-traumatic stress disorder, unspecified: Secondary | ICD-10-CM | POA: Diagnosis not present

## 2017-11-17 ENCOUNTER — Other Ambulatory Visit: Payer: Self-pay | Admitting: Podiatry

## 2017-11-17 ENCOUNTER — Ambulatory Visit (INDEPENDENT_AMBULATORY_CARE_PROVIDER_SITE_OTHER): Payer: Medicare PPO

## 2017-11-17 ENCOUNTER — Encounter: Payer: Self-pay | Admitting: Podiatry

## 2017-11-17 ENCOUNTER — Ambulatory Visit: Payer: Medicare PPO | Admitting: Podiatry

## 2017-11-17 ENCOUNTER — Other Ambulatory Visit: Payer: Self-pay

## 2017-11-17 DIAGNOSIS — M722 Plantar fascial fibromatosis: Secondary | ICD-10-CM

## 2017-11-17 DIAGNOSIS — M67472 Ganglion, left ankle and foot: Secondary | ICD-10-CM

## 2017-11-17 MED ORDER — TRIAMCINOLONE ACETONIDE 10 MG/ML IJ SUSP
10.0000 mg | Freq: Once | INTRAMUSCULAR | Status: AC
  Administered 2017-11-17: 10 mg

## 2017-11-18 NOTE — Progress Notes (Signed)
Subjective:   Patient ID: Matthew Ortiz, male   DOB: 57 y.o.   MRN: [id number]   HPI Patient presents stating he has a nodule in the bottom of his left arch which is been present for around 2 years and is gradually getting painful.  States he has trouble walking on it and also does have diabetes under good control and patient does not smoke and likes to be active   Review of Systems  All other systems reviewed and are negative.       Objective:  Physical Exam  Constitutional: He appears well-developed and well-nourished.  Cardiovascular: Intact distal pulses.  Pulmonary/Chest: Effort normal.  Musculoskeletal: Normal range of motion.  Neurological: He is alert.  Skin: Skin is warm.  Nursing note and vitals reviewed.   Neurovascular status intact muscle strength adequate range of motion within normal limits with patient found to have discomfort in the left arch in the distal portion with a nodule which measures approximately 1.5 x 1.5 cm and appears to be within the plantar fascia.  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  At this point I went ahead and I did a careful injection of the plantar tissue 3 mg Kenalog 5 g like and we will see if this shrinks and reduce his inflammation and if not we will have to consider excision.  Patient tolerated the procedure well  X-ray was negative for signs of calcification or other bony pathology

## 2017-11-21 DIAGNOSIS — F4310 Post-traumatic stress disorder, unspecified: Secondary | ICD-10-CM | POA: Diagnosis not present

## 2017-11-24 DIAGNOSIS — F4310 Post-traumatic stress disorder, unspecified: Secondary | ICD-10-CM | POA: Diagnosis not present

## 2017-11-26 ENCOUNTER — Other Ambulatory Visit: Payer: Self-pay

## 2017-11-26 ENCOUNTER — Ambulatory Visit (INDEPENDENT_AMBULATORY_CARE_PROVIDER_SITE_OTHER): Payer: Medicare PPO | Admitting: Family Medicine

## 2017-11-26 ENCOUNTER — Encounter: Payer: Self-pay | Admitting: Family Medicine

## 2017-11-26 VITALS — BP 120/86 | HR 81 | Temp 98.5°F | Resp 16 | Ht 71.0 in | Wt 175.6 lb

## 2017-11-26 DIAGNOSIS — J0100 Acute maxillary sinusitis, unspecified: Secondary | ICD-10-CM

## 2017-11-26 DIAGNOSIS — J029 Acute pharyngitis, unspecified: Secondary | ICD-10-CM | POA: Diagnosis not present

## 2017-11-26 LAB — POCT RAPID STREP A (OFFICE): Rapid Strep A Screen: NEGATIVE

## 2017-11-26 MED ORDER — CEFDINIR 300 MG PO CAPS: 600.0000 mg | ORAL_CAPSULE | Freq: Every day | ORAL | 0 refills | Status: DC

## 2017-11-26 MED ORDER — GUAIFENESIN-CODEINE 100-10 MG/5ML PO SOLN: 10.0000 mL | Freq: Four times a day (QID) | ORAL | 0 refills | Status: DC | PRN

## 2017-11-26 MED ORDER — CHLORPHENIRAMINE MALEATE 4 MG PO TABS: 4.0000 mg | ORAL_TABLET | Freq: Four times a day (QID) | ORAL | 0 refills | Status: DC | PRN

## 2017-11-26 MED ORDER — METHYLPREDNISOLONE ACETATE 80 MG/ML IJ SUSP
80.0000 mg | Freq: Once | INTRAMUSCULAR | Status: AC
  Administered 2017-11-26: 80 mg via INTRAMUSCULAR

## 2017-11-26 NOTE — Patient Instructions (Addendum)
   IF you received an x-ray today, you will receive an invoice from [facility]. Please contact [facility] at [phone] with questions or concerns regarding your invoice.   IF you received labwork today, you will receive an invoice from [facility]. Please contact [facility] at [phone] with questions or concerns regarding your invoice.   Our billing staff will not be able to assist you with questions regarding bills from these companies.  You will be contacted with the lab results as soon as they are available. The fastest way to get your results is to activate your My Chart account. Instructions are located on the last page of this paperwork. If you have not heard from us regarding the results in 2 weeks, please contact this office.      Sinusitis, Adult Sinusitis is soreness and inflammation of your sinuses. Sinuses are hollow spaces in the bones around your face. Your sinuses are located:  Around your eyes.  In the middle of your forehead.  Behind your nose.  In your cheekbones.  Your sinuses and nasal passages are lined with a stringy fluid (mucus). Mucus normally drains out of your sinuses. When your nasal tissues become inflamed or swollen, the mucus can become trapped or blocked so air cannot flow through your sinuses. This allows bacteria, viruses, and funguses to grow, which leads to infection. Sinusitis can develop quickly and last for 7?10 days (acute) or for more than 12 weeks (chronic). Sinusitis often develops after a cold. What are the causes? This condition is caused by anything that creates swelling in the sinuses or stops mucus from draining, including:  Allergies.  Asthma.  Bacterial or viral infection.  Abnormally shaped bones between the nasal passages.  Nasal growths that contain mucus (nasal polyps).  Narrow sinus openings.  Pollutants, such as chemicals or irritants in the air.  A foreign object stuck in the nose.  A fungal  infection. This is rare.  What increases the risk? The following factors may make you more likely to develop this condition:  Having allergies or asthma.  Having had a recent cold or respiratory tract infection.  Having structural deformities or blockages in your nose or sinuses.  Having a weak immune system.  Doing a lot of swimming or diving.  Overusing nasal sprays.  Smoking.  What are the signs or symptoms? The main symptoms of this condition are pain and a feeling of pressure around the affected sinuses. Other symptoms include:  Upper toothache.  Earache.  Headache.  Bad breath.  Decreased sense of smell and taste.  A cough that may get worse at night.  Fatigue.  Fever.  Thick drainage from your nose. The drainage is often green and it may contain pus (purulent).  Stuffy nose or congestion.  Postnasal drip. This is when extra mucus collects in the throat or back of the nose.  Swelling and warmth over the affected sinuses.  Sore throat.  Sensitivity to light.  How is this diagnosed? This condition is diagnosed based on symptoms, a medical history, and a physical exam. To find out if your condition is acute or chronic, your health care provider may:  Look in your nose for signs of nasal polyps.  Tap over the affected sinus to check for signs of infection.  View the inside of your sinuses using an imaging device that has a light attached (endoscope).  If your health care provider suspects that you have chronic sinusitis, you may also:  Be tested for allergies.    Have a sample of mucus taken from your nose (nasal culture) and checked for bacteria.  Have a mucus sample examined to see if your sinusitis is related to an allergy.  If your sinusitis does not respond to treatment and it lasts longer than 8 weeks, you may have an MRI or CT scan to check your sinuses. These scans also help to determine how severe your infection is. In rare cases, a bone  biopsy may be done to rule out more serious types of fungal sinus disease. How is this treated? Treatment for sinusitis depends on the cause and whether your condition is chronic or acute. If a virus is causing your sinusitis, your symptoms will go away on their own within 10 days. You may be given medicines to relieve your symptoms, including:  Topical nasal decongestants. They shrink swollen nasal passages and let mucus drain from your sinuses.  Antihistamines. These drugs block inflammation that is triggered by allergies. This can help to ease swelling in your nose and sinuses.  Topical nasal corticosteroids. These are nasal sprays that ease inflammation and swelling in your nose and sinuses.  Nasal saline washes. These rinses can help to get rid of thick mucus in your nose.  If your condition is caused by bacteria, you will be given an antibiotic medicine. If your condition is caused by a fungus, you will be given an antifungal medicine. Surgery may be needed to correct underlying conditions, such as narrow nasal passages. Surgery may also be needed to remove polyps. Follow these instructions at home: Medicines  Take, use, or apply over-the-counter and prescription medicines only as told by your health care provider. These may include nasal sprays.  If you were prescribed an antibiotic medicine, take it as told by your health care provider. Do not stop taking the antibiotic even if you start to feel better. Hydrate and Humidify  Drink enough water to keep your urine clear or pale yellow. Staying hydrated will help to thin your mucus.  Use a cool mist humidifier to keep the humidity level in your home above 50%.  Inhale steam for 10-15 minutes, 3-4 times a day or as told by your health care provider. You can do this in the bathroom while a hot shower is running.  Limit your exposure to cool or dry air. Rest  Rest as much as possible.  Sleep with your head raised  (elevated).  Make sure to get enough sleep each night. General instructions  Apply a warm, moist washcloth to your face 3-4 times a day or as told by your health care provider. This will help with discomfort.  Wash your hands often with soap and water to reduce your exposure to viruses and other germs. If soap and water are not available, use hand sanitizer.  Do not smoke. Avoid being around people who are smoking (secondhand smoke).  Keep all follow-up visits as told by your health care provider. This is important. Contact a health care provider if:  You have a fever.  Your symptoms get worse.  Your symptoms do not improve within 10 days. Get help right away if:  You have a severe headache.  You have persistent vomiting.  You have pain or swelling around your face or eyes.  You have vision problems.  You develop confusion.  Your neck is stiff.  You have trouble breathing. This information is not intended to replace advice given to you by your health care provider. Make sure you discuss any questions you   have with your health care provider. Document Released: 04/19/2005 Document Revised: 12/14/2015 Document Reviewed: 02/12/2015 Elsevier Interactive Patient Education  2018 [employer]  

## 2017-11-26 NOTE — Progress Notes (Signed)
By signing my name below, I, Schuyler Bain, attest that this documentation has been prepared under the direction and in the presence of Dr. Delman Cheadle. Electronically Signed: Baldwin [place], Scribe 11/26/2017 at 10:30 AM.   Subjective:    Patient ID: Matthew Ortiz, male    DOB: [birth year], 57 y.o.   MRN: [id number]   Chief Complaint  Patient presents with  . Sore Throat    x 1 wk, (WRITTEN Rx pt lives out of town)  . nasal drainage    yellow mucus, x 1 wk  . nasal congestion  . body aches  . Cough    yellow phlegm   HPI Matthew Ortiz is a 56 y.o. male who presents to Primary Care at [facility] complaining of a week of sore throat, nasal discharge of yellow mucous, body aches, and productive cough.   He notes that these symptoms worsened significantly throughout the week, and has been using [employer]. He notes that his mucous has been very thick and sticky. He also endorses a fever that presents at night.   The pt notes that in the past he has tolerated Prednisone well overall, with the exception of difficulty sleeping.    Patient Active Problem List   Diagnosis Date Noted  . Asthma 10/11/2017  . Chronic rhinitis 10/11/2017  . Psychophysiological insomnia 08/30/2017  . Major depressive disorder in partial remission ([place]) 08/30/2017  . Anxiety and depression 08/30/2017  . Chronically on benzodiazepine therapy 08/30/2017  . Hepatic steatosis 07/12/2017  . Vitamin D deficiency 07/12/2017  . Idiopathic hematuria 07/12/2017  . Essential hypertension 07/12/2017  . Hyperlipidemia LDL goal <100 07/12/2017  . Type 2 diabetes mellitus with complication, without long-term current use of insulin ([place]) 07/12/2017  . PTSD (post-traumatic stress disorder) 01/22/2014  . Insomnia related to another mental disorder 01/22/2014  . GAD (generalized anxiety disorder) 01/22/2014  . Skin-picking disorder 01/22/2014  . Bipolar 2 disorder, major depressive episode ([place]) 01/22/2014   Past Medical  History:  Diagnosis Date  . Anxiety   . Asthma   . COPD (chronic obstructive pulmonary disease) ([place])   . Depression   . Diabetes mellitus, type II ([place])   . GERD (gastroesophageal reflux disease)   . HTN (hypertension)   . IBS (irritable bowel syndrome)   . Palpitations    Past Surgical History:  Procedure Laterality Date  . APPENDECTOMY    . FUNDOPLASTY TRANSTHORACIC    . HERNIA REPAIR    . RECONSTRUCTION OF NOSE     Allergies  Allergen Reactions  . Bee Pollen   . Milk-Related Compounds Diarrhea  . Penicillins Rash   Prior to Admission medications   Medication Sig Start Date End Date Taking? Authorizing Provider  albuterol (PROAIR HFA) 108 (90 Base) MCG/ACT inhaler Inhale 1-2 puffs into the lungs every 6 (six) hours as needed for wheezing or shortness of breath. 08/11/17   Shawnee Knapp, MD  atorvastatin (LIPITOR) 40 MG tablet Take 1 tablet (40 mg total) by mouth daily. 10/31/17   Shawnee Knapp, MD  clonazePAM (KLONOPIN) 1 MG tablet Take 1 tablet (1 mg total) by mouth 3 (three) times daily as needed for anxiety. 10/06/17   Charlcie Cradle, MD  escitalopram (LEXAPRO) 10 MG tablet Take 3 tablets (30 mg total) by mouth daily. 10/06/17 10/06/18  Charlcie Cradle, MD  Fish Oil-Cholecalciferol (FISH OIL + D3 PO) Take by mouth.    [provider]  fluticasone (FLONASE) 50 MCG/ACT nasal spray Place 1 spray into both nostrils  daily. 08/11/17   Shawnee Knapp, MD  fluticasone (FLOVENT HFA) 110 MCG/ACT inhaler Inhale 2 puffs into the lungs 2 (two) times daily. 10/11/17   Juanito Doom, MD  lithium 600 MG capsule Take 1 capsule (600 mg total) by mouth 2 (two) times daily with a meal. 10/06/17   Charlcie Cradle, MD  meloxicam (MOBIC) 15 MG tablet Take 1 tablet (15 mg total) by mouth daily. 10/31/17   Shawnee Knapp, MD  metFORMIN (GLUCOPHAGE) 500 MG tablet  09/22/17   [provider]  mirtazapine (REMERON) 30 MG tablet Take 2 tablets (60 mg total) by mouth at bedtime. 10/06/17 10/06/18  Charlcie Cradle, MD  omeprazole (PRILOSEC) 40 MG capsule Take 40 mg by mouth daily.    [provider]  tamsulosin (FLOMAX) 0.4 MG CAPS capsule Take 1 capsule (0.4 mg total) by mouth daily. 08/11/17   Shawnee Knapp, MD  traZODone (DESYREL) 100 MG tablet Take 1 tablet (100 mg total) by mouth at bedtime. 10/06/17   Charlcie Cradle, MD   Social History   Socioeconomic History  . Marital status: Married    Spouse name: Not on file  . Number of children: Not on file  . Years of education: Not on file  . Highest education level: Not on file  Occupational History  . Not on file  Social Needs  . Financial resource strain: Not on file  . Food insecurity:    Worry: Not on file    Inability: Not on file  . Transportation needs:    Medical: Not on file    Non-medical: Not on file  Tobacco Use  . Smoking status: Former Smoker    Packs/day: 1.50    Years: 20.00    Pack years: 30.00    Last attempt to quit: 05/03/1998    Years since quitting: 19.5  . Smokeless tobacco: Never Used  Substance and Sexual Activity  . Alcohol use: No    Comment: socially-small amount of wine monthly - 1 glass   . Drug use: No    Types: Marijuana    Comment: last times was 1 yr ago  . Sexual activity: Never  Lifestyle  . Physical activity:    Days per week: Not on file    Minutes per session: Not on file  . Stress: Not on file  Relationships  . Social connections:    Talks on phone: Not on file    Gets together: Not on file    Attends religious service: Not on file    Active member of club or organization: Not on file    Attends meetings of clubs or organizations: Not on file    Relationship status: Not on file  . Intimate partner violence:    Fear of current or ex partner: Not on file    Emotionally abused: Not on file    Physically abused: Not on file    Forced sexual activity: Not on file  Other Topics Concern  . Not on file  Social History Narrative  . Not on file     Review of Systems    Constitutional: Positive for fatigue and fever (Night time).  HENT: Positive for congestion, rhinorrhea, sinus pressure and sore throat.   Respiratory: Positive for cough (with yellow mucous).   Cardiovascular: Negative for chest pain.  Gastrointestinal: Negative for abdominal distention, nausea and vomiting.  Musculoskeletal: Positive for arthralgias.  Hematological: Negative for adenopathy.  Psychiatric/Behavioral: Negative for agitation, behavioral problems, confusion, decreased  concentration and dysphoric mood. The patient is not nervous/anxious and is not hyperactive.        Objective:   Physical Exam  Constitutional: He is oriented to person, place, and time. He appears well-developed and well-nourished. No distress.  HENT:  Head: Normocephalic and atraumatic.  Right Ear: External ear and ear canal normal. Tympanic membrane is erythematous and bulging.  Left Ear: External ear and ear canal normal. Tympanic membrane is erythematous and bulging.  Nose: Mucosal edema and rhinorrhea present. Right sinus exhibits maxillary sinus tenderness. Left sinus exhibits maxillary sinus tenderness.  Mouth/Throat: Uvula is midline. Mucous membranes are dry. Posterior oropharyngeal erythema present. No oropharyngeal exudate or posterior oropharyngeal edema.  Eyes: Conjunctivae are normal. Right eye exhibits no discharge. Left eye exhibits no discharge. No scleral icterus.  Neck: Normal range of motion. Neck supple. No JVD present. No thyromegaly present.  Cardiovascular: Normal rate, regular rhythm, normal heart sounds and intact distal pulses.  Pulmonary/Chest: Effort normal and breath sounds normal. No respiratory distress. He has no wheezes. He has no rales. He exhibits no tenderness.  Abdominal: Soft.  Lymphadenopathy:       Head (right side): Submandibular adenopathy present.       Head (left side): Submandibular adenopathy present.    He has no cervical adenopathy.       Right: No  supraclavicular adenopathy present.       Left: No supraclavicular adenopathy present.  Neurological: He is alert and oriented to person, place, and time.  Skin: Skin is warm and dry. He is not diaphoretic. No erythema.  Psychiatric: He has a normal mood and affect. His behavior is normal. Judgment and thought content normal.    Vitals:   11/26/17 0934  BP: 120/86  Pulse: 81  Resp: 16  Temp: 98.5 F (36.9 C)  TempSrc: Oral  SpO2: 97%  Weight: 175 lb 9.6 oz (79.7 kg)  Height: 5\' 11"  (1.803 m)    Results for orders placed or performed in visit on 11/26/17  POCT rapid strep A  Result Value Ref Range   Rapid Strep A Screen Negative Negative      Assessment & Plan:   1. Sore throat   2. Acute non-recurrent maxillary sinusitis     Orders Placed This Encounter  Procedures  . POCT rapid strep A    Meds ordered this encounter  Medications  . cefdinir (OMNICEF) 300 MG capsule    Sig: Take 2 capsules (600 mg total) by mouth daily.    Dispense:  20 capsule    Refill:  0  . methylPREDNISolone acetate (DEPO-MEDROL) injection 80 mg  . chlorpheniramine (CHLOR-TRIMETON) 4 MG tablet    Sig: Take 1 tablet (4 mg total) by mouth every 6 (six) hours as needed for rhinitis.    Dispense:  14 tablet    Refill:  0  . guaiFENesin-codeine 100-10 MG/5ML syrup    Sig: Take 10 mLs by mouth every 6 (six) hours as needed for cough. THIS WILL CAUSE SEDATION. TAKE AT NIGHT.    Dispense:  120 mL    Refill:  0    I personally performed the services described in this documentation, which was scribed in my presence. The recorded information has been reviewed and considered, and addended by me as needed.   Delman Cheadle, M.D.  Primary Care at [facility] [address] [place], [place] [address] [phone] phone [phone] fax  01/18/18 12:25 PM

## 2017-11-28 DIAGNOSIS — F4310 Post-traumatic stress disorder, unspecified: Secondary | ICD-10-CM | POA: Diagnosis not present

## 2017-12-01 DIAGNOSIS — F4310 Post-traumatic stress disorder, unspecified: Secondary | ICD-10-CM | POA: Diagnosis not present

## 2017-12-05 DIAGNOSIS — F4310 Post-traumatic stress disorder, unspecified: Secondary | ICD-10-CM | POA: Diagnosis not present

## 2017-12-08 ENCOUNTER — Ambulatory Visit (HOSPITAL_COMMUNITY): Payer: Medicare PPO | Admitting: Psychiatry

## 2017-12-08 ENCOUNTER — Encounter (HOSPITAL_COMMUNITY): Payer: Self-pay | Admitting: Psychiatry

## 2017-12-08 DIAGNOSIS — Z811 Family history of alcohol abuse and dependence: Secondary | ICD-10-CM

## 2017-12-08 DIAGNOSIS — F4310 Post-traumatic stress disorder, unspecified: Secondary | ICD-10-CM

## 2017-12-08 DIAGNOSIS — Z818 Family history of other mental and behavioral disorders: Secondary | ICD-10-CM | POA: Diagnosis not present

## 2017-12-08 DIAGNOSIS — F5105 Insomnia due to other mental disorder: Secondary | ICD-10-CM | POA: Diagnosis not present

## 2017-12-08 DIAGNOSIS — F411 Generalized anxiety disorder: Secondary | ICD-10-CM | POA: Diagnosis not present

## 2017-12-08 DIAGNOSIS — Z813 Family history of other psychoactive substance abuse and dependence: Secondary | ICD-10-CM | POA: Diagnosis not present

## 2017-12-08 DIAGNOSIS — F3181 Bipolar II disorder: Secondary | ICD-10-CM

## 2017-12-08 MED ORDER — ESCITALOPRAM OXALATE 10 MG PO TABS: 30.0000 mg | ORAL_TABLET | Freq: Every day | ORAL | 0 refills | Status: DC

## 2017-12-08 MED ORDER — TRAZODONE HCL 150 MG PO TABS: 150.0000 mg | ORAL_TABLET | Freq: Every day | ORAL | 0 refills | Status: DC

## 2017-12-08 MED ORDER — CLONAZEPAM 1 MG PO TABS: 1.0000 mg | ORAL_TABLET | Freq: Three times a day (TID) | ORAL | 2 refills | Status: DC | PRN

## 2017-12-08 MED ORDER — LITHIUM CARBONATE 600 MG PO CAPS: 600.0000 mg | ORAL_CAPSULE | Freq: Two times a day (BID) | ORAL | 0 refills | Status: DC

## 2017-12-08 MED ORDER — MIRTAZAPINE 30 MG PO TABS: 60.0000 mg | ORAL_TABLET | Freq: Every day | ORAL | 0 refills | Status: DC

## 2017-12-08 MED FILL — ESCITALOPRAM 10 MG TABLET: 10 | 30 days supply | Qty: 90 | Fill #0

## 2017-12-08 MED FILL — traZODone HCL 150 MG TABS: 150 | 90 days supply | Qty: 90 | Fill #0

## 2017-12-08 MED FILL — clonazePAM 1 MG TABS: 1 | 30 days supply | Qty: 90 | Fill #0
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
## 2017-12-12 DIAGNOSIS — F4310 Post-traumatic stress disorder, unspecified: Secondary | ICD-10-CM | POA: Diagnosis not present

## 2017-12-15 DIAGNOSIS — F4310 Post-traumatic stress disorder, unspecified: Secondary | ICD-10-CM | POA: Diagnosis not present

## 2017-12-19 DIAGNOSIS — F4310 Post-traumatic stress disorder, unspecified: Secondary | ICD-10-CM | POA: Diagnosis not present

## 2017-12-21 DIAGNOSIS — R351 Nocturia: Secondary | ICD-10-CM | POA: Diagnosis not present

## 2017-12-21 DIAGNOSIS — N5201 Erectile dysfunction due to arterial insufficiency: Secondary | ICD-10-CM | POA: Diagnosis not present

## 2017-12-21 DIAGNOSIS — N3942 Incontinence without sensory awareness: Secondary | ICD-10-CM | POA: Diagnosis not present

## 2017-12-21 DIAGNOSIS — R3912 Poor urinary stream: Secondary | ICD-10-CM | POA: Diagnosis not present

## 2017-12-21 DIAGNOSIS — N401 Enlarged prostate with lower urinary tract symptoms: Secondary | ICD-10-CM | POA: Diagnosis not present

## 2017-12-21 DIAGNOSIS — R350 Frequency of micturition: Secondary | ICD-10-CM | POA: Diagnosis not present

## 2017-12-22 ENCOUNTER — Ambulatory Visit: Payer: Medicare PPO | Admitting: Podiatry

## 2017-12-22 ENCOUNTER — Encounter: Payer: Self-pay | Admitting: Podiatry

## 2017-12-22 DIAGNOSIS — M1990 Unspecified osteoarthritis, unspecified site: Secondary | ICD-10-CM | POA: Insufficient documentation

## 2017-12-22 DIAGNOSIS — N469 Male infertility, unspecified: Secondary | ICD-10-CM | POA: Insufficient documentation

## 2017-12-22 DIAGNOSIS — M722 Plantar fascial fibromatosis: Secondary | ICD-10-CM | POA: Diagnosis not present

## 2017-12-22 DIAGNOSIS — F4310 Post-traumatic stress disorder, unspecified: Secondary | ICD-10-CM | POA: Diagnosis not present

## 2017-12-22 NOTE — Progress Notes (Signed)
Subjective:   Patient ID: Matthew Ortiz, male   DOB: 57 y.o.   MRN: [id number]   HPI Patient presents stating he is having a lot of pain in the bottom of the foot with this nodule and it only got better for a few weeks with the medication and the padding and he is tried other conservative care without relief.  States he is having trouble walking on his foot   ROS      Objective:  Physical Exam  Neurovascular status intact with large mass on the plantar aspect of the left arch measuring approximately 1.5 x 1.5 cm that when pressed is becoming very tender with multilobular appearance.  There is no proximal edema erythema noted     Assessment:  Soft tissue mass plantar aspect left which may be fibroma or could be other pathological type of mass     Plan:  Due to the fact is becoming increasingly painful and has grown in size I recommended excision of the mass with pathology and I explained the procedure to the patient and allow him to read consent form going over all possible complications as outlined in the fact there is no long-term guarantee that this will solve the problem.  I do not know what pathology will reveal but based on what it is there may require further treatment and he understands this and he understands total recovery will take about 6 months.  I also went ahead and I did dispense air fracture walker as he will need to be in this postoperatively and I want him to get used to it before surgery.  Patient signed consent form understanding all risks and is encouraged to call with any questions prior to the procedure which I do consider somewhat of an emergency to do

## 2017-12-22 NOTE — Patient Instructions (Signed)
Pre-Operative Instructions  Congratulations, you have decided to take an important step towards improving your quality of life.  You can be assured that the doctors and staff at [facility] will be with you every step of the way.  Here are some important things you should know:  1. Plan to be at the surgery center/hospital at least 1 (one) hour prior to your scheduled time, unless otherwise directed by the surgical center/hospital staff.  You must have a responsible adult accompany you, remain during the surgery and drive you home.  Make sure you have directions to the surgical center/hospital to ensure you arrive on time. 2. If you are having surgery at [facility] or [facility], you will need a copy of your medical history and physical form from your family physician within one month prior to the date of surgery. We will give you a form for your primary physician to complete.  3. We make every effort to accommodate the date you request for surgery.  However, there are times where surgery dates or times have to be moved.  We will contact you as soon as possible if a change in schedule is required.   4. No aspirin/ibuprofen for one week before surgery.  If you are on aspirin, any non-steroidal anti-inflammatory medications (Mobic, Aleve, Ibuprofen) should not be taken seven (7) days prior to your surgery.  You make take Tylenol for pain prior to surgery.  5. Medications - If you are taking daily heart and blood pressure medications, seizure, reflux, allergy, asthma, anxiety, pain or diabetes medications, make sure you notify the surgery center/hospital before the day of surgery so they can tell you which medications you should take or avoid the day of surgery. 6. No food or drink after midnight the night before surgery unless directed otherwise by surgical center/hospital staff. 7. No alcoholic beverages 24-hours prior to surgery.  No smoking 24-hours prior or 24-hours after  surgery. 8. Wear loose pants or shorts. They should be loose enough to fit over bandages, boots, and casts. 9. Don't wear slip-on shoes. Sneakers are preferred. 10. Bring your boot with you to the surgery center/hospital.  Also bring crutches or a walker if your physician has prescribed it for you.  If you do not have this equipment, it will be provided for you after surgery. 11. If you have not been contacted by the surgery center/hospital by the day before your surgery, call to confirm the date and time of your surgery. 12. Leave-time from work may vary depending on the type of surgery you have.  Appropriate arrangements should be made prior to surgery with your employer. 13. Prescriptions will be provided immediately following surgery by your doctor.  Fill these as soon as possible after surgery and take the medication as directed. Pain medications will not be refilled on weekends and must be approved by the doctor. 14. Remove nail polish on the operative foot and avoid getting pedicures prior to surgery. 15. Wash the night before surgery.  The night before surgery wash the foot and leg well with water and the antibacterial soap provided. Be sure to pay special attention to beneath the toenails and in between the toes.  Wash for at least three (3) minutes. Rinse thoroughly with water and dry well with a towel.  Perform this wash unless told not to do so by your physician.  Enclosed: 1 Ice pack (please put in freezer the night before surgery)   1 Hibiclens skin cleaner     Pre-op instructions  If you have any questions regarding the instructions, please do not hesitate to call our office.  [place]: [address], [place], [place] [address] -- [phone]  [place]: [address] [place], [place] [address] -- [phone]  [place]: [address]  [place], [place] [address] -- [phone]  [place]: [address], [address], [place], [place] [address] -- [phone]  Website:  [email or web address] 

## 2017-12-26 ENCOUNTER — Telehealth: Payer: Self-pay | Admitting: *Deleted

## 2017-12-26 DIAGNOSIS — F4310 Post-traumatic stress disorder, unspecified: Secondary | ICD-10-CM | POA: Diagnosis not present

## 2017-12-26 NOTE — Telephone Encounter (Addendum)
I called [employer] to check on the authorization status for 28062.  I was informed by Rodrigo Ran that authorization for that code is not done by [facility].  He said I needed to contact [employer].  I called and spoke to Towner at [facility] to see if the patient's surgery needed to be authorized.  She stated the cpt code 28062 does not need authorization.

## 2017-12-27 ENCOUNTER — Encounter: Payer: Self-pay | Admitting: Podiatry

## 2017-12-27 DIAGNOSIS — M722 Plantar fascial fibromatosis: Secondary | ICD-10-CM | POA: Diagnosis not present

## 2017-12-27 DIAGNOSIS — D2122 Benign neoplasm of connective and other soft tissue of left lower limb, including hip: Secondary | ICD-10-CM | POA: Diagnosis not present

## 2017-12-27 DIAGNOSIS — D492 Neoplasm of unspecified behavior of bone, soft tissue, and skin: Secondary | ICD-10-CM | POA: Diagnosis not present

## 2017-12-27 MED FILL — HYDROCODON-APAP 10-325: 10-325 | 4 days supply | Qty: 25 | Fill #0

## 2018-01-02 DIAGNOSIS — F4310 Post-traumatic stress disorder, unspecified: Secondary | ICD-10-CM | POA: Diagnosis not present

## 2018-01-03 ENCOUNTER — Encounter: Payer: Self-pay | Admitting: Podiatry

## 2018-01-04 ENCOUNTER — Ambulatory Visit (INDEPENDENT_AMBULATORY_CARE_PROVIDER_SITE_OTHER): Payer: Medicare PPO | Admitting: Podiatry

## 2018-01-04 VITALS — BP 144/98 | HR 97 | Temp 96.6°F

## 2018-01-04 DIAGNOSIS — M722 Plantar fascial fibromatosis: Secondary | ICD-10-CM

## 2018-01-05 DIAGNOSIS — F4310 Post-traumatic stress disorder, unspecified: Secondary | ICD-10-CM | POA: Diagnosis not present

## 2018-01-05 NOTE — Progress Notes (Signed)
Subjective:   Patient ID: Matthew Ortiz, male   DOB: 57 y.o.   MRN: [id number]   HPI Patient presents stating doing well with minimal discomfort   ROS      Objective:  Physical Exam  Neurovascular status intact negative Homans sign noted wound edges well coapted plantar left with what appears to be excellent excision of the plantar fibroma present     Assessment:  Doing well with wound edges well coapted stitches in place and only mild swelling advised     Plan:  Advised on continued elevation compression and immobilization and reapplied sterile dressing.  See back 2 weeks and gave strict instructions of any issues were to occur to contact us immediately

## 2018-01-09 ENCOUNTER — Other Ambulatory Visit: Payer: Self-pay

## 2018-01-09 ENCOUNTER — Encounter: Payer: Self-pay | Admitting: Emergency Medicine

## 2018-01-09 ENCOUNTER — Ambulatory Visit (INDEPENDENT_AMBULATORY_CARE_PROVIDER_SITE_OTHER): Payer: Medicare PPO | Admitting: Emergency Medicine

## 2018-01-09 VITALS — BP 112/77 | HR 83 | Temp 98.8°F | Resp 16 | Ht 73.0 in | Wt 171.2 lb

## 2018-01-09 DIAGNOSIS — R059 Cough, unspecified: Secondary | ICD-10-CM

## 2018-01-09 DIAGNOSIS — J22 Unspecified acute lower respiratory infection: Secondary | ICD-10-CM

## 2018-01-09 DIAGNOSIS — R0981 Nasal congestion: Secondary | ICD-10-CM

## 2018-01-09 DIAGNOSIS — R05 Cough: Secondary | ICD-10-CM

## 2018-01-09 MED ORDER — BENZONATATE 200 MG PO CAPS: 200.0000 mg | ORAL_CAPSULE | Freq: Two times a day (BID) | ORAL | 0 refills | Status: DC | PRN

## 2018-01-09 MED ORDER — AZITHROMYCIN 250 MG PO TABS: ORAL_TABLET | ORAL | 0 refills | Status: DC

## 2018-01-09 MED ORDER — FEXOFENADINE-PSEUDOEPHED ER 60-120 MG PO TB12: 1.0000 | ORAL_TABLET | Freq: Two times a day (BID) | ORAL | 3 refills | Status: AC

## 2018-01-09 MED ORDER — PROMETHAZINE-CODEINE 6.25-10 MG/5ML PO SYRP: 5.0000 mL | ORAL_SOLUTION | Freq: Every evening | ORAL | 0 refills | Status: DC | PRN

## 2018-01-09 MED FILL — PROMETHAZINE W/COD SYRUP: 6.25-10 | 24 days supply | Qty: 120 | Fill #0

## 2018-01-09 MED FILL — BENZONATATE 200 MG CAPS: 200 | 10 days supply | Qty: 20 | Fill #0

## 2018-01-09 MED FILL — AZITHROMYCIN 250 MG TABLET: 250 | 5 days supply | Qty: 6 | Fill #0

## 2018-01-09 NOTE — Patient Instructions (Addendum)
     If you have lab work done today you will be contacted with your lab results within the next 2 weeks.  If you have not heard from us then please contact us. The fastest way to get your results is to register for My Chart.   IF you received an x-ray today, you will receive an invoice from [facility]. Please contact [facility] at [phone] with questions or concerns regarding your invoice.   IF you received labwork today, you will receive an invoice from [facility]. Please contact [facility] at [phone] with questions or concerns regarding your invoice.   Our billing staff will not be able to assist you with questions regarding bills from these companies.  You will be contacted with the lab results as soon as they are available. The fastest way to get your results is to activate your My Chart account. Instructions are located on the last page of this paperwork. If you have not heard from us regarding the results in 2 weeks, please contact this office.     Cough, Adult A cough helps to clear your throat and lungs. A cough may last only 2-3 weeks (acute), or it may last longer than 8 weeks (chronic). Many different things can cause a cough. A cough may be a sign of an illness or another medical condition. Follow these instructions at home:  Pay attention to any changes in your cough.  Take medicines only as told by your doctor. ? If you were prescribed an antibiotic medicine, take it as told by your doctor. Do not stop taking it even if you start to feel better. ? Talk with your doctor before you try using a cough medicine.  Drink enough fluid to keep your pee (urine) clear or pale yellow.  If the air is dry, use a cold steam vaporizer or humidifier in your home.  Stay away from things that make you cough at work or at home.  If your cough is worse at night, try using extra pillows to raise your head up higher while you sleep.  Do not smoke, and try not to be  around smoke. If you need help quitting, ask your doctor.  Do not have caffeine.  Do not drink alcohol.  Rest as needed. Contact a doctor if:  You have new problems (symptoms).  You cough up yellow fluid (pus).  Your cough does not get better after 2-3 weeks, or your cough gets worse.  Medicine does not help your cough and you are not sleeping well.  You have pain that gets worse or pain that is not helped with medicine.  You have a fever.  You are losing weight and you do not know why.  You have night sweats. Get help right away if:  You cough up blood.  You have trouble breathing.  Your heartbeat is very fast. This information is not intended to replace advice given to you by your health care provider. Make sure you discuss any questions you have with your health care provider. Document Released: 12/31/2010 Document Revised: 09/25/2015 Document Reviewed: 06/26/2014 Elsevier Interactive Patient Education  2018 Elsevier Inc.  

## 2018-01-09 NOTE — Progress Notes (Signed)
Matthew Ortiz 57 y.o.   Chief Complaint  Patient presents with  . Cough    x 5 days productive with white/yellow mucus  . Sinus Problem    with facial  pressure     HISTORY OF PRESENT ILLNESS: This is a 57 y.o. male complaining of 4 to 5-day history of productive cough with sinus drainage, runny nose, scratchy throat, generalized achiness and increased mucus production.  No recent traveling.  No other significant symptoms.  HPI   Prior to Admission medications   Medication Sig Start Date End Date Taking? Authorizing Provider  albuterol (PROAIR HFA) 108 (90 Base) MCG/ACT inhaler Inhale 1-2 puffs into the lungs every 6 (six) hours as needed for wheezing or shortness of breath. 08/11/17  Yes Shawnee Knapp, MD  atorvastatin (LIPITOR) 40 MG tablet Take 1 tablet (40 mg total) by mouth daily. 10/31/17  Yes Shawnee Knapp, MD  chlorpheniramine (CHLOR-TRIMETON) 4 MG tablet Take 1 tablet (4 mg total) by mouth every 6 (six) hours as needed for rhinitis. 11/26/17  Yes Shawnee Knapp, MD  clonazePAM (KLONOPIN) 1 MG tablet Take 1 tablet (1 mg total) by mouth 3 (three) times daily as needed for anxiety. 12/08/17  Yes Charlcie Cradle, MD  escitalopram (LEXAPRO) 10 MG tablet Take 3 tablets (30 mg total) by mouth daily. 12/08/17 12/08/18 Yes Charlcie Cradle, MD  Fish Oil-Cholecalciferol (FISH OIL + D3 PO) Take by mouth.   Yes [provider]  fluticasone (FLONASE) 50 MCG/ACT nasal spray Place 1 spray into both nostrils daily. 08/11/17  Yes Shawnee Knapp, MD  fluticasone (FLOVENT HFA) 110 MCG/ACT inhaler Inhale 2 puffs into the lungs 2 (two) times daily. 10/11/17  Yes Juanito Doom, MD  lithium 600 MG capsule Take 1 capsule (600 mg total) by mouth 2 (two) times daily with a meal. 12/08/17  Yes Charlcie Cradle, MD  meloxicam (MOBIC) 15 MG tablet Take 1 tablet (15 mg total) by mouth daily. 10/31/17  Yes Shawnee Knapp, MD  metFORMIN (GLUCOPHAGE) 500 MG tablet  09/22/17  Yes [provider]  mirtazapine (REMERON)  30 MG tablet Take 2 tablets (60 mg total) by mouth at bedtime. 12/08/17 12/08/18 Yes Charlcie Cradle, MD  omeprazole (PRILOSEC) 40 MG capsule Take 40 mg by mouth daily.   Yes [provider]  tamsulosin (FLOMAX) 0.4 MG CAPS capsule Take 1 capsule (0.4 mg total) by mouth daily. 08/11/17  Yes Shawnee Knapp, MD  traZODone (DESYREL) 150 MG tablet Take 1 tablet (150 mg total) by mouth at bedtime. 12/08/17  Yes Charlcie Cradle, MD  cefdinir (OMNICEF) 300 MG capsule Take 2 capsules (600 mg total) by mouth daily. 11/26/17   Shawnee Knapp, MD  guaiFENesin-codeine 100-10 MG/5ML syrup Take 10 mLs by mouth every 6 (six) hours as needed for cough. THIS WILL CAUSE SEDATION. TAKE AT NIGHT. Patient not taking: Reported on 01/09/2018 11/26/17   Shawnee Knapp, MD    Allergies  Allergen Reactions  . Bee Pollen   . Milk-Related Compounds Diarrhea  . Penicillins Rash    Patient Active Problem List   Diagnosis Date Noted  . Arthritis 12/22/2017  . Infertility male 12/22/2017  . Asthma 10/11/2017  . Chronic rhinitis 10/11/2017  . Psychophysiological insomnia 08/30/2017  . Major depressive disorder in partial remission ([place]) 08/30/2017  . Anxiety and depression 08/30/2017  . Chronically on benzodiazepine therapy 08/30/2017  . Hepatic steatosis 07/12/2017  . Vitamin D deficiency 07/12/2017  . Idiopathic hematuria 07/12/2017  .  Essential hypertension 07/12/2017  . Hyperlipidemia LDL goal <100 07/12/2017  . Type 2 diabetes mellitus with complication, without long-term current use of insulin ([place]) 07/12/2017  . Diverticulitis of large intestine 01/29/2014  . PTSD (post-traumatic stress disorder) 01/22/2014  . Insomnia related to another mental disorder 01/22/2014  . GAD (generalized anxiety disorder) 01/22/2014  . Skin-picking disorder 01/22/2014  . Bipolar 2 disorder, major depressive episode ([place]) 01/22/2014  . Benign prostatic hyperplasia 05/03/2011  . Hemorrhoids, internal 05/03/2011  . Male erectile  disorder 05/03/2011  . Bipolar affective ([place]) 04/02/2011  . Diverticulitis 04/02/2011  . GERD (gastroesophageal reflux disease) 04/02/2011  . IBS (irritable bowel syndrome) 04/02/2011  . Osteoarthritis of shoulder 04/02/2011  . Rotator cuff tear, left 04/02/2011  . Snoring 04/02/2011  . Staph skin infection 04/02/2011  . Tachycardia 04/02/2011    Past Medical History:  Diagnosis Date  . Anxiety   . Asthma   . COPD (chronic obstructive pulmonary disease) ([place])   . Depression   . Diabetes mellitus, type II ([place])   . GERD (gastroesophageal reflux disease)   . HTN (hypertension)   . IBS (irritable bowel syndrome)   . Palpitations     Past Surgical History:  Procedure Laterality Date  . APPENDECTOMY    . FUNDOPLASTY TRANSTHORACIC    . HERNIA REPAIR    . RECONSTRUCTION OF NOSE      Social History   Socioeconomic History  . Marital status: Married    Spouse name: Not on file  . Number of children: Not on file  . Years of education: Not on file  . Highest education level: Not on file  Occupational History  . Not on file  Social Needs  . Financial resource strain: Not on file  . Food insecurity:    Worry: Not on file    Inability: Not on file  . Transportation needs:    Medical: Not on file    Non-medical: Not on file  Tobacco Use  . Smoking status: Former Smoker    Packs/day: 1.50    Years: 20.00    Pack years: 30.00    Last attempt to quit: 05/03/1998    Years since quitting: 19.7  . Smokeless tobacco: Never Used  Substance and Sexual Activity  . Alcohol use: No    Comment: socially-small amount of wine monthly - 1 glass   . Drug use: No    Types: Marijuana    Comment: last times was over 1 yr ago  . Sexual activity: Never  Lifestyle  . Physical activity:    Days per week: Not on file    Minutes per session: Not on file  . Stress: Not on file  Relationships  . Social connections:    Talks on phone: Not on file    Gets together: Not on file    Attends  religious service: Not on file    Active member of club or organization: Not on file    Attends meetings of clubs or organizations: Not on file    Relationship status: Not on file  . Intimate partner violence:    Fear of current or ex partner: Not on file    Emotionally abused: Not on file    Physically abused: Not on file    Forced sexual activity: Not on file  Other Topics Concern  . Not on file  Social History Narrative  . Not on file    Family History  Problem Relation Age of Onset  . Depression  Mother   . Anxiety disorder Mother   . Schizophrenia Father   . Suicidality Father   . Suicidality Paternal Grandfather   . Drug abuse Paternal Grandfather   . Alcohol abuse Paternal Grandfather      Review of Systems  Constitutional: Negative.  Negative for chills and fever.  HENT: Positive for congestion, sinus pain and sore throat. Negative for ear pain.   Eyes: Negative.  Negative for discharge and redness.  Respiratory: Positive for cough and sputum production. Negative for shortness of breath and wheezing.   Cardiovascular: Negative.  Negative for chest pain and palpitations.  Gastrointestinal: Negative.  Negative for abdominal pain, diarrhea, nausea and vomiting.  Genitourinary: Negative.  Negative for dysuria and hematuria.  Musculoskeletal: Negative.  Negative for back pain, joint pain, myalgias and neck pain.  Skin: Negative.  Negative for rash.  Neurological: Negative.  Negative for dizziness and headaches.  Endo/Heme/Allergies: Negative.   All other systems reviewed and are negative.  Vitals:   01/09/18 1324  BP: 112/77  Pulse: 83  Resp: 16  Temp: 98.8 F (37.1 C)  SpO2: 97%     Physical Exam  Constitutional: He is oriented to person, place, and time. He appears well-developed and well-nourished.  HENT:  Head: Normocephalic and atraumatic.  Nose: Right sinus exhibits maxillary sinus tenderness. Left sinus exhibits maxillary sinus tenderness.    Mouth/Throat: Oropharynx is clear and moist. No oropharyngeal exudate.  Eyes: Pupils are equal, round, and reactive to light. Conjunctivae and EOM are normal.  Neck: Normal range of motion. Neck supple.  Cardiovascular: Normal rate, regular rhythm and normal heart sounds.  Pulmonary/Chest: Effort normal and breath sounds normal. No respiratory distress.  Abdominal: Soft. He exhibits no distension. There is no tenderness.  Musculoskeletal: He exhibits no edema.  Orthopedic boot on left foot, status post recent foot surgery.  Lymphadenopathy:    He has no cervical adenopathy.  Neurological: He is alert and oriented to person, place, and time. No sensory deficit. He exhibits normal muscle tone.  Skin: Skin is warm and dry. Capillary refill takes less than 2 seconds.  Psychiatric: He has a normal mood and affect. His behavior is normal.  Vitals reviewed.   A total of 25 minutes was spent in the room with the patient, greater than 50% of which was in counseling/coordination of care regarding differential diagnosis, treatment, medications, prognosis, and need for follow-up if no better or worse.   ASSESSMENT & PLAN: Ricci was seen today for cough and sinus problem.  Diagnoses and all orders for this visit:  Cough -     benzonatate (TESSALON) 200 MG capsule; Take 1 capsule (200 mg total) by mouth 2 (two) times daily as needed for cough. -     promethazine-codeine (PHENERGAN WITH CODEINE) 6.25-10 MG/5ML syrup; Take 5 mLs by mouth at bedtime as needed for cough.  Lower resp. tract infection -     azithromycin (ZITHROMAX) 250 MG tablet; Sig as indicated  Sinus congestion -     fexofenadine-pseudoephedrine (ALLEGRA-D ALLERGY & CONGESTION) 60-120 MG 12 hr tablet; Take 1 tablet by mouth 2 (two) times daily for 7 days.    Patient Instructions       If you have lab work done today you will be contacted with your lab results within the next 2 weeks.  If you have not heard from [place] then  please contact us. The fastest way to get your results is to register for My Chart.   IF you received  an x-ray today, you will receive an invoice from [facility]. Please contact [facility] at [phone] with questions or concerns regarding your invoice.   IF you received labwork today, you will receive an invoice from [facility]. Please contact [facility] at [phone] with questions or concerns regarding your invoice.   Our billing staff will not be able to assist you with questions regarding bills from these companies.  You will be contacted with the lab results as soon as they are available. The fastest way to get your results is to activate your My Chart account. Instructions are located on the last page of this paperwork. If you have not heard from [place] regarding the results in 2 weeks, please contact this office.     Cough, Adult A cough helps to clear your throat and lungs. A cough may last only 2-3 weeks (acute), or it may last longer than 8 weeks (chronic). Many different things can cause a cough. A cough may be a sign of an illness or another medical condition. Follow these instructions at home:  Pay attention to any changes in your cough.  Take medicines only as told by your doctor. ? If you were prescribed an antibiotic medicine, take it as told by your doctor. Do not stop taking it even if you start to feel better. ? Talk with your doctor before you try using a cough medicine.  Drink enough fluid to keep your pee (urine) clear or pale yellow.  If the air is dry, use a cold steam vaporizer or humidifier in your home.  Stay away from things that make you cough at work or at home.  If your cough is worse at night, try using extra pillows to raise your head up higher while you sleep.  Do not smoke, and try not to be around smoke. If you need help quitting, ask your doctor.  Do not have caffeine.  Do not drink alcohol.  Rest as needed. Contact a doctor  if:  You have new problems (symptoms).  You cough up yellow fluid (pus).  Your cough does not get better after 2-3 weeks, or your cough gets worse.  Medicine does not help your cough and you are not sleeping well.  You have pain that gets worse or pain that is not helped with medicine.  You have a fever.  You are losing weight and you do not know why.  You have night sweats. Get help right away if:  You cough up blood.  You have trouble breathing.  Your heartbeat is very fast. This information is not intended to replace advice given to you by your health care provider. Make sure you discuss any questions you have with your health care provider. Document Released: 12/31/2010 Document Revised: 09/25/2015 Document Reviewed: 06/26/2014 Elsevier Interactive Patient Education  2018 Elsevier Inc.      Agustina Caroli, MD [facility]

## 2018-01-10 ENCOUNTER — Telehealth (HOSPITAL_COMMUNITY): Payer: Self-pay

## 2018-01-10 DIAGNOSIS — F5105 Insomnia due to other mental disorder: Secondary | ICD-10-CM

## 2018-01-10 NOTE — Telephone Encounter (Signed)
Patient is calling because he can not sleep, he is currently on Trazodone, and would like to increase dose or change medication. Please review and advise, thank you

## 2018-01-12 ENCOUNTER — Ambulatory Visit: Payer: Medicare PPO | Admitting: Family Medicine

## 2018-01-12 DIAGNOSIS — F4310 Post-traumatic stress disorder, unspecified: Secondary | ICD-10-CM | POA: Diagnosis not present

## 2018-01-12 MED ORDER — TRAZODONE HCL 100 MG PO TABS: 200.0000 mg | ORAL_TABLET | Freq: Every day | ORAL | 0 refills | Status: DC

## 2018-01-12 NOTE — Telephone Encounter (Signed)
Increase Trazodone to 200 mg

## 2018-01-16 DIAGNOSIS — F4310 Post-traumatic stress disorder, unspecified: Secondary | ICD-10-CM | POA: Diagnosis not present

## 2018-01-18 ENCOUNTER — Encounter: Payer: Self-pay | Admitting: Podiatry

## 2018-01-18 ENCOUNTER — Ambulatory Visit (INDEPENDENT_AMBULATORY_CARE_PROVIDER_SITE_OTHER): Payer: Medicare PPO | Admitting: Podiatry

## 2018-01-18 DIAGNOSIS — E119 Type 2 diabetes mellitus without complications: Secondary | ICD-10-CM | POA: Diagnosis not present

## 2018-01-18 DIAGNOSIS — Z09 Encounter for follow-up examination after completed treatment for conditions other than malignant neoplasm: Secondary | ICD-10-CM

## 2018-01-18 DIAGNOSIS — M722 Plantar fascial fibromatosis: Secondary | ICD-10-CM | POA: Diagnosis not present

## 2018-01-18 NOTE — Progress Notes (Signed)
This patient returns to the office following removal of plantar fibroma from the plantar aspect of his left foot. . . Patient had removal of plantar fibroma and was seen last by Dr. Paulla Dolly on 01/04/2018.  He returns to the office today stating he's having no pain and discomfort and has followed his instructions.  He is very pleased with his surgery.    Objective  Neurovascular status is intact.  Good wound coaptation is noted with no evidence of any redness, swelling or pain.  S/P foot surgery   ROV.  , patient returns to the office and is healing nicely.  . Sutures were removed.  Steri-Strips were applied to  the incision site to maintain coaptation.  Patient was also told that he can start to ambulate with his surgical shoe.  He was told to return to his [place] Rile if there is gapping at the incision site. Patient is to return in 3 weeks to see Dr.  Paulla Dolly.   Gardiner Barefoot DPM

## 2018-01-19 ENCOUNTER — Ambulatory Visit (INDEPENDENT_AMBULATORY_CARE_PROVIDER_SITE_OTHER): Payer: Medicare PPO | Admitting: Emergency Medicine

## 2018-01-19 ENCOUNTER — Encounter: Payer: Self-pay | Admitting: Emergency Medicine

## 2018-01-19 ENCOUNTER — Other Ambulatory Visit: Payer: Self-pay

## 2018-01-19 VITALS — BP 112/78 | HR 117 | Temp 98.1°F | Resp 16 | Wt 172.8 lb

## 2018-01-19 DIAGNOSIS — R059 Cough, unspecified: Secondary | ICD-10-CM

## 2018-01-19 DIAGNOSIS — J22 Unspecified acute lower respiratory infection: Secondary | ICD-10-CM

## 2018-01-19 DIAGNOSIS — R05 Cough: Secondary | ICD-10-CM

## 2018-01-19 DIAGNOSIS — F4310 Post-traumatic stress disorder, unspecified: Secondary | ICD-10-CM | POA: Diagnosis not present

## 2018-01-19 NOTE — Patient Instructions (Addendum)
     If you have lab work done today you will be contacted with your lab results within the next 2 weeks.  If you have not heard from us then please contact us. The fastest way to get your results is to register for My Chart.   IF you received an x-ray today, you will receive an invoice from [facility]. Please contact [facility] at [phone] with questions or concerns regarding your invoice.   IF you received labwork today, you will receive an invoice from [facility]. Please contact [facility] at [phone] with questions or concerns regarding your invoice.   Our billing staff will not be able to assist you with questions regarding bills from these companies.  You will be contacted with the lab results as soon as they are available. The fastest way to get your results is to activate your My Chart account. Instructions are located on the last page of this paperwork. If you have not heard from us regarding the results in 2 weeks, please contact this office.     Cough, Adult A cough helps to clear your throat and lungs. A cough may last only 2-3 weeks (acute), or it may last longer than 8 weeks (chronic). Many different things can cause a cough. A cough may be a sign of an illness or another medical condition. Follow these instructions at home:  Pay attention to any changes in your cough.  Take medicines only as told by your doctor. ? If you were prescribed an antibiotic medicine, take it as told by your doctor. Do not stop taking it even if you start to feel better. ? Talk with your doctor before you try using a cough medicine.  Drink enough fluid to keep your pee (urine) clear or pale yellow.  If the air is dry, use a cold steam vaporizer or humidifier in your home.  Stay away from things that make you cough at work or at home.  If your cough is worse at night, try using extra pillows to raise your head up higher while you sleep.  Do not smoke, and try not to be  around smoke. If you need help quitting, ask your doctor.  Do not have caffeine.  Do not drink alcohol.  Rest as needed. Contact a doctor if:  You have new problems (symptoms).  You cough up yellow fluid (pus).  Your cough does not get better after 2-3 weeks, or your cough gets worse.  Medicine does not help your cough and you are not sleeping well.  You have pain that gets worse or pain that is not helped with medicine.  You have a fever.  You are losing weight and you do not know why.  You have night sweats. Get help right away if:  You cough up blood.  You have trouble breathing.  Your heartbeat is very fast. This information is not intended to replace advice given to you by your health care provider. Make sure you discuss any questions you have with your health care provider. Document Released: 12/31/2010 Document Revised: 09/25/2015 Document Reviewed: 06/26/2014 Elsevier Interactive Patient Education  2018 Elsevier Inc.  

## 2018-01-19 NOTE — Progress Notes (Signed)
Matthew Ortiz 57 y.o.   Chief Complaint  Patient presents with  . Cough    FOLLOW UP -small yellow and white mucus    HISTORY OF PRESENT ILLNESS: This is a 57 y.o. male seen by me on 01/09/2018 with cough and lower respiratory infection.  Started on azithromycin, Tessalon, and Phenergan with codeine.  Doing much better.  No new complaints.  Cough still lingering but better.  75-80% better.  HPI   Prior to Admission medications   Medication Sig Start Date End Date Taking? Authorizing Provider  albuterol (PROAIR HFA) 108 (90 Base) MCG/ACT inhaler Inhale 1-2 puffs into the lungs every 6 (six) hours as needed for wheezing or shortness of breath. 08/11/17  Yes Shawnee Knapp, MD  atorvastatin (LIPITOR) 40 MG tablet Take 1 tablet (40 mg total) by mouth daily. 10/31/17  Yes Shawnee Knapp, MD  benzonatate (TESSALON) 200 MG capsule Take 1 capsule (200 mg total) by mouth 2 (two) times daily as needed for cough. 01/09/18  Yes Dellene Mcgroarty, Ines Bloomer, MD  cefdinir (OMNICEF) 300 MG capsule Take 2 capsules (600 mg total) by mouth daily. 11/26/17  Yes Shawnee Knapp, MD  chlorpheniramine (CHLOR-TRIMETON) 4 MG tablet Take 1 tablet (4 mg total) by mouth every 6 (six) hours as needed for rhinitis. 11/26/17  Yes Shawnee Knapp, MD  clonazePAM (KLONOPIN) 1 MG tablet Take 1 tablet (1 mg total) by mouth 3 (three) times daily as needed for anxiety. 12/08/17  Yes Charlcie Cradle, MD  escitalopram (LEXAPRO) 10 MG tablet Take 3 tablets (30 mg total) by mouth daily. 12/08/17 12/08/18 Yes Charlcie Cradle, MD  Fish Oil-Cholecalciferol (FISH OIL + D3 PO) Take by mouth.   Yes [provider]  fluticasone (FLONASE) 50 MCG/ACT nasal spray Place 1 spray into both nostrils daily. 08/11/17  Yes Shawnee Knapp, MD  fluticasone (FLOVENT HFA) 110 MCG/ACT inhaler Inhale 2 puffs into the lungs 2 (two) times daily. 10/11/17  Yes Juanito Doom, MD  lithium 600 MG capsule Take 1 capsule (600 mg total) by mouth 2 (two) times daily with a meal. 12/08/17   Yes Charlcie Cradle, MD  meloxicam (MOBIC) 15 MG tablet Take 1 tablet (15 mg total) by mouth daily. 10/31/17  Yes Shawnee Knapp, MD  metFORMIN (GLUCOPHAGE) 500 MG tablet  09/22/17  Yes [provider]  mirtazapine (REMERON) 30 MG tablet Take 2 tablets (60 mg total) by mouth at bedtime. 12/08/17 12/08/18 Yes Charlcie Cradle, MD  omeprazole (PRILOSEC) 40 MG capsule Take 40 mg by mouth daily.   Yes [provider]  promethazine-codeine (PHENERGAN WITH CODEINE) 6.25-10 MG/5ML syrup Take 5 mLs by mouth at bedtime as needed for cough. 01/09/18  Yes Gilman Olazabal, Ines Bloomer, MD  tamsulosin (FLOMAX) 0.4 MG CAPS capsule Take 1 capsule (0.4 mg total) by mouth daily. 08/11/17  Yes Shawnee Knapp, MD  traZODone (DESYREL) 100 MG tablet Take 2 tablets (200 mg total) by mouth at bedtime. 01/12/18  Yes Charlcie Cradle, MD  guaiFENesin-codeine 100-10 MG/5ML syrup Take 10 mLs by mouth every 6 (six) hours as needed for cough. THIS WILL CAUSE SEDATION. TAKE AT NIGHT. Patient not taking: Reported on 01/19/2018 11/26/17   Shawnee Knapp, MD    Allergies  Allergen Reactions  . Bee Pollen   . Milk-Related Compounds Diarrhea  . Penicillins Rash    Patient Active Problem List   Diagnosis Date Noted  . Cough 01/09/2018  . Lower resp. tract infection 01/09/2018  . Sinus congestion  01/09/2018  . Arthritis 12/22/2017  . Infertility male 12/22/2017  . Asthma 10/11/2017  . Chronic rhinitis 10/11/2017  . Psychophysiological insomnia 08/30/2017  . Major depressive disorder in partial remission ([place]) 08/30/2017  . Anxiety and depression 08/30/2017  . Chronically on benzodiazepine therapy 08/30/2017  . Hepatic steatosis 07/12/2017  . Vitamin D deficiency 07/12/2017  . Idiopathic hematuria 07/12/2017  . Essential hypertension 07/12/2017  . Hyperlipidemia LDL goal <100 07/12/2017  . Type 2 diabetes mellitus with complication, without long-term current use of insulin ([place]) 07/12/2017  . Diverticulitis of large intestine  01/29/2014  . PTSD (post-traumatic stress disorder) 01/22/2014  . Insomnia related to another mental disorder 01/22/2014  . GAD (generalized anxiety disorder) 01/22/2014  . Skin-picking disorder 01/22/2014  . Bipolar 2 disorder, major depressive episode ([place]) 01/22/2014  . Benign prostatic hyperplasia 05/03/2011  . Hemorrhoids, internal 05/03/2011  . Male erectile disorder 05/03/2011  . Bipolar affective ([place]) 04/02/2011  . Diverticulitis 04/02/2011  . GERD (gastroesophageal reflux disease) 04/02/2011  . IBS (irritable bowel syndrome) 04/02/2011  . Osteoarthritis of shoulder 04/02/2011  . Snoring 04/02/2011  . Tachycardia 04/02/2011    Past Medical History:  Diagnosis Date  . Anxiety   . Asthma   . COPD (chronic obstructive pulmonary disease) ([place])   . Depression   . Diabetes mellitus, type II ([place])   . GERD (gastroesophageal reflux disease)   . HTN (hypertension)   . IBS (irritable bowel syndrome)   . Palpitations     Past Surgical History:  Procedure Laterality Date  . APPENDECTOMY    . FUNDOPLASTY TRANSTHORACIC    . HERNIA REPAIR    . RECONSTRUCTION OF NOSE      Social History   Socioeconomic History  . Marital status: Married    Spouse name: Not on file  . Number of children: Not on file  . Years of education: Not on file  . Highest education level: Not on file  Occupational History  . Not on file  Social Needs  . Financial resource strain: Not on file  . Food insecurity:    Worry: Not on file    Inability: Not on file  . Transportation needs:    Medical: Not on file    Non-medical: Not on file  Tobacco Use  . Smoking status: Former Smoker    Packs/day: 1.50    Years: 20.00    Pack years: 30.00    Last attempt to quit: 05/03/1998    Years since quitting: 19.7  . Smokeless tobacco: Never Used  Substance and Sexual Activity  . Alcohol use: No    Comment: socially-small amount of wine monthly - 1 glass   . Drug use: No    Types: Marijuana     Comment: last times was over 1 yr ago  . Sexual activity: Never  Lifestyle  . Physical activity:    Days per week: Not on file    Minutes per session: Not on file  . Stress: Not on file  Relationships  . Social connections:    Talks on phone: Not on file    Gets together: Not on file    Attends religious service: Not on file    Active member of club or organization: Not on file    Attends meetings of clubs or organizations: Not on file    Relationship status: Not on file  . Intimate partner violence:    Fear of current or ex partner: Not on file    Emotionally abused:  Not on file    Physically abused: Not on file    Forced sexual activity: Not on file  Other Topics Concern  . Not on file  Social History Narrative  . Not on file    Family History  Problem Relation Age of Onset  . Depression Mother   . Anxiety disorder Mother   . Schizophrenia Father   . Suicidality Father   . Suicidality Paternal Grandfather   . Drug abuse Paternal Grandfather   . Alcohol abuse Paternal Grandfather      Review of Systems  Constitutional: Negative.  Negative for chills and fever.  HENT: Negative.  Negative for hearing loss.   Eyes: Negative.  Negative for blurred vision and double vision.  Respiratory: Positive for cough. Negative for shortness of breath and wheezing.   Cardiovascular: Negative.  Negative for chest pain and palpitations.  Gastrointestinal: Negative.  Negative for abdominal pain, diarrhea, nausea and vomiting.  Genitourinary: Negative.  Negative for dysuria.  Musculoskeletal: Negative.  Negative for myalgias and neck pain.  Skin: Negative.  Negative for rash.  Neurological: Negative.  Negative for dizziness and headaches.  Endo/Heme/Allergies: Negative.   All other systems reviewed and are negative.   Vitals:   01/19/18 1356  BP: 112/78  Pulse: (!) 117  Resp: 16  Temp: 98.1 F (36.7 C)  SpO2: 94%    Physical Exam  Constitutional: He is oriented to person,  place, and time. He appears well-developed and well-nourished.  HENT:  Head: Normocephalic and atraumatic.  Nose: Nose normal.  Mouth/Throat: Oropharynx is clear and moist.  Eyes: Pupils are equal, round, and reactive to light. Conjunctivae and EOM are normal.  Neck: Normal range of motion. Neck supple. No JVD present. No thyromegaly present.  Cardiovascular: Normal rate, regular rhythm and normal heart sounds.  Pulmonary/Chest: Effort normal and breath sounds normal.  Abdominal: Soft. There is no tenderness.  Musculoskeletal: Normal range of motion.  Lymphadenopathy:    He has no cervical adenopathy.  Neurological: He is alert and oriented to person, place, and time.  Skin: Skin is warm and dry. Capillary refill takes less than 2 seconds.  Psychiatric: He has a normal mood and affect. His behavior is normal.  Vitals reviewed.  Lower resp. tract infection Much improved.  Clinically stable.  Finished antibiotic.  75% better.  Continue present treatment.  Follow-up as needed.   A total of 25 minutes was spent in the room with the patient, greater than 50% of which was in counseling/coordination of care regarding diagnosis, treatment, medications, and need for follow-up if no better or worse.  ASSESSMENT & PLAN: Moataz was seen today for cough.  Diagnoses and all orders for this visit:  Cough  Lower resp. tract infection Comments: improved    Patient Instructions       If you have lab work done today you will be contacted with your lab results within the next 2 weeks.  If you have not heard from [place] then please contact us. The fastest way to get your results is to register for My Chart.   IF you received an x-ray today, you will receive an invoice from [facility]. Please contact [facility] at [phone] with questions or concerns regarding your invoice.   IF you received labwork today, you will receive an invoice from [place] [facility]. Please contact [facility]  at [phone] with questions or concerns regarding your invoice.   Our billing staff will not be able to assist you with questions regarding bills  from these companies.  You will be contacted with the lab results as soon as they are available. The fastest way to get your results is to activate your My Chart account. Instructions are located on the last page of this paperwork. If you have not heard from [place] regarding the results in 2 weeks, please contact this office.     Cough, Adult A cough helps to clear your throat and lungs. A cough may last only 2-3 weeks (acute), or it may last longer than 8 weeks (chronic). Many different things can cause a cough. A cough may be a sign of an illness or another medical condition. Follow these instructions at home:  Pay attention to any changes in your cough.  Take medicines only as told by your doctor. ? If you were prescribed an antibiotic medicine, take it as told by your doctor. Do not stop taking it even if you start to feel better. ? Talk with your doctor before you try using a cough medicine.  Drink enough fluid to keep your pee (urine) clear or pale yellow.  If the air is dry, use a cold steam vaporizer or humidifier in your home.  Stay away from things that make you cough at work or at home.  If your cough is worse at night, try using extra pillows to raise your head up higher while you sleep.  Do not smoke, and try not to be around smoke. If you need help quitting, ask your doctor.  Do not have caffeine.  Do not drink alcohol.  Rest as needed. Contact a doctor if:  You have new problems (symptoms).  You cough up yellow fluid (pus).  Your cough does not get better after 2-3 weeks, or your cough gets worse.  Medicine does not help your cough and you are not sleeping well.  You have pain that gets worse or pain that is not helped with medicine.  You have a fever.  You are losing weight and you do not know why.  You have  night sweats. Get help right away if:  You cough up blood.  You have trouble breathing.  Your heartbeat is very fast. This information is not intended to replace advice given to you by your health care provider. Make sure you discuss any questions you have with your health care provider. Document Released: 12/31/2010 Document Revised: 09/25/2015 Document Reviewed: 06/26/2014 Elsevier Interactive Patient Education  2018 Elsevier Inc.      Agustina Caroli, MD [facility]

## 2018-01-19 NOTE — Assessment & Plan Note (Addendum)
Much improved.  Clinically stable.  Finished antibiotic.  75% better.  Continue present treatment.  Follow-up as needed.

## 2018-01-23 DIAGNOSIS — F4310 Post-traumatic stress disorder, unspecified: Secondary | ICD-10-CM | POA: Diagnosis not present

## 2018-01-26 DIAGNOSIS — F4310 Post-traumatic stress disorder, unspecified: Secondary | ICD-10-CM | POA: Diagnosis not present

## 2018-01-30 DIAGNOSIS — F4310 Post-traumatic stress disorder, unspecified: Secondary | ICD-10-CM | POA: Diagnosis not present

## 2018-02-02 DIAGNOSIS — F4310 Post-traumatic stress disorder, unspecified: Secondary | ICD-10-CM | POA: Diagnosis not present

## 2018-02-03 DIAGNOSIS — I10 Essential (primary) hypertension: Secondary | ICD-10-CM | POA: Diagnosis not present

## 2018-02-03 DIAGNOSIS — E785 Hyperlipidemia, unspecified: Secondary | ICD-10-CM | POA: Diagnosis not present

## 2018-02-03 DIAGNOSIS — H547 Unspecified visual loss: Secondary | ICD-10-CM | POA: Diagnosis not present

## 2018-02-03 DIAGNOSIS — J45909 Unspecified asthma, uncomplicated: Secondary | ICD-10-CM | POA: Diagnosis not present

## 2018-02-03 DIAGNOSIS — F319 Bipolar disorder, unspecified: Secondary | ICD-10-CM | POA: Diagnosis not present

## 2018-02-03 DIAGNOSIS — F418 Other specified anxiety disorders: Secondary | ICD-10-CM | POA: Diagnosis not present

## 2018-02-03 DIAGNOSIS — E119 Type 2 diabetes mellitus without complications: Secondary | ICD-10-CM | POA: Diagnosis not present

## 2018-02-03 DIAGNOSIS — H409 Unspecified glaucoma: Secondary | ICD-10-CM | POA: Diagnosis not present

## 2018-02-03 DIAGNOSIS — G4700 Insomnia, unspecified: Secondary | ICD-10-CM | POA: Diagnosis not present

## 2018-02-06 DIAGNOSIS — F4310 Post-traumatic stress disorder, unspecified: Secondary | ICD-10-CM | POA: Diagnosis not present

## 2018-02-07 DIAGNOSIS — R351 Nocturia: Secondary | ICD-10-CM | POA: Diagnosis not present

## 2018-02-07 DIAGNOSIS — N3942 Incontinence without sensory awareness: Secondary | ICD-10-CM | POA: Diagnosis not present

## 2018-02-07 DIAGNOSIS — R350 Frequency of micturition: Secondary | ICD-10-CM | POA: Diagnosis not present

## 2018-02-07 DIAGNOSIS — R3912 Poor urinary stream: Secondary | ICD-10-CM | POA: Diagnosis not present

## 2018-02-09 ENCOUNTER — Encounter (HOSPITAL_COMMUNITY): Payer: Self-pay | Admitting: Psychiatry

## 2018-02-09 ENCOUNTER — Ambulatory Visit (HOSPITAL_COMMUNITY): Payer: Medicare PPO | Admitting: Psychiatry

## 2018-02-09 ENCOUNTER — Other Ambulatory Visit: Payer: Self-pay

## 2018-02-09 ENCOUNTER — Encounter: Payer: Medicare PPO | Admitting: Podiatry

## 2018-02-09 VITALS — BP 131/83 | HR 67 | Resp 16 | Wt 172.0 lb

## 2018-02-09 DIAGNOSIS — F4310 Post-traumatic stress disorder, unspecified: Secondary | ICD-10-CM

## 2018-02-09 DIAGNOSIS — F411 Generalized anxiety disorder: Secondary | ICD-10-CM | POA: Diagnosis not present

## 2018-02-09 DIAGNOSIS — F5105 Insomnia due to other mental disorder: Secondary | ICD-10-CM

## 2018-02-09 DIAGNOSIS — F3181 Bipolar II disorder: Secondary | ICD-10-CM | POA: Diagnosis not present

## 2018-02-09 MED ORDER — ESCITALOPRAM OXALATE 10 MG PO TABS: 30.0000 mg | ORAL_TABLET | Freq: Every day | ORAL | 0 refills | Status: DC

## 2018-02-09 MED ORDER — CLONAZEPAM 1 MG PO TABS: 1.0000 mg | ORAL_TABLET | Freq: Three times a day (TID) | ORAL | 2 refills | Status: DC | PRN

## 2018-02-09 MED ORDER — LITHIUM CARBONATE 600 MG PO CAPS: 600.0000 mg | ORAL_CAPSULE | Freq: Two times a day (BID) | ORAL | 0 refills | Status: DC

## 2018-02-09 MED ORDER — TRAZODONE HCL 100 MG PO TABS: ORAL_TABLET | ORAL | 0 refills | Status: DC

## 2018-02-09 MED ORDER — MIRTAZAPINE 30 MG PO TABS: 60.0000 mg | ORAL_TABLET | Freq: Every day | ORAL | 0 refills | Status: DC

## 2018-02-09 MED FILL — LITHIUM CARBONATE 600 MG CA: 600 | 90 days supply | Qty: 180 | Fill #0

## 2018-02-09 MED FILL — clonazePAM 1 MG TABS: 1 | 30 days supply | Qty: 90 | Fill #0

## 2018-02-09 MED FILL — traZODone HCL 100 MG TABS: 100 | 90 days supply | Qty: 225 | Fill #0

## 2018-02-09 NOTE — Progress Notes (Signed)
BH MD/PA/NP OP Progress Note  02/09/2018 2:17 PM Matthew Ortiz  MRN:  [id number]  Chief Complaint:  Chief Complaint    Post-Traumatic Stress Disorder; Depression; Anxiety     HPI: Patient continues to meet with his therapist but they have temporarily suspended E MDR and are working on some of the issues that came out with the previous sessions.  He continues to have all of the same symptoms of PTSD he has been describing.  In addition he is depressed but denies any suicidal or homicidal ideations.  He is also anxious.  He reports that he has no support from his wife or brother.  Sleep is on the poor side.  He takes his medication around 10 and falls asleep around 11 but wakes up somewhere around 3 to 4:00 every morning.  He would like to sleep longer.  He is taking his Klonopin.  He denies any manic or hypomanic-like symptoms.    Visit Diagnosis:    ICD-10-CM   1. PTSD (post-traumatic stress disorder) F43.10 clonazePAM (KLONOPIN) 1 MG tablet    escitalopram (LEXAPRO) 10 MG tablet    mirtazapine (REMERON) 30 MG tablet  2. Insomnia related to another mental disorder F51.05 clonazePAM (KLONOPIN) 1 MG tablet    mirtazapine (REMERON) 30 MG tablet    traZODone (DESYREL) 100 MG tablet  3. GAD (generalized anxiety disorder) F41.1 escitalopram (LEXAPRO) 10 MG tablet    mirtazapine (REMERON) 30 MG tablet  4. Bipolar II disorder (HCC) F31.81 lithium 600 MG capsule      Past Psychiatric History:  Anxiety: Yes Bipolar Disorder: Yes Depression: Yes Mania: Yes Psychosis: No Schizophrenia: No Personality Disorder: No Hospitalization for psychiatric illness: Yes History of Electroconvulsive Shock Therapy: No Prior Suicide Attempts: Yes Previous meds: Seroquel, Abilify, Elavil, Wellbutrin, Ambien, Paxil, Effexor, Buspar   Past Medical History:  Past Medical History:  Diagnosis Date  . Anxiety   . Asthma   . COPD (chronic obstructive pulmonary disease) ([place])   . Depression   . Diabetes  mellitus, type II ([place])   . GERD (gastroesophageal reflux disease)   . HTN (hypertension)   . IBS (irritable bowel syndrome)   . Palpitations     Past Surgical History:  Procedure Laterality Date  . APPENDECTOMY    . FUNDOPLASTY TRANSTHORACIC    . HERNIA REPAIR    . RECONSTRUCTION OF NOSE      Family Psychiatric History:  Family History  Problem Relation Age of Onset  . Depression Mother   . Anxiety disorder Mother   . Schizophrenia Father   . Suicidality Father   . Suicidality Paternal Grandfather   . Drug abuse Paternal Grandfather   . Alcohol abuse Paternal Grandfather     Social History:  Social History   Socioeconomic History  . Marital status: Married    Spouse name: Not on file  . Number of children: Not on file  . Years of education: Not on file  . Highest education level: Not on file  Occupational History  . Not on file  Social Needs  . Financial resource strain: Not on file  . Food insecurity:    Worry: Not on file    Inability: Not on file  . Transportation needs:    Medical: Not on file    Non-medical: Not on file  Tobacco Use  . Smoking status: Former Smoker    Packs/day: 1.50    Years: 20.00    Pack years: 30.00    Last attempt to quit:  05/03/1998    Years since quitting: 19.7  . Smokeless tobacco: Never Used  Substance and Sexual Activity  . Alcohol use: No    Comment: socially-small amount of wine monthly - 1 glass   . Drug use: No    Types: Marijuana    Comment: last times was over 1 yr ago  . Sexual activity: Never  Lifestyle  . Physical activity:    Days per week: Not on file    Minutes per session: Not on file  . Stress: Not on file  Relationships  . Social connections:    Talks on phone: Not on file    Gets together: Not on file    Attends religious service: Not on file    Active member of club or organization: Not on file    Attends meetings of clubs or organizations: Not on file    Relationship status: Not on file  Other  Topics Concern  . Not on file  Social History Narrative  . Not on file    Allergies:  Allergies  Allergen Reactions  . Bee Pollen   . Milk-Related Compounds Diarrhea  . Penicillins Rash    Metabolic Disorder Labs: Lab Results  Component Value Date   HGBA1C 5.5 10/31/2017   No results found for: PROLACTIN Lab Results  Component Value Date   CHOL 146 11/04/2017   TRIG 267 (H) 11/04/2017   HDL 38 (L) 11/04/2017   CHOLHDL 3.8 11/04/2017   LDLCALC 55 11/04/2017   Lab Results  Component Value Date   TSH 2.490 07/12/2017    Therapeutic Level Labs: No results found for: LITHIUM No results found for: VALPROATE No components found for:  CBMZ  Current Medications: Current Outpatient Medications  Medication Sig Dispense Refill  . albuterol (PROAIR HFA) 108 (90 Base) MCG/ACT inhaler Inhale 1-2 puffs into the lungs every 6 (six) hours as needed for wheezing or shortness of breath. 1 Inhaler 3  . atorvastatin (LIPITOR) 40 MG tablet Take 1 tablet (40 mg total) by mouth daily. 90 tablet 1  . benzonatate (TESSALON) 200 MG capsule Take 1 capsule (200 mg total) by mouth 2 (two) times daily as needed for cough. 20 capsule 0  . cefdinir (OMNICEF) 300 MG capsule Take 2 capsules (600 mg total) by mouth daily. 20 capsule 0  . chlorpheniramine (CHLOR-TRIMETON) 4 MG tablet Take 1 tablet (4 mg total) by mouth every 6 (six) hours as needed for rhinitis. 14 tablet 0  . clonazePAM (KLONOPIN) 1 MG tablet Take 1 tablet (1 mg total) by mouth 3 (three) times daily as needed for anxiety. 90 tablet 2  . escitalopram (LEXAPRO) 10 MG tablet Take 3 tablets (30 mg total) by mouth daily. 270 tablet 0  . Fish Oil-Cholecalciferol (FISH OIL + D3 PO) Take by mouth.    . fluticasone (FLONASE) 50 MCG/ACT nasal spray Place 1 spray into both nostrils daily. 16 g 5  . fluticasone (FLOVENT HFA) 110 MCG/ACT inhaler Inhale 2 puffs into the lungs 2 (two) times daily. 1 Inhaler 5  . lithium 600 MG capsule Take 1 capsule  (600 mg total) by mouth 2 (two) times daily with a meal. 180 capsule 0  . meloxicam (MOBIC) 15 MG tablet Take 1 tablet (15 mg total) by mouth daily. 30 tablet 1  . metFORMIN (GLUCOPHAGE) 500 MG tablet     . mirtazapine (REMERON) 30 MG tablet Take 2 tablets (60 mg total) by mouth at bedtime. 180 tablet 0  . omeprazole (PRILOSEC) 40  MG capsule Take 40 mg by mouth daily.    . tamsulosin (FLOMAX) 0.4 MG CAPS capsule Take 1 capsule (0.4 mg total) by mouth daily. 180 capsule 1  . traZODone (DESYREL) 100 MG tablet Take 1.5 tablets (150 mg total) by mouth at bedtime. May also take 1 tablet (100 mg total) at bedtime as needed for sleep (insomnia). 225 tablet 0  . guaiFENesin-codeine 100-10 MG/5ML syrup Take 10 mLs by mouth every 6 (six) hours as needed for cough. THIS WILL CAUSE SEDATION. TAKE AT NIGHT. (Patient not taking: Reported on 01/19/2018) 120 mL 0  . promethazine-codeine (PHENERGAN WITH CODEINE) 6.25-10 MG/5ML syrup Take 5 mLs by mouth at bedtime as needed for cough. (Patient not taking: Reported on 02/09/2018) 120 mL 0   No current facility-administered medications for this visit.      Musculoskeletal: Strength & Muscle Tone: within normal limits Gait & Station: normal Patient leans: N/A  Psychiatric Specialty Exam: Review of Systems  Constitutional: Negative for chills, diaphoresis and fever.  Musculoskeletal: Positive for joint pain. Negative for falls and neck pain.    Blood pressure 131/83, pulse 67, resp. rate 16, weight 172 lb (78 kg).Body mass index is 22.69 kg/m.  General Appearance: Casual  Eye Contact:  Good  Speech:  Clear and Coherent and Normal Rate  Volume:  Normal  Mood:  Anxious and Depressed  Affect:  Congruent  Thought Process:  Goal Directed and Descriptions of Associations: Intact  Orientation:  Full (Time, Place, and Person)  Thought Content:  Logical  Suicidal Thoughts:  No  Homicidal Thoughts:  No  Memory:  Immediate;   Good  Judgement:  Good  Insight:   Good  Psychomotor Activity:  Normal  Concentration:  Concentration: Good  Recall:  Good  Fund of Knowledge:  Good  Language:  Good  Akathisia:  No  Handed:  Right  AIMS (if indicated):     Assets:  Communication Skills Desire for [place] Talents/Skills Transportation  ADL's:  Intact  Cognition:  WNL  Sleep:   poor       Screenings: PHQ2-9     Office Visit from 01/19/2018 in Primary Care at [facility] from 01/09/2018 in Primary Care at [facility] from 11/26/2017 in Primary Care at [facility] from 10/31/2017 in Primary Care at [facility] from 07/12/2017 in Primary Care at [facility] Total Score  0  0  0  0  4  PHQ-9 Total Score  -  -  -  -  17      I reviewed the information below on 02/09/2018 and have updated it Assessment and Plan: PTSD; Bipolar II-depressed; GAD; Skin picking disorder; Insomnia    Medication management with supportive therapy. Risks and benefits, side effects and alternative treatment options discussed with patient. Pt was given an opportunity to ask questions about medication, illness, and treatment. All current psychiatric medications have been reviewed and discussed with the patient and adjusted as clinically appropriate. The patient has been provided an accurate and updated list of the medications being now prescribed. Patient expressed understanding of how their medications were to be used.  Pt verbalized understanding and verbal consent obtained for treatment.  Status of current problems: ongoing  Meds: Lithium 600mg  po BID for Bipolar II Klonopin 1mg  po TID for anxiety Remeron 60mg  po qHS for anxiety and depression Lexapro 30mg  po qD for depression and anxiety- pt is aware the dose is higher than FDA recommended and denies  SE. He is agreeable to dose increase Increase Trazodone 150mg  po qHS for insomnia and 100mg  po qHS prn middle of the night insomnia  Labs: none  Therapy: brief supportive  therapy provided. Discussed psychosocial stressors in detail.   Discussed behavioral techniques to deal with skin picking  Consultations: Encouraged to follow up with therapist Encouraged to follow up with PCP as needed  Pt denies SI and is at an acute low risk for suicide. Patient told to call clinic if any problems occur. Patient advised to go to ER if they should develop SI/HI, side effects, or if symptoms worsen. Has crisis numbers to call if needed. Pt verbalized understanding.  F/up in 2 months or sooner if needed  The duration of this appointment visit was 30 minutes of face-to-face time with the patient.  Greater than 50% of this time was spent in counseling, explanation of  diagnosis, planning of further management, and coordination of care     Charlcie Cradle, MD 02/09/2018, 2:17 PM

## 2018-02-13 DIAGNOSIS — F4310 Post-traumatic stress disorder, unspecified: Secondary | ICD-10-CM | POA: Diagnosis not present

## 2018-02-14 DIAGNOSIS — M9902 Segmental and somatic dysfunction of thoracic region: Secondary | ICD-10-CM | POA: Diagnosis not present

## 2018-02-14 DIAGNOSIS — M9901 Segmental and somatic dysfunction of cervical region: Secondary | ICD-10-CM | POA: Diagnosis not present

## 2018-02-14 DIAGNOSIS — M9903 Segmental and somatic dysfunction of lumbar region: Secondary | ICD-10-CM | POA: Diagnosis not present

## 2018-02-14 DIAGNOSIS — M545 Low back pain: Secondary | ICD-10-CM | POA: Diagnosis not present

## 2018-02-15 ENCOUNTER — Telehealth (HOSPITAL_COMMUNITY): Payer: Self-pay

## 2018-02-15 DIAGNOSIS — R351 Nocturia: Secondary | ICD-10-CM | POA: Diagnosis not present

## 2018-02-15 DIAGNOSIS — N401 Enlarged prostate with lower urinary tract symptoms: Secondary | ICD-10-CM | POA: Diagnosis not present

## 2018-02-15 DIAGNOSIS — N3942 Incontinence without sensory awareness: Secondary | ICD-10-CM | POA: Diagnosis not present

## 2018-02-15 DIAGNOSIS — R3915 Urgency of urination: Secondary | ICD-10-CM | POA: Diagnosis not present

## 2018-02-15 MED FILL — ATORVASTATIN 40 MG TABLET: 40 | 90 days supply | Qty: 90 | Fill #1

## 2018-02-15 MED FILL — TOVIAZ ER 8 MG TABLET: 8 | 30 days supply | Qty: 30 | Fill #0

## 2018-02-15 NOTE — Telephone Encounter (Signed)
Medication management - Prior authorization for patient's Escitalopram 10 mg, 3 a day, #270 for a 90 day supply completed online with CoverMyMeds and decision pending.

## 2018-02-16 ENCOUNTER — Ambulatory Visit (INDEPENDENT_AMBULATORY_CARE_PROVIDER_SITE_OTHER): Payer: Medicare PPO

## 2018-02-16 ENCOUNTER — Telehealth (HOSPITAL_COMMUNITY): Payer: Self-pay

## 2018-02-16 ENCOUNTER — Ambulatory Visit (INDEPENDENT_AMBULATORY_CARE_PROVIDER_SITE_OTHER): Payer: Medicare PPO | Admitting: Podiatry

## 2018-02-16 ENCOUNTER — Encounter: Payer: Self-pay | Admitting: Podiatry

## 2018-02-16 DIAGNOSIS — M722 Plantar fascial fibromatosis: Secondary | ICD-10-CM | POA: Diagnosis not present

## 2018-02-16 DIAGNOSIS — F4310 Post-traumatic stress disorder, unspecified: Secondary | ICD-10-CM | POA: Diagnosis not present

## 2018-02-16 MED FILL — ESCITALOPRAM 10 MG TABLET: 10 | 90 days supply | Qty: 270 | Fill #0

## 2018-02-16 NOTE — Telephone Encounter (Signed)
Medication management - Prior authorization for patient's continuation on Mirtazapine 30 mg, 2 a day, #180 for 90 days submitted online with covermymeds.  Decision pending.

## 2018-02-17 NOTE — Progress Notes (Signed)
Subjective:   Patient ID: Matthew Ortiz, male   DOB: 57 y.o.   MRN: [id number]   HPI Patient states is doing well with his procedure but is not sure if maybe his arch has gotten somewhat flatter but states is not having much pain and is wearing tennis shoes   ROS      Objective:  Physical Exam  Neurovascular status intact with patient found to have a nodule that has been removed from the plantar arch left with excellent healing of the incision with no current thickness of it for pain with palpation     Assessment:  Doing well post fibroma removal left     Plan:  Reviewed condition and x-ray and at this point I would allow him to return to normal shoes wear supportive inserts and patient will be seen back on an as-needed basis.  Hopefully will not have any regrowth for future fibroma formation  X-ray indicates that the arch height has remained satisfactory and I did not see a difference from the previous views

## 2018-02-20 DIAGNOSIS — F4310 Post-traumatic stress disorder, unspecified: Secondary | ICD-10-CM | POA: Diagnosis not present

## 2018-02-23 DIAGNOSIS — F4310 Post-traumatic stress disorder, unspecified: Secondary | ICD-10-CM | POA: Diagnosis not present

## 2018-02-27 DIAGNOSIS — F4310 Post-traumatic stress disorder, unspecified: Secondary | ICD-10-CM | POA: Diagnosis not present

## 2018-03-02 DIAGNOSIS — F4310 Post-traumatic stress disorder, unspecified: Secondary | ICD-10-CM | POA: Diagnosis not present

## 2018-03-06 DIAGNOSIS — F4310 Post-traumatic stress disorder, unspecified: Secondary | ICD-10-CM | POA: Diagnosis not present

## 2018-03-13 DIAGNOSIS — F4310 Post-traumatic stress disorder, unspecified: Secondary | ICD-10-CM | POA: Diagnosis not present

## 2018-03-16 DIAGNOSIS — F4310 Post-traumatic stress disorder, unspecified: Secondary | ICD-10-CM | POA: Diagnosis not present

## 2018-03-21 DIAGNOSIS — F4310 Post-traumatic stress disorder, unspecified: Secondary | ICD-10-CM | POA: Diagnosis not present

## 2018-03-23 DIAGNOSIS — F4310 Post-traumatic stress disorder, unspecified: Secondary | ICD-10-CM | POA: Diagnosis not present

## 2018-03-27 DIAGNOSIS — F4310 Post-traumatic stress disorder, unspecified: Secondary | ICD-10-CM | POA: Diagnosis not present

## 2018-03-31 DIAGNOSIS — F4310 Post-traumatic stress disorder, unspecified: Secondary | ICD-10-CM | POA: Diagnosis not present

## 2018-04-03 DIAGNOSIS — F4310 Post-traumatic stress disorder, unspecified: Secondary | ICD-10-CM | POA: Diagnosis not present

## 2018-04-06 DIAGNOSIS — F4310 Post-traumatic stress disorder, unspecified: Secondary | ICD-10-CM | POA: Diagnosis not present

## 2018-04-10 DIAGNOSIS — F4310 Post-traumatic stress disorder, unspecified: Secondary | ICD-10-CM | POA: Diagnosis not present

## 2018-04-13 DIAGNOSIS — R351 Nocturia: Secondary | ICD-10-CM | POA: Diagnosis not present

## 2018-04-13 DIAGNOSIS — N401 Enlarged prostate with lower urinary tract symptoms: Secondary | ICD-10-CM | POA: Diagnosis not present

## 2018-04-13 DIAGNOSIS — F4310 Post-traumatic stress disorder, unspecified: Secondary | ICD-10-CM | POA: Diagnosis not present

## 2018-04-13 DIAGNOSIS — N3944 Nocturnal enuresis: Secondary | ICD-10-CM | POA: Diagnosis not present

## 2018-04-15 ENCOUNTER — Ambulatory Visit (HOSPITAL_COMMUNITY)
Admission: EM | Admit: 2018-04-15 | Discharge: 2018-04-15 | Disposition: A | Discharge status: Home / Self Care | Payer: Medicare PPO | Attending: Emergency Medicine | Admitting: Emergency Medicine

## 2018-04-15 ENCOUNTER — Encounter (HOSPITAL_COMMUNITY): Payer: Self-pay | Admitting: Emergency Medicine

## 2018-04-15 DIAGNOSIS — M79674 Pain in right toe(s): Secondary | ICD-10-CM | POA: Insufficient documentation

## 2018-04-15 MED ORDER — INDOMETHACIN 25 MG PO CAPS: 25.0000 mg | ORAL_CAPSULE | Freq: Three times a day (TID) | ORAL | 0 refills | Status: AC | PRN

## 2018-04-15 MED ORDER — PREDNISONE 10 MG (21) PO TBPK: ORAL_TABLET | Freq: Every day | ORAL | 0 refills | Status: DC

## 2018-04-15 NOTE — Discharge Instructions (Signed)
Please begin prednisone taper-begin with 6 tablets today, decreased by 1 tablet until you take 1 tablet on day 6, I recommend you taking with food and in the morning if you are able  You may supplement with either Tylenol and ibuprofen or indomethacin.  Please use this sparingly is using this in combination with prednisone and Lexapro will increase the risk of GI bleed.  May ice and elevate toe  Please follow-up if symptoms not resolving with treatment for gout, developing increased redness, swelling, pain, numbness or tingling

## 2018-04-15 NOTE — ED Triage Notes (Signed)
Pt sts right great toe pain that he thinks is gout

## 2018-04-16 NOTE — ED Provider Notes (Signed)
[facility]    CSN: [id number] Arrival date & time: 04/15/18  1041     History   Chief Complaint Chief Complaint  Patient presents with  . Foot Pain    HPI Matthew Ortiz. is a 57 y.o. male history of DM type II, COPD, hypertension, GERD, IBS presenting today for evaluation of right great toe pain.  Patient states that overnight he developed a throbbing pain at the base of his toe.  He is also noticed redness and swelling to this area.  He states that he has a history of gout and believes his symptoms feel similar.  He denies any injury over the past few days or fall.  He has not taken anything for symptoms.  Denies numbness or tingling.  Notes that his diabetes is under control, only on metformin.  HPI  Past Medical History:  Diagnosis Date  . Anxiety   . Asthma   . COPD (chronic obstructive pulmonary disease) ([place])   . Depression   . Diabetes mellitus, type II ([place])   . GERD (gastroesophageal reflux disease)   . HTN (hypertension)   . IBS (irritable bowel syndrome)   . Palpitations     Patient Active Problem List   Diagnosis Date Noted  . Cough 01/09/2018  . Lower resp. tract infection 01/09/2018  . Sinus congestion 01/09/2018  . Arthritis 12/22/2017  . Infertility male 12/22/2017  . Asthma 10/11/2017  . Chronic rhinitis 10/11/2017  . Psychophysiological insomnia 08/30/2017  . Major depressive disorder in partial remission ([place]) 08/30/2017  . Anxiety and depression 08/30/2017  . Chronically on benzodiazepine therapy 08/30/2017  . Hepatic steatosis 07/12/2017  . Vitamin D deficiency 07/12/2017  . Idiopathic hematuria 07/12/2017  . Essential hypertension 07/12/2017  . Hyperlipidemia LDL goal <100 07/12/2017  . Type 2 diabetes mellitus with complication, without long-term current use of insulin ([place]) 07/12/2017  . Diverticulitis of large intestine 01/29/2014  . PTSD (post-traumatic stress disorder) 01/22/2014  . Insomnia related to another mental  disorder 01/22/2014  . GAD (generalized anxiety disorder) 01/22/2014  . Skin-picking disorder 01/22/2014  . Bipolar 2 disorder, major depressive episode ([place]) 01/22/2014  . Benign prostatic hyperplasia 05/03/2011  . Hemorrhoids, internal 05/03/2011  . Male erectile disorder 05/03/2011  . Bipolar affective ([place]) 04/02/2011  . Diverticulitis 04/02/2011  . GERD (gastroesophageal reflux disease) 04/02/2011  . IBS (irritable bowel syndrome) 04/02/2011  . Osteoarthritis of shoulder 04/02/2011  . Snoring 04/02/2011  . Tachycardia 04/02/2011    Past Surgical History:  Procedure Laterality Date  . APPENDECTOMY    . FUNDOPLASTY TRANSTHORACIC    . HERNIA REPAIR    . RECONSTRUCTION OF NOSE         Home Medications    Prior to Admission medications   Medication Sig Start Date End Date Taking? Authorizing Provider  albuterol (PROAIR HFA) 108 (90 Base) MCG/ACT inhaler Inhale 1-2 puffs into the lungs every 6 (six) hours as needed for wheezing or shortness of breath. 08/11/17   Shawnee Knapp, MD  atorvastatin (LIPITOR) 40 MG tablet Take 1 tablet (40 mg total) by mouth daily. 10/31/17   Shawnee Knapp, MD  clonazePAM (KLONOPIN) 1 MG tablet Take 1 tablet (1 mg total) by mouth 3 (three) times daily as needed for anxiety. 02/09/18   Charlcie Cradle, MD  escitalopram (LEXAPRO) 10 MG tablet Take 3 tablets (30 mg total) by mouth daily. 02/09/18 02/09/19  Charlcie Cradle, MD  Fish Oil-Cholecalciferol (FISH OIL + D3 PO) Take by mouth.  [provider]  fluticasone (FLONASE) 50 MCG/ACT nasal spray Place 1 spray into both nostrils daily. 08/11/17   Shawnee Knapp, MD  fluticasone (FLOVENT HFA) 110 MCG/ACT inhaler Inhale 2 puffs into the lungs 2 (two) times daily. 10/11/17   Juanito Doom, MD  indomethacin (INDOCIN) 25 MG capsule Take 1 capsule (25 mg total) by mouth 3 (three) times daily as needed. 04/15/18   Wieters, Hallie C, PA-C  lithium 600 MG capsule Take 1 capsule (600 mg total) by mouth 2 (two)  times daily with a meal. 02/09/18   Charlcie Cradle, MD  meloxicam (MOBIC) 15 MG tablet Take 1 tablet (15 mg total) by mouth daily. 10/31/17   Shawnee Knapp, MD  metFORMIN (GLUCOPHAGE) 500 MG tablet  09/22/17   [provider]  mirtazapine (REMERON) 30 MG tablet Take 2 tablets (60 mg total) by mouth at bedtime. 02/09/18 02/09/19  Charlcie Cradle, MD  omeprazole (PRILOSEC) 40 MG capsule Take 40 mg by mouth daily.    [provider]  predniSONE (STERAPRED UNI-PAK 21 TAB) 10 MG (21) TBPK tablet Take by mouth daily. Begin with 6 tablets today, 5 tablets on day 2, 4 on day 3, 3 on day 4, 2 on day 5, 1 on day 6 04/15/18   Wieters, [employer] C, PA-C  promethazine-codeine (PHENERGAN WITH CODEINE) 6.25-10 MG/5ML syrup Take 5 mLs by mouth at bedtime as needed for cough. 01/09/18   Horald Pollen, MD  tamsulosin (FLOMAX) 0.4 MG CAPS capsule Take 1 capsule (0.4 mg total) by mouth daily. 08/11/17   Shawnee Knapp, MD  traZODone (DESYREL) 100 MG tablet Take 1.5 tablets (150 mg total) by mouth at bedtime. May also take 1 tablet (100 mg total) at bedtime as needed for sleep (insomnia). 02/09/18   Charlcie Cradle, MD    Family History Family History  Problem Relation Age of Onset  . Depression Mother   . Anxiety disorder Mother   . Schizophrenia Father   . Suicidality Father   . Suicidality Paternal Grandfather   . Drug abuse Paternal Grandfather   . Alcohol abuse Paternal Grandfather     Social History Social History   Tobacco Use  . Smoking status: Former Smoker    Packs/day: 1.50    Years: 20.00    Pack years: 30.00    Last attempt to quit: 05/03/1998    Years since quitting: 19.9  . Smokeless tobacco: Never Used  Substance Use Topics  . Alcohol use: No    Comment: socially-small amount of wine monthly - 1 glass   . Drug use: No    Types: Marijuana    Comment: last times was over 1 yr ago     Allergies   Bee pollen; Milk-related compounds; and Penicillins   Review of  Systems Review of Systems  Constitutional: Negative for fatigue and fever.  Eyes: Negative for redness, itching and visual disturbance.  Respiratory: Negative for shortness of breath.   Cardiovascular: Negative for chest pain and leg swelling.  Gastrointestinal: Negative for nausea and vomiting.  Musculoskeletal: Positive for arthralgias, gait problem and joint swelling. Negative for myalgias.  Skin: Positive for color change. Negative for rash and wound.  Neurological: Negative for dizziness, syncope, weakness, light-headedness and headaches.     Physical Exam Triage Vital Signs ED Triage Vitals [04/15/18 1142]  Enc Vitals Group     BP 125/87     Pulse Rate 72     Resp 18     Temp  97.8 F (36.6 C)     Temp Source Oral     SpO2 99 %     Weight      Height      Head Circumference      Peak Flow      Pain Score 8     Pain Loc      Pain Edu?      Excl. in [place]?    No data found.  Updated Vital Signs BP 125/87 (BP Location: Right Arm)   Pulse 72   Temp 97.8 F (36.6 C) (Oral)   Resp 18   SpO2 99%   Visual Acuity Right Eye Distance:   Left Eye Distance:   Bilateral Distance:    Right Eye Near:   Left Eye Near:    Bilateral Near:     Physical Exam Vitals signs and nursing note reviewed.  Constitutional:      Appearance: He is well-developed.     Comments: No acute distress  HENT:     Head: Normocephalic and atraumatic.     Nose: Nose normal.  Eyes:     Conjunctiva/sclera: Conjunctivae normal.  Neck:     Musculoskeletal: Neck supple.  Cardiovascular:     Rate and Rhythm: Normal rate.  Pulmonary:     Effort: Pulmonary effort is normal. No respiratory distress.  Abdominal:     General: There is no distension.  Musculoskeletal: Normal range of motion.     Comments: Swelling, warmth, and erythema to base of great toe, significant tenderness to touch, erythema does not extend outward; able to wiggle toes, dorsalis pedis 2+, nontender throughout metatarsals   Skin:    General: Skin is warm and dry.  Neurological:     Mental Status: He is alert and oriented to person, place, and time.      UC Treatments / Results  Labs (all labs ordered are listed, but only abnormal results are displayed) Labs Reviewed - No data to display  EKG None  Radiology No results found.  Procedures Procedures (including critical care time)  Medications Ordered in UC Medications - No data to display  Initial Impression / Assessment and Plan / UC Course  I have reviewed the triage vital signs and the nursing notes.  Pertinent labs & imaging results that were available during my care of the patient were reviewed by me and considered in my medical decision making (see chart for details).     Patient with right great toe pain, lacking mechanism of injury.  Given history of gout, exam and location, will treat as such with prednisone.  Advised that he may use NSAIDs sparingly with this, advised that this will increase the risk of GI bleed.  Continue to monitor redness, if spreading may could be concern for cellulitis.Discussed strict return precautions. Patient verbalized understanding and is agreeable with plan.  Final Clinical Impressions(s) / UC Diagnoses   Final diagnoses:  Great toe pain, right     Discharge Instructions     Please begin prednisone taper-begin with 6 tablets today, decreased by 1 tablet until you take 1 tablet on day 6, I recommend you taking with food and in the morning if you are able  You may supplement with either Tylenol and ibuprofen or indomethacin.  Please use this sparingly is using this in combination with prednisone and Lexapro will increase the risk of GI bleed.  May ice and elevate toe  Please follow-up if symptoms not resolving with treatment for gout, developing  increased redness, swelling, pain, numbness or tingling   ED Prescriptions    Medication Sig Dispense Auth. Provider   predniSONE (STERAPRED UNI-PAK 21  TAB) 10 MG (21) TBPK tablet Take by mouth daily. Begin with 6 tablets today, 5 tablets on day 2, 4 on day 3, 3 on day 4, 2 on day 5, 1 on day 6 21 tablet Wieters, Hallie C, PA-C   indomethacin (INDOCIN) 25 MG capsule Take 1 capsule (25 mg total) by mouth 3 (three) times daily as needed. 30 capsule Wieters, Hallie C, PA-C     Controlled Substance Prescriptions [place] Controlled Substance Registry consulted? Not Applicable   Janith L[place], [place] 04/16/18 1216

## 2018-04-18 DIAGNOSIS — F4310 Post-traumatic stress disorder, unspecified: Secondary | ICD-10-CM | POA: Diagnosis not present

## 2018-04-20 ENCOUNTER — Encounter (HOSPITAL_COMMUNITY): Payer: Self-pay | Admitting: Psychiatry

## 2018-04-20 ENCOUNTER — Ambulatory Visit (HOSPITAL_COMMUNITY): Payer: Medicare PPO | Admitting: Psychiatry

## 2018-04-20 DIAGNOSIS — F4310 Post-traumatic stress disorder, unspecified: Secondary | ICD-10-CM

## 2018-04-20 DIAGNOSIS — F5105 Insomnia due to other mental disorder: Secondary | ICD-10-CM | POA: Diagnosis not present

## 2018-04-20 DIAGNOSIS — F411 Generalized anxiety disorder: Secondary | ICD-10-CM | POA: Diagnosis not present

## 2018-04-20 DIAGNOSIS — F3181 Bipolar II disorder: Secondary | ICD-10-CM

## 2018-04-20 MED ORDER — ESCITALOPRAM OXALATE 10 MG PO TABS: 30.0000 mg | ORAL_TABLET | Freq: Every day | ORAL | 0 refills | Status: DC

## 2018-04-20 MED ORDER — TRAZODONE HCL 100 MG PO TABS: ORAL_TABLET | ORAL | 0 refills | Status: DC

## 2018-04-20 MED ORDER — MIRTAZAPINE 30 MG PO TABS: 60.0000 mg | ORAL_TABLET | Freq: Every day | ORAL | 0 refills | Status: DC

## 2018-04-20 MED ORDER — LITHIUM CARBONATE 600 MG PO CAPS: 600.0000 mg | ORAL_CAPSULE | Freq: Two times a day (BID) | ORAL | 0 refills | Status: DC

## 2018-04-20 MED ORDER — CLONAZEPAM 1 MG PO TABS: 1.0000 mg | ORAL_TABLET | Freq: Three times a day (TID) | ORAL | 2 refills | Status: DC | PRN

## 2018-04-20 NOTE — Progress Notes (Signed)
BH MD/PA/NP OP Progress Note  04/20/2018 1:55 PM Matthew Ortiz.  MRN:  [id number]  Chief Complaint:  Chief Complaint    Post-Traumatic Stress Disorder; Depression; Follow-up     HPI: Patient tells me that his wife has been having some medical problems.  As a result she has been more directive and controlling of the patient's behavior.  He is resentful and frustrated as she is not happy with his care of her or his personal needs including going out and getting rest.  He states that his wife is manipulative and enjoys the attention she gets from being sick.  He is having PTSD with worsening triggers due to issues with his wife.  He is getting about 5 hours of sleep with the trazodone.  He is taking his Klonopin Lexapro and Remeron as prescribed today he denies SI/HI.  Denies manic and hypomanic-like symptoms.   Visit Diagnosis:    ICD-10-CM   1. PTSD (post-traumatic stress disorder) F43.10 clonazePAM (KLONOPIN) 1 MG tablet    escitalopram (LEXAPRO) 10 MG tablet    mirtazapine (REMERON) 30 MG tablet  2. Insomnia related to another mental disorder F51.05 clonazePAM (KLONOPIN) 1 MG tablet    mirtazapine (REMERON) 30 MG tablet    traZODone (DESYREL) 100 MG tablet  3. GAD (generalized anxiety disorder) F41.1 escitalopram (LEXAPRO) 10 MG tablet    mirtazapine (REMERON) 30 MG tablet  4. Bipolar II disorder (HCC) F31.81 lithium 600 MG capsule      Past Psychiatric History:  Anxiety: Yes Bipolar Disorder: Yes Depression: Yes Mania: Yes Psychosis: No Schizophrenia: No Personality Disorder: No Hospitalization for psychiatric illness: Yes History of Electroconvulsive Shock Therapy: No Prior Suicide Attempts: Yes Previous meds: Seroquel, Abilify, Elavil, Wellbutrin, Ambien, Paxil, Effexor, Buspar   Past Medical History:  Past Medical History:  Diagnosis Date  . Anxiety   . Asthma   . COPD (chronic obstructive pulmonary disease) ([place])   . Depression   . Diabetes mellitus, type II  ([place])   . GERD (gastroesophageal reflux disease)   . Gout   . HTN (hypertension)   . IBS (irritable bowel syndrome)   . Palpitations     Past Surgical History:  Procedure Laterality Date  . APPENDECTOMY    . FUNDOPLASTY TRANSTHORACIC    . HERNIA REPAIR    . RECONSTRUCTION OF NOSE      Family Psychiatric History:  Family History  Problem Relation Age of Onset  . Depression Mother   . Anxiety disorder Mother   . Schizophrenia Father   . Suicidality Father   . Suicidality Paternal Grandfather   . Drug abuse Paternal Grandfather   . Alcohol abuse Paternal Grandfather     Social History:  Social History   Socioeconomic History  . Marital status: Married    Spouse name: Not on file  . Number of children: Not on file  . Years of education: Not on file  . Highest education level: Not on file  Occupational History  . Not on file  Social Needs  . Financial resource strain: Not on file  . Food insecurity:    Worry: Not on file    Inability: Not on file  . Transportation needs:    Medical: Not on file    Non-medical: Not on file  Tobacco Use  . Smoking status: Former Smoker    Packs/day: 1.50    Years: 20.00    Pack years: 30.00    Last attempt to quit: 05/03/1998    Years  since quitting: 19.9  . Smokeless tobacco: Never Used  Substance and Sexual Activity  . Alcohol use: No    Comment: socially-small amount of wine monthly - 1 glass   . Drug use: No    Types: Marijuana    Comment: last times was over 1 yr ago  . Sexual activity: Never  Lifestyle  . Physical activity:    Days per week: Not on file    Minutes per session: Not on file  . Stress: Not on file  Relationships  . Social connections:    Talks on phone: Not on file    Gets together: Not on file    Attends religious service: Not on file    Active member of club or organization: Not on file    Attends meetings of clubs or organizations: Not on file    Relationship status: Not on file  Other Topics  Concern  . Not on file  Social History Narrative  . Not on file    Allergies:  Allergies  Allergen Reactions  . Bee Pollen   . Milk-Related Compounds Diarrhea  . Penicillins Rash    Metabolic Disorder Labs: Lab Results  Component Value Date   HGBA1C 5.5 10/31/2017   No results found for: PROLACTIN Lab Results  Component Value Date   CHOL 146 11/04/2017   TRIG 267 (H) 11/04/2017   HDL 38 (L) 11/04/2017   CHOLHDL 3.8 11/04/2017   LDLCALC 55 11/04/2017   Lab Results  Component Value Date   TSH 2.490 07/12/2017    Therapeutic Level Labs: No results found for: LITHIUM No results found for: VALPROATE No components found for:  CBMZ  Current Medications: Current Outpatient Medications  Medication Sig Dispense Refill  . albuterol (PROAIR HFA) 108 (90 Base) MCG/ACT inhaler Inhale 1-2 puffs into the lungs every 6 (six) hours as needed for wheezing or shortness of breath. 1 Inhaler 3  . atorvastatin (LIPITOR) 40 MG tablet Take 1 tablet (40 mg total) by mouth daily. 90 tablet 1  . clonazePAM (KLONOPIN) 1 MG tablet Take 1 tablet (1 mg total) by mouth 3 (three) times daily as needed for anxiety. 90 tablet 2  . escitalopram (LEXAPRO) 10 MG tablet Take 3 tablets (30 mg total) by mouth daily. 270 tablet 0  . Fish Oil-Cholecalciferol (FISH OIL + D3 PO) Take by mouth.    . fluticasone (FLONASE) 50 MCG/ACT nasal spray Place 1 spray into both nostrils daily. 16 g 5  . fluticasone (FLOVENT HFA) 110 MCG/ACT inhaler Inhale 2 puffs into the lungs 2 (two) times daily. 1 Inhaler 5  . indomethacin (INDOCIN) 25 MG capsule Take 1 capsule (25 mg total) by mouth 3 (three) times daily as needed. 30 capsule 0  . lithium 600 MG capsule Take 1 capsule (600 mg total) by mouth 2 (two) times daily with a meal. 180 capsule 0  . meloxicam (MOBIC) 15 MG tablet Take 1 tablet (15 mg total) by mouth daily. 30 tablet 1  . metFORMIN (GLUCOPHAGE) 500 MG tablet     . mirtazapine (REMERON) 30 MG tablet Take 2  tablets (60 mg total) by mouth at bedtime. 180 tablet 0  . omeprazole (PRILOSEC) 40 MG capsule Take 40 mg by mouth daily.    . predniSONE (STERAPRED UNI-PAK 21 TAB) 10 MG (21) TBPK tablet Take by mouth daily. Begin with 6 tablets today, 5 tablets on day 2, 4 on day 3, 3 on day 4, 2 on day 5, 1 on day 6  21 tablet 0  . tamsulosin (FLOMAX) 0.4 MG CAPS capsule Take 1 capsule (0.4 mg total) by mouth daily. 180 capsule 1  . traZODone (DESYREL) 100 MG tablet Take 1.5 tablets (150 mg total) by mouth at bedtime. May also take 1 tablet (100 mg total) at bedtime as needed for sleep (insomnia). 225 tablet 0  . promethazine-codeine (PHENERGAN WITH CODEINE) 6.25-10 MG/5ML syrup Take 5 mLs by mouth at bedtime as needed for cough. (Patient not taking: Reported on 04/20/2018) 120 mL 0   No current facility-administered medications for this visit.      Musculoskeletal: Strength & Muscle Tone: within normal limits Gait & Station: normal Patient leans: N/A  Psychiatric Specialty Exam: Review of Systems  Constitutional: Negative for chills, diaphoresis and fever.  Musculoskeletal: Positive for joint pain. Negative for back pain and falls.    Blood pressure 131/88, pulse 70, height 6\' 1"  (1.854 m), weight 174 lb 6.4 oz (79.1 kg), SpO2 98 %.Body mass index is 23.01 kg/m.  General Appearance: Fairly Groomed  Eye Contact:  Good  Speech:  Clear and Coherent and Normal Rate  Volume:  Normal  Mood:  Anxious and Depressed  Affect:  Congruent  Thought Process:  Coherent and Descriptions of Associations: Circumstantial  Orientation:  Full (Time, Place, and Person)  Thought Content:  Logical  Suicidal Thoughts:  No  Homicidal Thoughts:  No  Memory:  Immediate;   Good  Judgement:  Good  Insight:  Good  Psychomotor Activity:  Normal  Concentration:  Concentration: Good  Recall:  Good  Fund of Knowledge:  Good  Language:  Good  Akathisia:  No  Handed:  Right  AIMS (if indicated):     Assets:  Communication  Skills Desire for Improvement Financial Resources/Insurance Housing Resilience Talents/Skills Transportation Vocational/Educational  ADL's:  Intact  Cognition:  WNL  Sleep:   good         Screenings: PHQ2-9     Office Visit from 01/19/2018 in Primary Care at [place] from 01/09/2018 in Primary Care at [place] from 11/26/2017 in Primary Care at [place] from 10/31/2017 in Primary Care at [place] from 07/12/2017 in [place] at [facility] Total Score  0  0  0  0  4  PHQ-9 Total Score  -  -  -  -  17     I reviewed the information below on 04/20/2018 and updated it Assessment and Plan: PTSD; Bipolar II-depressed; GAD; Skin picking disorder; Insomnia    Medication management with supportive therapy. Risks and benefits, side effects and alternative treatment options discussed with patient. Pt was given an opportunity to ask questions about medication, illness, and treatment. All current psychiatric medications have been reviewed and discussed with the patient and adjusted as clinically appropriate. The patient has been provided an accurate and updated list of the medications being now prescribed. Patient expressed understanding of how their medications were to be used.  Pt verbalized understanding and verbal consent obtained for treatment.  Status of current problems: ongoing  Meds: Lithium 600mg  po BID for Bipolar II Klonopin 1mg  po TID for anxiety Remeron 60mg  po qHS for anxiety and depression Lexapro 30mg  po qD for depression and anxiety- pt is aware the dose is higher than FDA recommended and denies SE. He is agreeable to dose  Trazodone 150mg  po qHS for insomnia and 100mg  po qHS prn middle of the night insomnia Pt does not want meds changed today  Labs:  none  Therapy: brief supportive therapy provided. Discussed psychosocial stressors in detail.   Discussed behavioral techniques to deal with skin picking We discussed how his  wife's current treatment towards him is triggering his PTSD as related to behaviors he experienced with his trauma.  Consultations: Encouraged to follow up with therapist Encouraged to follow up with PCP as needed  Pt denies SI and is at an acute low risk for suicide. Patient told to call clinic if any problems occur. Patient advised to go to ER if they should develop SI/HI, side effects, or if symptoms worsen. Has crisis numbers to call if needed. Pt verbalized understanding.  F/up in 2 months or sooner if needed  The duration of this appointment visit was 25 minutes of face-to-face time with the patient.  Greater than 50% of this time was spent in counseling, explanation of  diagnosis, planning of further management, and coordination of care   Charlcie Cradle, MD 04/20/2018, 1:55 PM

## 2018-04-24 DIAGNOSIS — F4310 Post-traumatic stress disorder, unspecified: Secondary | ICD-10-CM | POA: Diagnosis not present

## 2018-04-27 DIAGNOSIS — F4310 Post-traumatic stress disorder, unspecified: Secondary | ICD-10-CM | POA: Diagnosis not present

## 2018-05-01 DIAGNOSIS — F4310 Post-traumatic stress disorder, unspecified: Secondary | ICD-10-CM | POA: Diagnosis not present

## 2018-05-08 DIAGNOSIS — F4310 Post-traumatic stress disorder, unspecified: Secondary | ICD-10-CM | POA: Diagnosis not present

## 2018-05-12 ENCOUNTER — Telehealth (HOSPITAL_COMMUNITY): Payer: Self-pay

## 2018-05-12 ENCOUNTER — Ambulatory Visit (INDEPENDENT_AMBULATORY_CARE_PROVIDER_SITE_OTHER): Payer: Medicare PPO

## 2018-05-12 ENCOUNTER — Encounter (HOSPITAL_COMMUNITY): Payer: Self-pay

## 2018-05-12 ENCOUNTER — Ambulatory Visit (HOSPITAL_COMMUNITY)
Admission: EM | Admit: 2018-05-12 | Discharge: 2018-05-12 | Disposition: A | Discharge status: Home / Self Care | Payer: Medicare PPO | Attending: Internal Medicine | Admitting: Internal Medicine

## 2018-05-12 DIAGNOSIS — R05 Cough: Secondary | ICD-10-CM | POA: Diagnosis not present

## 2018-05-12 DIAGNOSIS — R0789 Other chest pain: Secondary | ICD-10-CM

## 2018-05-12 DIAGNOSIS — H6691 Otitis media, unspecified, right ear: Secondary | ICD-10-CM | POA: Insufficient documentation

## 2018-05-12 DIAGNOSIS — J069 Acute upper respiratory infection, unspecified: Secondary | ICD-10-CM | POA: Insufficient documentation

## 2018-05-12 MED ORDER — AZITHROMYCIN 250 MG PO TABS: 250.0000 mg | ORAL_TABLET | Freq: Every day | ORAL | 0 refills | Status: DC

## 2018-05-12 MED ORDER — BENZONATATE 200 MG PO CAPS: 200.0000 mg | ORAL_CAPSULE | Freq: Three times a day (TID) | ORAL | 0 refills | Status: DC | PRN

## 2018-05-12 NOTE — ED Triage Notes (Signed)
Pt presents with upper respiratory symptoms; nasal drainage, congestion, fever, cough with no mucus, and left side rib pain.

## 2018-05-12 NOTE — Discharge Instructions (Signed)
Symptoms today seem most consistent with right ear infection and upper respiratory infection.  Chest xray did not demonstrate any pneumonia.  Push fluids and rest.  Prescriptions for zithromax (antibiotic) and benzonatate (for cough) were sent to the pharmacy.  Anticipate gradual improvement in well being, cough, over the next several days.  Cough may take a couple weeks to subside.  Recheck for persistent (>3 more days) fever >100.5, increasing phlegm production/nasal discharge, or if not starting to improve in a few days.

## 2018-05-12 NOTE — ED Triage Notes (Signed)
Pt also complain of ear pain in both ears.

## 2018-05-12 NOTE — ED Provider Notes (Signed)
[facility]    CSN: [id number] Arrival date & time: 05/12/18  2993     History   Chief Complaint Chief Complaint  Patient presents with  . URI    HPI Matthew Ortiz. is a 58 y.o. male.   He presents today with cough with scant production, runny/congested nose, ear discomfort and throat discomfort.  The symptoms started 4 to 5 days ago, and then he had a little bit of fever and some discomfort in his left lower anterior chest with coughing last night.  He has had a flu shot.  Does not feel like this is the flu.    HPI  Past Medical History:  Diagnosis Date  . Anxiety   . Asthma   . COPD (chronic obstructive pulmonary disease) ([place])   . Depression   . Diabetes mellitus, type II ([place])   . GERD (gastroesophageal reflux disease)   . Gout   . HTN (hypertension)   . IBS (irritable bowel syndrome)   . Palpitations     Patient Active Problem List   Diagnosis Date Noted  . Cough 01/09/2018  . Lower resp. tract infection 01/09/2018  . Sinus congestion 01/09/2018  . Arthritis 12/22/2017  . Infertility male 12/22/2017  . Asthma 10/11/2017  . Chronic rhinitis 10/11/2017  . Psychophysiological insomnia 08/30/2017  . Major depressive disorder in partial remission ([place]) 08/30/2017  . Anxiety and depression 08/30/2017  . Chronically on benzodiazepine therapy 08/30/2017  . Hepatic steatosis 07/12/2017  . Vitamin D deficiency 07/12/2017  . Idiopathic hematuria 07/12/2017  . Essential hypertension 07/12/2017  . Hyperlipidemia LDL goal <100 07/12/2017  . Type 2 diabetes mellitus with complication, without long-term current use of insulin ([place]) 07/12/2017  . Diverticulitis of large intestine 01/29/2014  . PTSD (post-traumatic stress disorder) 01/22/2014  . Insomnia related to another mental disorder 01/22/2014  . GAD (generalized anxiety disorder) 01/22/2014  . Skin-picking disorder 01/22/2014  . Bipolar 2 disorder, major depressive episode ([place]) 01/22/2014  .  Benign prostatic hyperplasia 05/03/2011  . Hemorrhoids, internal 05/03/2011  . Male erectile disorder 05/03/2011  . Bipolar affective ([place]) 04/02/2011  . Diverticulitis 04/02/2011  . GERD (gastroesophageal reflux disease) 04/02/2011  . IBS (irritable bowel syndrome) 04/02/2011  . Osteoarthritis of shoulder 04/02/2011  . Snoring 04/02/2011  . Tachycardia 04/02/2011    Past Surgical History:  Procedure Laterality Date  . APPENDECTOMY    . FUNDOPLASTY TRANSTHORACIC    . HERNIA REPAIR    . RECONSTRUCTION OF NOSE         Home Medications    Prior to Admission medications   Medication Sig Start Date End Date Taking? Authorizing Provider  albuterol (PROAIR HFA) 108 (90 Base) MCG/ACT inhaler Inhale 1-2 puffs into the lungs every 6 (six) hours as needed for wheezing or shortness of breath. 08/11/17   Shawnee Knapp, MD  atorvastatin (LIPITOR) 40 MG tablet Take 1 tablet (40 mg total) by mouth daily. 10/31/17   Shawnee Knapp, MD  azithromycin (ZITHROMAX) 250 MG tablet Take 1 tablet (250 mg total) by mouth daily. Take first 2 tablets together, then 1 every day until finished. 05/12/18   Wynona Luna, MD  benzonatate (TESSALON) 200 MG capsule Take 1 capsule (200 mg total) by mouth 3 (three) times daily as needed for cough. 05/12/18   Wynona Luna, MD  clonazePAM (KLONOPIN) 1 MG tablet Take 1 tablet (1 mg total) by mouth 3 (three) times daily as needed for anxiety. 04/20/18   Charlcie Cradle,  MD  escitalopram (LEXAPRO) 10 MG tablet Take 3 tablets (30 mg total) by mouth daily. 04/20/18 04/20/19  Charlcie Cradle, MD  Fish Oil-Cholecalciferol (FISH OIL + D3 PO) Take by mouth.    [provider]  fluticasone (FLONASE) 50 MCG/ACT nasal spray Place 1 spray into both nostrils daily. 08/11/17   Shawnee Knapp, MD  fluticasone (FLOVENT HFA) 110 MCG/ACT inhaler Inhale 2 puffs into the lungs 2 (two) times daily. 10/11/17   Juanito Doom, MD  indomethacin (INDOCIN) 25 MG capsule Take 1  capsule (25 mg total) by mouth 3 (three) times daily as needed. 04/15/18   Wieters, Hallie C, PA-C  lithium 600 MG capsule Take 1 capsule (600 mg total) by mouth 2 (two) times daily with a meal. 04/20/18   Charlcie Cradle, MD  meloxicam (MOBIC) 15 MG tablet Take 1 tablet (15 mg total) by mouth daily. 10/31/17   Shawnee Knapp, MD  metFORMIN (GLUCOPHAGE) 500 MG tablet  09/22/17   [provider]  mirtazapine (REMERON) 30 MG tablet Take 2 tablets (60 mg total) by mouth at bedtime. 04/20/18 04/20/19  Charlcie Cradle, MD  omeprazole (PRILOSEC) 40 MG capsule Take 40 mg by mouth daily.    [provider]  predniSONE (STERAPRED UNI-PAK 21 TAB) 10 MG (21) TBPK tablet Take by mouth daily. Begin with 6 tablets today, 5 tablets on day 2, 4 on day 3, 3 on day 4, 2 on day 5, 1 on day 6 04/15/18   Wieters, [employer] C, PA-C  promethazine-codeine (PHENERGAN WITH CODEINE) 6.25-10 MG/5ML syrup Take 5 mLs by mouth at bedtime as needed for cough. Patient not taking: Reported on 04/20/2018 01/09/18   Horald Pollen, MD  tamsulosin (FLOMAX) 0.4 MG CAPS capsule Take 1 capsule (0.4 mg total) by mouth daily. 08/11/17   Shawnee Knapp, MD  traZODone (DESYREL) 100 MG tablet Take 1.5 tablets (150 mg total) by mouth at bedtime. May also take 1 tablet (100 mg total) at bedtime as needed for sleep (insomnia). 04/20/18   Charlcie Cradle, MD    Family History Family History  Problem Relation Age of Onset  . Depression Mother   . Anxiety disorder Mother   . Schizophrenia Father   . Suicidality Father   . Suicidality Paternal Grandfather   . Drug abuse Paternal Grandfather   . Alcohol abuse Paternal Grandfather     Social History Social History   Tobacco Use  . Smoking status: Former Smoker    Packs/day: 1.50    Years: 20.00    Pack years: 30.00    Last attempt to quit: 05/03/1998    Years since quitting: 20.0  . Smokeless tobacco: Never Used  Substance Use Topics  . Alcohol use: No    Comment:  socially-small amount of wine monthly - 1 glass   . Drug use: No    Types: Marijuana    Comment: last times was over 1 yr ago     Allergies   Bee pollen; Milk-related compounds; and Penicillins   Review of Systems Review of Systems  All other systems reviewed and are negative.    Physical Exam Triage Vital Signs ED Triage Vitals  Enc Vitals Group     BP 05/12/18 0957 130/90     Pulse Rate 05/12/18 0957 85     Resp 05/12/18 0957 14     Temp 05/12/18 0957 98.4 F (36.9 C)     Temp Source 05/12/18 0957 Oral     SpO2 05/12/18  0957 97 %     Weight --      Height --      Pain Score 05/12/18 1001 4     Pain Loc --    Updated Vital Signs BP 130/90 (BP Location: Right Arm)   Pulse 85   Temp 98.4 F (36.9 C) (Oral)   Resp 14   SpO2 97%  Physical Exam Vitals signs and nursing note reviewed.  Constitutional:      General: He is not in acute distress.    Comments: Alert, nicely groomed Sitting up on the exam table, looks ill but not toxic Coughing, voice is congested  HENT:     Head: Atraumatic.     Comments: Right TM is dull and red, left TM is translucent but pink flushed Moderate nasal congestion bilaterally with mucousy material present Mucous membranes are slightly dry Eyes:     Comments: Conjugate gaze, no eye redness/drainage  Neck:     Musculoskeletal: Neck supple.  Cardiovascular:     Rate and Rhythm: Normal rate and regular rhythm.  Pulmonary:     Effort: No respiratory distress.     Breath sounds: No wheezing or rales.     Comments: Coarse breath sounds throughout, diminished breath sounds in the left lower anterior lung fields Abdominal:     General: There is no distension.  Musculoskeletal: Normal range of motion.  Skin:    General: Skin is warm and dry.     Comments: No cyanosis  Neurological:     Mental Status: He is alert and oriented to person, place, and time.      UC Treatments / Results   Radiology EXAM: CHEST - 2  VIEW  COMPARISON:  09/23/2017  FINDINGS: The cardiomediastinal silhouette is within normal limits. No airspace consolidation, edema, pleural effusion, or pneumothorax is identified. There is mild thoracic dextroscoliosis.  IMPRESSION: No active cardiopulmonary disease.   Electronically Signed   By: Logan Bores M.D.   On: 05/12/2018 10:49    Final Clinical Impressions(s) / UC Diagnoses   Final diagnoses:  Acute right otitis media  Acute upper respiratory infection     Discharge Instructions     Symptoms today seem most consistent with right ear infection and upper respiratory infection.  Chest xray did not demonstrate any pneumonia.  Push fluids and rest.  Prescriptions for zithromax (antibiotic) and benzonatate (for cough) were sent to the pharmacy.  Anticipate gradual improvement in well being, cough, over the next several days.  Cough may take a couple weeks to subside.  Recheck for persistent (>3 more days) fever >100.5, increasing phlegm production/nasal discharge, or if not starting to improve in a few days.       ED Prescriptions    Medication Sig Dispense Auth. Provider   azithromycin (ZITHROMAX) 250 MG tablet Take 1 tablet (250 mg total) by mouth daily. Take first 2 tablets together, then 1 every day until finished. 6 tablet Wynona Luna, MD   benzonatate (TESSALON) 200 MG capsule Take 1 capsule (200 mg total) by mouth 3 (three) times daily as needed for cough. 30 capsule Wynona Luna, MD        Wynona Luna, MD 05/14/18 2219

## 2018-05-18 DIAGNOSIS — F4310 Post-traumatic stress disorder, unspecified: Secondary | ICD-10-CM | POA: Diagnosis not present

## 2018-05-22 DIAGNOSIS — F4310 Post-traumatic stress disorder, unspecified: Secondary | ICD-10-CM | POA: Diagnosis not present

## 2018-05-25 DIAGNOSIS — F4310 Post-traumatic stress disorder, unspecified: Secondary | ICD-10-CM | POA: Diagnosis not present

## 2018-05-25 MED FILL — ATORVASTATIN 40 MG TABLET: 40 | 90 days supply | Qty: 90 | Fill #1

## 2018-05-25 MED FILL — clonazePAM 1 MG TABS: 1 | 30 days supply | Qty: 90 | Fill #0

## 2018-05-25 MED FILL — traZODone HCL 100 MG TABS: 100 | 90 days supply | Qty: 225 | Fill #0

## 2018-05-25 MED FILL — MELOXICAM 15 MG TABLET: 15 | 30 days supply | Qty: 30 | Fill #1

## 2018-06-01 DIAGNOSIS — F4310 Post-traumatic stress disorder, unspecified: Secondary | ICD-10-CM | POA: Diagnosis not present

## 2018-06-05 DIAGNOSIS — F4310 Post-traumatic stress disorder, unspecified: Secondary | ICD-10-CM | POA: Diagnosis not present

## 2018-06-08 DIAGNOSIS — F4310 Post-traumatic stress disorder, unspecified: Secondary | ICD-10-CM | POA: Diagnosis not present

## 2018-06-12 DIAGNOSIS — F4310 Post-traumatic stress disorder, unspecified: Secondary | ICD-10-CM | POA: Diagnosis not present

## 2018-06-15 DIAGNOSIS — F4310 Post-traumatic stress disorder, unspecified: Secondary | ICD-10-CM | POA: Diagnosis not present

## 2018-06-19 DIAGNOSIS — F4310 Post-traumatic stress disorder, unspecified: Secondary | ICD-10-CM | POA: Diagnosis not present

## 2018-06-22 ENCOUNTER — Encounter (HOSPITAL_COMMUNITY): Payer: Self-pay | Admitting: Psychiatry

## 2018-06-22 ENCOUNTER — Ambulatory Visit (HOSPITAL_COMMUNITY): Payer: Medicare PPO | Admitting: Psychiatry

## 2018-06-22 VITALS — BP 118/68 | Ht 72.0 in | Wt 179.0 lb

## 2018-06-22 DIAGNOSIS — F3181 Bipolar II disorder: Secondary | ICD-10-CM | POA: Diagnosis not present

## 2018-06-22 DIAGNOSIS — F424 Excoriation (skin-picking) disorder: Secondary | ICD-10-CM

## 2018-06-22 DIAGNOSIS — F4310 Post-traumatic stress disorder, unspecified: Secondary | ICD-10-CM

## 2018-06-22 DIAGNOSIS — F5105 Insomnia due to other mental disorder: Secondary | ICD-10-CM

## 2018-06-22 DIAGNOSIS — F411 Generalized anxiety disorder: Secondary | ICD-10-CM | POA: Diagnosis not present

## 2018-06-22 MED ORDER — N-ACETYL-L-CYSTEINE POWD: 1.0000 [IU]/d | Freq: Every day | 5 refills | Status: DC

## 2018-06-22 MED ORDER — MIRTAZAPINE 30 MG PO TABS: 60.0000 mg | ORAL_TABLET | Freq: Every day | ORAL | 0 refills | Status: DC

## 2018-06-22 MED ORDER — CLONAZEPAM 1 MG PO TBDP: 1.0000 mg | ORAL_TABLET | Freq: Three times a day (TID) | ORAL | 2 refills | Status: DC | PRN

## 2018-06-22 MED ORDER — TRAZODONE HCL 100 MG PO TABS: ORAL_TABLET | ORAL | 0 refills | Status: DC

## 2018-06-22 MED ORDER — ESCITALOPRAM OXALATE 10 MG PO TABS: 30.0000 mg | ORAL_TABLET | Freq: Every day | ORAL | 0 refills | Status: DC

## 2018-06-22 MED ORDER — LITHIUM CARBONATE 600 MG PO CAPS: 600.0000 mg | ORAL_CAPSULE | Freq: Two times a day (BID) | ORAL | 0 refills | Status: DC

## 2018-06-22 MED FILL — MIRTAZAPINE 30 MG TABLET: 30 | 90 days supply | Qty: 180 | Fill #0

## 2018-06-22 MED FILL — ESCITALOPRAM 10 MG TABLET: 10 | 90 days supply | Qty: 270 | Fill #0

## 2018-06-22 MED FILL — LITHIUM CARBONATE 600 MG CA: 600 | 90 days supply | Qty: 180 | Fill #0

## 2018-06-22 NOTE — Progress Notes (Signed)
BH MD/PA/NP OP Progress Note  06/22/2018 3:19 PM [name]  MRN:  [id number]  Chief Complaint:  Chief Complaint    Post-Traumatic Stress Disorder; Depression; Anxiety     HPI: Dereck shared that his wife has been diagnosed with congestive heart failure.  She is having many panic attacks and this is causing him to also have panic attacks.  His wife is manipulative and there are times where he gets very frustrated.  He has made some new friends and is trying to be more social.  He experienced a severe panic attack on the anniversary of his trauma and it happened to be at the time he was having dinner with the friends.  He was very embarrassed by it.  Marland did end up sharing that he has a mental illness and states that it was excepted rather well.  He does continue to see them and enjoys it.  He still only gets about 5 hours of sleep.  He continues to experience PTSD symptoms.  He is taking his Klonopin about twice a day.  He asked that the Klonopin be changed to a dissolvable tablet.  He is denying any manic or hypomanic-like symptoms since her last visit.  He tells me that he is skin picking and it has been worse lately.  He has tried many coping mechanisms but nothing is really helped.  He has been going on for about 50 years now.  He is agreeable to trying a supplement to see if it may help with the skin picking  Visit Diagnosis:    ICD-10-CM   1. Skin-picking disorder F42.4 Acetylcysteine (N-ACETYL-L-CYSTEINE) POWD    escitalopram (LEXAPRO) 10 MG tablet  2. PTSD (post-traumatic stress disorder) F43.10 clonazepam (KLONOPIN) 1 MG disintegrating tablet    escitalopram (LEXAPRO) 10 MG tablet    mirtazapine (REMERON) 30 MG tablet  3. GAD (generalized anxiety disorder) F41.1 clonazepam (KLONOPIN) 1 MG disintegrating tablet    escitalopram (LEXAPRO) 10 MG tablet    mirtazapine (REMERON) 30 MG tablet  4. Bipolar II disorder (HCC) F31.81 lithium 600 MG capsule  5. Insomnia related to  another mental disorder F51.05 mirtazapine (REMERON) 30 MG tablet    traZODone (DESYREL) 100 MG tablet      Past Psychiatric History:  Anxiety: Yes Bipolar Disorder: Yes Depression: Yes Mania: Yes Psychosis: No Schizophrenia: No Personality Disorder: No Hospitalization for psychiatric illness: Yes History of Electroconvulsive Shock Therapy: No Prior Suicide Attempts: Yes Previous meds: Seroquel, Abilify, Elavil, Wellbutrin, Ambien, Paxil, Effexor, Buspar   Past Medical History:  Past Medical History:  Diagnosis Date  . Anxiety   . Asthma   . COPD (chronic obstructive pulmonary disease) ([place])   . Depression   . Diabetes mellitus, type II ([place])   . GERD (gastroesophageal reflux disease)   . Gout   . HTN (hypertension)   . IBS (irritable bowel syndrome)   . Palpitations     Past Surgical History:  Procedure Laterality Date  . APPENDECTOMY    . FUNDOPLASTY TRANSTHORACIC    . HERNIA REPAIR    . RECONSTRUCTION OF NOSE      Family Psychiatric History:  Family History  Problem Relation Age of Onset  . Depression Mother   . Anxiety disorder Mother   . Schizophrenia Father   . Suicidality Father   . Suicidality Paternal Grandfather   . Drug abuse Paternal Grandfather   . Alcohol abuse Paternal Grandfather     Social History:  Social History   Socioeconomic  History  . Marital status: Married    Spouse name: Not on file  . Number of children: Not on file  . Years of education: Not on file  . Highest education level: Not on file  Occupational History  . Not on file  Social Needs  . Financial resource strain: Not on file  . Food insecurity:    Worry: Not on file    Inability: Not on file  . Transportation needs:    Medical: Not on file    Non-medical: Not on file  Tobacco Use  . Smoking status: Former Smoker    Packs/day: 1.50    Years: 20.00    Pack years: 30.00    Last attempt to quit: 05/03/1998    Years since quitting: 20.1  . Smokeless tobacco:  Never Used  Substance and Sexual Activity  . Alcohol use: No    Comment: socially-small amount of wine monthly - 1 glass   . Drug use: No    Types: Marijuana    Comment: last times was over 1 yr ago  . Sexual activity: Never  Lifestyle  . Physical activity:    Days per week: Not on file    Minutes per session: Not on file  . Stress: Not on file  Relationships  . Social connections:    Talks on phone: Not on file    Gets together: Not on file    Attends religious service: Not on file    Active member of club or organization: Not on file    Attends meetings of clubs or organizations: Not on file    Relationship status: Not on file  Other Topics Concern  . Not on file  Social History Narrative  . Not on file    Allergies:  Allergies  Allergen Reactions  . Bee Pollen   . Milk-Related Compounds Diarrhea  . Penicillins Rash    Metabolic Disorder Labs: Lab Results  Component Value Date   HGBA1C 5.5 10/31/2017   No results found for: PROLACTIN Lab Results  Component Value Date   CHOL 146 11/04/2017   TRIG 267 (H) 11/04/2017   HDL 38 (L) 11/04/2017   CHOLHDL 3.8 11/04/2017   LDLCALC 55 11/04/2017   Lab Results  Component Value Date   TSH 2.490 07/12/2017    Therapeutic Level Labs: No results found for: LITHIUM No results found for: VALPROATE No components found for:  CBMZ  Current Medications: Current Outpatient Medications  Medication Sig Dispense Refill  . albuterol (PROAIR HFA) 108 (90 Base) MCG/ACT inhaler Inhale 1-2 puffs into the lungs every 6 (six) hours as needed for wheezing or shortness of breath. 1 Inhaler 3  . atorvastatin (LIPITOR) 40 MG tablet Take 1 tablet (40 mg total) by mouth daily. 90 tablet 1  . azithromycin (ZITHROMAX) 250 MG tablet Take 1 tablet (250 mg total) by mouth daily. Take first 2 tablets together, then 1 every day until finished. 6 tablet 0  . benzonatate (TESSALON) 200 MG capsule Take 1 capsule (200 mg total) by mouth 3 (three)  times daily as needed for cough. 30 capsule 0  . escitalopram (LEXAPRO) 10 MG tablet Take 3 tablets (30 mg total) by mouth daily. 270 tablet 0  . Fish Oil-Cholecalciferol (FISH OIL + D3 PO) Take by mouth.    . fluticasone (FLONASE) 50 MCG/ACT nasal spray Place 1 spray into both nostrils daily. 16 g 5  . fluticasone (FLOVENT HFA) 110 MCG/ACT inhaler Inhale 2 puffs into the lungs 2 (  two) times daily. 1 Inhaler 5  . indomethacin (INDOCIN) 25 MG capsule Take 1 capsule (25 mg total) by mouth 3 (three) times daily as needed. 30 capsule 0  . lithium 600 MG capsule Take 1 capsule (600 mg total) by mouth 2 (two) times daily with a meal. 180 capsule 0  . meloxicam (MOBIC) 15 MG tablet Take 1 tablet (15 mg total) by mouth daily. 30 tablet 1  . metFORMIN (GLUCOPHAGE) 500 MG tablet     . mirtazapine (REMERON) 30 MG tablet Take 2 tablets (60 mg total) by mouth at bedtime. 180 tablet 0  . omeprazole (PRILOSEC) 40 MG capsule Take 40 mg by mouth daily.    . predniSONE (STERAPRED UNI-PAK 21 TAB) 10 MG (21) TBPK tablet Take by mouth daily. Begin with 6 tablets today, 5 tablets on day 2, 4 on day 3, 3 on day 4, 2 on day 5, 1 on day 6 21 tablet 0  . tamsulosin (FLOMAX) 0.4 MG CAPS capsule Take 1 capsule (0.4 mg total) by mouth daily. 180 capsule 1  . traZODone (DESYREL) 100 MG tablet Take 1.5 tablets (150 mg total) by mouth at bedtime. May also take 1 tablet (100 mg total) at bedtime as needed for sleep (insomnia). 225 tablet 0  . Acetylcysteine (N-ACETYL-L-CYSTEINE) POWD 1 Units/day by Does not apply route daily. 1 Bottle 5  . clonazepam (KLONOPIN) 1 MG disintegrating tablet Take 1 tablet (1 mg total) by mouth 3 (three) times daily as needed. 60 tablet 2  . promethazine-codeine (PHENERGAN WITH CODEINE) 6.25-10 MG/5ML syrup Take 5 mLs by mouth at bedtime as needed for cough. (Patient not taking: Reported on 04/20/2018) 120 mL 0   No current facility-administered medications for this visit.       Musculoskeletal: Strength & Muscle Tone: within normal limits Gait & Station: normal Patient leans: N/A  Psychiatric Specialty Exam: Review of Systems  Constitutional: Negative for chills, diaphoresis and fever.  Neurological: Positive for headaches. Negative for speech change and loss of consciousness.    Blood pressure 118/68, height 6' (1.829 m), weight 179 lb (81.2 kg).Body mass index is 24.28 kg/m.  General Appearance: Fairly Groomed  Eye Contact:  Good  Speech:  Clear and Coherent and Normal Rate  Volume:  Normal  Mood:  Anxious and Depressed  Affect:  Full Range  Thought Process:  Goal Directed and Descriptions of Associations: Intact  Orientation:  Full (Time, Place, and Person)  Thought Content:  Logical  Suicidal Thoughts:  No  Homicidal Thoughts:  No  Memory:  Immediate;   Good  Judgement:  Good  Insight:  Good  Psychomotor Activity:  Normal  Concentration:  Concentration: Good  Recall:  Good  Fund of Knowledge:  Good  Language:  Good  Akathisia:  No  Handed:  Right  AIMS (if indicated):     Assets:  Communication Skills Desire for Improvement Financial Resources/Insurance [place] Talents/Skills Transportation  ADL's:  Intact  Cognition:  WNL  Sleep:            Screenings: PHQ2-9     Office Visit from 01/19/2018 in Primary Care at [facility] from 01/09/2018 in Primary Care at [facility] from 11/26/2017 in Primary Care at [facility] from 10/31/2017 in Primary Care at [facility] from 07/12/2017 in Primary Care at [facility] Total Score  0  0  0  0  4  PHQ-9 Total Score  -  -  -  -  17     I reviewed the information below on 06/22/2018 and have updated it Assessment and Plan: PTSD; Bipolar II-depressed; GAD; Skin picking disorder; Insomnia    Medication management with supportive therapy. Risks and benefits, side effects and alternative treatment options discussed with patient. Pt  was given an opportunity to ask questions about medication, illness, and treatment. All current psychiatric medications have been reviewed and discussed with the patient and adjusted as clinically appropriate. The patient has been provided an accurate and updated list of the medications being now prescribed. Patient expressed understanding of how their medications were to be used.  Pt verbalized understanding and verbal consent obtained for treatment.  Status of current problems: Ongoing  Meds: Lithium 600mg  po BID for Bipolar II Klonopin 1mg  po TID for anxiety Remeron 60mg  po qHS for anxiety and depression Lexapro 30mg  po qD for depression and anxiety- pt is aware the dose is higher than FDA recommended and denies SE. He is agreeable to dose  Trazodone 150mg  po qHS for insomnia and 100mg  po qHS prn middle of the night insomnia Start N-acetylcysysteine 1 tab a day for skin picking  Labs: none  Therapy: brief supportive therapy provided. Discussed psychosocial stressors in detail.   Discussed behavioral techniques to deal with skin picking   Consultations: Encouraged to follow up with therapist Encouraged to follow up with PCP as needed  Pt denies SI and is at an acute low risk for suicide. Patient told to call clinic if any problems occur. Patient advised to go to ER if they should develop SI/HI, side effects, or if symptoms worsen. Has crisis numbers to call if needed. Pt verbalized understanding.  F/up in 2 months or sooner if needed  The duration of this appointment visit was 30 minutes of face-to-face time with the patient.  Greater than 50% of this time was spent in counseling, explanation of  diagnosis, planning of further management, and coordination of care   Charlcie Cradle, MD 06/22/2018, 3:19 PM

## 2018-06-26 MED FILL — clonazePAM 1 MG TBDP: 1 | 20 days supply | Qty: 60 | Fill #0

## 2018-06-29 DIAGNOSIS — F4310 Post-traumatic stress disorder, unspecified: Secondary | ICD-10-CM | POA: Diagnosis not present

## 2018-07-01 ENCOUNTER — Telehealth: Payer: Self-pay | Admitting: Family Medicine

## 2018-07-01 NOTE — Telephone Encounter (Signed)
mychart message sent to pt about their appointment with dr Brigitte Pulse.

## 2018-07-03 DIAGNOSIS — F4310 Post-traumatic stress disorder, unspecified: Secondary | ICD-10-CM | POA: Diagnosis not present

## 2018-07-06 DIAGNOSIS — F4310 Post-traumatic stress disorder, unspecified: Secondary | ICD-10-CM | POA: Diagnosis not present

## 2018-07-07 ENCOUNTER — Encounter: Payer: Medicare PPO | Admitting: Family Medicine

## 2018-07-14 DIAGNOSIS — F4310 Post-traumatic stress disorder, unspecified: Secondary | ICD-10-CM | POA: Diagnosis not present

## 2018-07-17 DIAGNOSIS — F4310 Post-traumatic stress disorder, unspecified: Secondary | ICD-10-CM | POA: Diagnosis not present

## 2018-07-20 DIAGNOSIS — F4310 Post-traumatic stress disorder, unspecified: Secondary | ICD-10-CM | POA: Diagnosis not present

## 2018-07-25 ENCOUNTER — Telehealth: Payer: Self-pay | Admitting: Family Medicine

## 2018-07-25 NOTE — Telephone Encounter (Signed)
Copied from [place] [id number]. Topic: Quick Communication - Rx Refill/Question >> Jul 25, 2018  8:33 AM Lionel December wrote: Medication:  metFORMIN (GLUCOPHAGE) 500 MG tablet   Has the patient contacted their pharmacy? Yes.   (Agent: If no, request that the patient contact the pharmacy for the refill.) (Agent: If yes, when and what did the pharmacy advise?)  Preferred Pharmacy (with phone number or street name): [place], [place] - [place] [phone] (Phone) [phone] (Fax)    Agent: Please be advised that RX refills may take up to 3 business days. We ask that you follow-up with your pharmacy.

## 2018-07-26 NOTE — Telephone Encounter (Signed)
Needs a tele med apt for toc and diabetes f/u for medication refill. Metformin has never been prescribed here.

## 2018-07-27 DIAGNOSIS — F4310 Post-traumatic stress disorder, unspecified: Secondary | ICD-10-CM | POA: Diagnosis not present

## 2018-07-28 ENCOUNTER — Encounter: Payer: Self-pay | Admitting: Family Medicine

## 2018-07-28 ENCOUNTER — Ambulatory Visit (INDEPENDENT_AMBULATORY_CARE_PROVIDER_SITE_OTHER): Payer: Medicare PPO | Admitting: Family Medicine

## 2018-07-28 DIAGNOSIS — I10 Essential (primary) hypertension: Secondary | ICD-10-CM | POA: Diagnosis not present

## 2018-07-28 DIAGNOSIS — E118 Type 2 diabetes mellitus with unspecified complications: Secondary | ICD-10-CM | POA: Diagnosis not present

## 2018-07-28 DIAGNOSIS — Z7689 Persons encountering health services in other specified circumstances: Secondary | ICD-10-CM | POA: Diagnosis not present

## 2018-07-28 DIAGNOSIS — E785 Hyperlipidemia, unspecified: Secondary | ICD-10-CM

## 2018-07-28 MED ORDER — METFORMIN HCL 500 MG PO TABS: 500.0000 mg | ORAL_TABLET | Freq: Two times a day (BID) | ORAL | 3 refills | Status: DC

## 2018-07-28 MED FILL — metFORMIN HCL 500 MG TABS: 500 | 90 days supply | Qty: 180 | Fill #0

## 2018-07-28 NOTE — Progress Notes (Signed)
Virtual Visit via Video Note  I connected with Matthew Farr. on 07/28/18 at 10:30 AM EDT by a video enabled telemedicine application and verified that I am speaking with the correct person using two identifiers. Location patient: home Location provider: work  Persons participating in the virtual visit: patient, provider  I discussed the limitations of evaluation and management by telemedicine and the availability of in person appointments. The patient expressed understanding and agreed to proceed.  Chief Complaint  Patient presents with  . Establish Care    metformin refill?    HPI: Matthew Ortiz. is a 58 y.o. male is establishing care with our office (previous PCP Dr. Delman Cheadle) and is in need of a refill of his metformin 500mg  BID. He checks his BS BID. Fasting today was 138 and typically they run 90-130. No hypo and hyperglycemia episodes recently.  Last eye exam: 08/2017 - no retinopathy, B/L cataracts Diet: limits bread, carbs and eats in moderate amounts Exercise: goes to [facility] (not currently while it is closed during COVID-19 pandemic) His last labs were in 10/2017 and are below. DM is well-controlled.  He does not regularly check his BP but denies HA, CP, SOB, LE edema, vision changes, dizziness.  Pt states he has gotten his TG down over time, as they had been above 800 at one point.   Lab Results  Component Value Date   HGBA1C 5.5 10/31/2017   Lab Results  Component Value Date   CHOL 146 11/04/2017   HDL 38 (L) 11/04/2017   LDLCALC 55 11/04/2017   TRIG 267 (H) 11/04/2017   CHOLHDL 3.8 11/04/2017   No results found for: Derl Barrow  Lab Results  Component Value Date   CREATININE 0.89 10/31/2017   Lab Results  Component Value Date   NA 140 10/31/2017   K 3.9 10/31/2017   CL 103 10/31/2017   CO2 23 10/31/2017    Past Medical History:  Diagnosis Date  . Anxiety   . Asthma   . COPD (chronic obstructive pulmonary disease) ([place])   . Depression    . Diabetes mellitus, type II ([place])   . GERD (gastroesophageal reflux disease)   . Gout   . HTN (hypertension)   . IBS (irritable bowel syndrome)   . Palpitations     Past Surgical History:  Procedure Laterality Date  . APPENDECTOMY    . FUNDOPLASTY TRANSTHORACIC    . HERNIA REPAIR    . RECONSTRUCTION OF NOSE      Family History  Problem Relation Age of Onset  . Depression Mother   . Anxiety disorder Mother   . Schizophrenia Father   . Suicidality Father   . Suicidality Paternal Grandfather   . Drug abuse Paternal Grandfather   . Alcohol abuse Paternal Grandfather     Social History   Tobacco Use  . Smoking status: Former Smoker    Packs/day: 1.50    Years: 20.00    Pack years: 30.00    Last attempt to quit: 05/03/1998    Years since quitting: 20.2  . Smokeless tobacco: Never Used  Substance Use Topics  . Alcohol use: No    Comment: socially-small amount of wine monthly - 1 glass   . Drug use: No    Types: Marijuana    Comment: last times was over 1 yr ago     Current Outpatient Medications:  .  Acetylcysteine (N-ACETYL-L-CYSTEINE) POWD, 1 Units/day by Does not apply route daily., Disp: 1 Bottle, Rfl: 5 .  albuterol (PROAIR HFA) 108 (90 Base) MCG/ACT inhaler, Inhale 1-2 puffs into the lungs every 6 (six) hours as needed for wheezing or shortness of breath., Disp: 1 Inhaler, Rfl: 3 .  atorvastatin (LIPITOR) 40 MG tablet, Take 40 mg by mouth daily., Disp: , Rfl:  .  clonazepam (KLONOPIN) 1 MG disintegrating tablet, Take 1 tablet (1 mg total) by mouth 3 (three) times daily as needed., Disp: 60 tablet, Rfl: 2 .  escitalopram (LEXAPRO) 10 MG tablet, Take 3 tablets (30 mg total) by mouth daily., Disp: 270 tablet, Rfl: 0 .  Fish Oil-Cholecalciferol (FISH OIL + D3 PO), Take by mouth., Disp: , Rfl:  .  fluticasone (FLONASE) 50 MCG/ACT nasal spray, Place 1 spray into both nostrils daily., Disp: 16 g, Rfl: 5 .  fluticasone (FLOVENT HFA) 110 MCG/ACT inhaler, Inhale 2 puffs  into the lungs 2 (two) times daily., Disp: 1 Inhaler, Rfl: 5 .  indomethacin (INDOCIN) 25 MG capsule, Take 1 capsule (25 mg total) by mouth 3 (three) times daily as needed., Disp: 30 capsule, Rfl: 0 .  lithium 600 MG capsule, Take 1 capsule (600 mg total) by mouth 2 (two) times daily with a meal., Disp: 180 capsule, Rfl: 0 .  meloxicam (MOBIC) 15 MG tablet, Take 1 tablet (15 mg total) by mouth daily., Disp: 30 tablet, Rfl: 1 .  metFORMIN (GLUCOPHAGE) 500 MG tablet, , Disp: , Rfl:  .  mirtazapine (REMERON) 30 MG tablet, Take 2 tablets (60 mg total) by mouth at bedtime., Disp: 180 tablet, Rfl: 0 .  omeprazole (PRILOSEC) 40 MG capsule, Take 40 mg by mouth daily., Disp: , Rfl:  .  traZODone (DESYREL) 100 MG tablet, Take 1.5 tablets (150 mg total) by mouth at bedtime. May also take 1 tablet (100 mg total) at bedtime as needed for sleep (insomnia)., Disp: 225 tablet, Rfl: 0 .  tamsulosin (FLOMAX) 0.4 MG CAPS capsule, Take 1 capsule (0.4 mg total) by mouth daily. (Patient not taking: Reported on 07/28/2018), Disp: 180 capsule, Rfl: 1  Allergies  Allergen Reactions  . Bee Pollen   . Milk-Related Compounds Diarrhea  . Penicillins Rash    ROS: See pertinent positives and negatives per HPI.   EXAM:  VITALS per patient if applicable: none BP Readings from Last 3 Encounters:  05/12/18 130/90  04/15/18 125/87  01/19/18 112/78     GENERAL: alert, oriented, appears well and in no acute distress  HEENT: atraumatic, conjunctiva clear, no obvious abnormalities on inspection of external nose and ears  NECK: normal movements of the head and neck  LUNGS: on inspection no signs of respiratory distress, breathing rate appears normal, no obvious gross SOB, gasping or wheezing, no conversational dyspnea  CV: no obvious cyanosis  MS: moves all visible extremities without noticeable abnormality  PSYCH/NEURO: pleasant and cooperative, no obvious depression or anxiety, speech and thought processing  grossly intact   ASSESSMENT AND PLAN:  Discussed the following assessment and plan:  1. Encounter to establish care with new doctor  2. Type 2 diabetes mellitus with complication, without long-term current use of insulin (HCC) - well-controlled, at goal - cont current meds, cont checking BS BID and keeping log - cont with diabetic diet and regular exercise Refill: - metFORMIN (GLUCOPHAGE) 500 MG tablet; Take 1 tablet (500 mg total) by mouth 2 (two) times daily with a meal.  Dispense: 180 tablet; Refill: 3 - eye exam UTD - OV in 3 mo for f/u and labs  3. Essential hypertension - well-controlled, not on  medication - appt in 3 mo and will check BMP at that time  4. Hyperlipidemia LDL goal <100 - at goal - cont lipitor 40mg  daily - appt in 3 mo and labs at that time  I discussed the assessment and treatment plan with the patient. The patient was provided an opportunity to ask questions and all were answered. The patient agreed with the plan and demonstrated an understanding of the instructions.   The patient was advised to call back or seek an in-person evaluation if the symptoms worsen or if the condition fails to improve as anticipated.  I provided 20 minutes of non-face-to-face time during this encounter.   Letta Median, DO

## 2018-07-31 DIAGNOSIS — F4310 Post-traumatic stress disorder, unspecified: Secondary | ICD-10-CM | POA: Diagnosis not present

## 2018-08-03 DIAGNOSIS — F4310 Post-traumatic stress disorder, unspecified: Secondary | ICD-10-CM | POA: Diagnosis not present

## 2018-08-07 ENCOUNTER — Encounter: Payer: Self-pay | Admitting: Family Medicine

## 2018-08-07 DIAGNOSIS — F4310 Post-traumatic stress disorder, unspecified: Secondary | ICD-10-CM | POA: Diagnosis not present

## 2018-08-09 ENCOUNTER — Encounter: Payer: Self-pay | Admitting: Family Medicine

## 2018-08-09 ENCOUNTER — Ambulatory Visit (INDEPENDENT_AMBULATORY_CARE_PROVIDER_SITE_OTHER): Payer: Medicare PPO | Admitting: Family Medicine

## 2018-08-09 VITALS — BP 127/83 | Temp 99.0°F

## 2018-08-09 DIAGNOSIS — R6889 Other general symptoms and signs: Secondary | ICD-10-CM

## 2018-08-09 DIAGNOSIS — Z20822 Contact with and (suspected) exposure to covid-19: Secondary | ICD-10-CM

## 2018-08-09 DIAGNOSIS — J4530 Mild persistent asthma, uncomplicated: Secondary | ICD-10-CM

## 2018-08-09 NOTE — Progress Notes (Signed)
Virtual Visit via Video Note  I connected with Matthew Ortiz. on 08/09/18 at  9:30 AM EDT by a video enabled telemedicine application and verified that I am speaking with the correct person using two identifiers. Location patient: home Location provider: work  Persons participating in the virtual visit: patient, provider  Interactive audio and video telecommunications were attempted between myself and the patient, however failed a few minutes into the encounter, due to the patient having technical difficulties. We continued and completed the visit with audio only.  I discussed the limitations of evaluation and management by telemedicine and the availability of in person appointments. The patient expressed understanding and agreed to proceed.  Chief Complaint  Patient presents with  . Cough    dry cough, aches and joint pain, chest tightness/3day/ OTC zyrtec     HPI: Matthew Koval. is a 58 y.o. male complains of 3 day h/o dry cough, increasing chest tightness, muscle and joint aches, and low grade fever (Tmax = 100.5 2 nights ago). + significant fatigue - worsening over the past 3 days. Pt states he has had bronchitis and other viral respiratory infections in the past and has "never felt this bad." Last temp was 100.3 this AM. Denies SOB. Denies chills, sore throat, ear pain, headache. Denies abd pain, diarrhea, n/v. + episode of diaphoresis this AM. Pt has been taking zyrtec daily. This has helped significantly with his runny nose, stuffy nose that he has had for a few weeks and attributes to allergies. Pt has used albuterol inhaler 2x in the past 3 days. He uses his flovent BID.  Pt has a h/o mild persistent asthma, well-controlled type 2 DM, HTN.  No known exposure to individual with COVID-19 infection.    Past Medical History:  Diagnosis Date  . Anxiety   . Asthma   . COPD (chronic obstructive pulmonary disease) ([place])   . Depression   . Diabetes mellitus, type II ([place])   .  GERD (gastroesophageal reflux disease)   . Gout   . HTN (hypertension)   . IBS (irritable bowel syndrome)   . Palpitations     Past Surgical History:  Procedure Laterality Date  . APPENDECTOMY    . FUNDOPLASTY TRANSTHORACIC    . HERNIA REPAIR    . RECONSTRUCTION OF NOSE      Family History  Problem Relation Age of Onset  . Depression Mother   . Anxiety disorder Mother   . Schizophrenia Father   . Suicidality Father   . Suicidality Paternal Grandfather   . Drug abuse Paternal Grandfather   . Alcohol abuse Paternal Grandfather     Social History   Tobacco Use  . Smoking status: Former Smoker    Packs/day: 1.50    Years: 20.00    Pack years: 30.00    Last attempt to quit: 05/03/1998    Years since quitting: 20.2  . Smokeless tobacco: Never Used  Substance Use Topics  . Alcohol use: No    Comment: socially-small amount of wine monthly - 1 glass   . Drug use: No    Types: Marijuana    Comment: last times was over 1 yr ago     Current Outpatient Medications:  .  Acetylcysteine (N-ACETYL-L-CYSTEINE) POWD, 1 Units/day by Does not apply route daily., Disp: 1 Bottle, Rfl: 5 .  albuterol (PROAIR HFA) 108 (90 Base) MCG/ACT inhaler, Inhale 1-2 puffs into the lungs every 6 (six) hours as needed for wheezing or shortness of breath., Disp:  1 Inhaler, Rfl: 3 .  atorvastatin (LIPITOR) 40 MG tablet, Take 40 mg by mouth daily., Disp: , Rfl:  .  clonazepam (KLONOPIN) 1 MG disintegrating tablet, Take 1 tablet (1 mg total) by mouth 3 (three) times daily as needed., Disp: 60 tablet, Rfl: 2 .  escitalopram (LEXAPRO) 10 MG tablet, Take 3 tablets (30 mg total) by mouth daily., Disp: 270 tablet, Rfl: 0 .  Fish Oil-Cholecalciferol (FISH OIL + D3 PO), Take by mouth., Disp: , Rfl:  .  fluticasone (FLONASE) 50 MCG/ACT nasal spray, Place 1 spray into both nostrils daily., Disp: 16 g, Rfl: 5 .  fluticasone (FLOVENT HFA) 110 MCG/ACT inhaler, Inhale 2 puffs into the lungs 2 (two) times daily., Disp: 1  Inhaler, Rfl: 5 .  indomethacin (INDOCIN) 25 MG capsule, Take 1 capsule (25 mg total) by mouth 3 (three) times daily as needed., Disp: 30 capsule, Rfl: 0 .  lithium 600 MG capsule, Take 1 capsule (600 mg total) by mouth 2 (two) times daily with a meal., Disp: 180 capsule, Rfl: 0 .  meloxicam (MOBIC) 15 MG tablet, Take 1 tablet (15 mg total) by mouth daily., Disp: 30 tablet, Rfl: 1 .  metFORMIN (GLUCOPHAGE) 500 MG tablet, Take 1 tablet (500 mg total) by mouth 2 (two) times daily with a meal., Disp: 180 tablet, Rfl: 3 .  mirtazapine (REMERON) 30 MG tablet, Take 2 tablets (60 mg total) by mouth at bedtime., Disp: 180 tablet, Rfl: 0 .  omeprazole (PRILOSEC) 40 MG capsule, Take 40 mg by mouth daily., Disp: , Rfl:  .  traZODone (DESYREL) 100 MG tablet, Take 1.5 tablets (150 mg total) by mouth at bedtime. May also take 1 tablet (100 mg total) at bedtime as needed for sleep (insomnia)., Disp: 225 tablet, Rfl: 0 .  tamsulosin (FLOMAX) 0.4 MG CAPS capsule, Take 1 capsule (0.4 mg total) by mouth daily. (Patient not taking: Reported on 07/28/2018), Disp: 180 capsule, Rfl: 1  Allergies  Allergen Reactions  . Bee Pollen   . Milk-Related Compounds Diarrhea  . Penicillins Rash    ROS: See pertinent positives and negatives per HPI.   EXAM:  BP 127/83 Comment: pt reported  Temp 99 F (37.2 C) (Oral) Comment: pt reported   GENERAL: alert, oriented,and in no acute distress  HEENT: atraumatic, conjunctiva clear, no obvious abnormalities on inspection of external nose and ears  NECK: normal movements of the head and neck  LUNGS: on inspection no signs of respiratory distress, breathing rate appears normal, no obvious gross SOB, gasping or wheezing, no conversational dyspnea  CV: no obvious cyanosis  PSYCH/NEURO: pleasant and cooperative, no obvious depression or anxiety, speech and thought processing grossly intact   ASSESSMENT AND PLAN: 1. Suspected Covid-19 Virus Infection 2. Mild persistent  asthma without complication - symptoms consistent with and concerning for COVID-19 infection - pt has mild intermittent asthma and is on flovent BID, albuterol inhaler PRN - cont these - discussed need for self-quarantine x 14 days and pt states he has been home with his wife (asymptomatic currently) for the past 4 days and will stay there for at least the next 10 days - continue supportive care to include increase fluid intake, rest, tylenol PRN, nasal saline spray - cont zyrtec daily, can add flonase daily - advised pt to f/u if he notes increasing SOB and/or chest tightness that he is not able to control at home with his albuterol and pt agrees to do so    I discussed the assessment and  treatment plan with the patient. The patient was provided an opportunity to ask questions and all were answered. The patient agreed with the plan and demonstrated an understanding of the instructions.   The patient was advised to call back or seek an in-person evaluation if the symptoms worsen or if the condition fails to improve as anticipated.   Letta Median, DO

## 2018-08-10 DIAGNOSIS — F4310 Post-traumatic stress disorder, unspecified: Secondary | ICD-10-CM | POA: Diagnosis not present

## 2018-08-11 ENCOUNTER — Encounter: Payer: Self-pay | Admitting: Family Medicine

## 2018-08-14 DIAGNOSIS — F4310 Post-traumatic stress disorder, unspecified: Secondary | ICD-10-CM | POA: Diagnosis not present

## 2018-08-17 DIAGNOSIS — F4310 Post-traumatic stress disorder, unspecified: Secondary | ICD-10-CM | POA: Diagnosis not present

## 2018-08-18 ENCOUNTER — Telehealth: Payer: Self-pay | Admitting: Pulmonary Disease

## 2018-08-18 NOTE — Telephone Encounter (Signed)
Thank you [facility] spoke with patient who reported that he had a virtual visit with his PCP on 4.8.2020 - was informed will treat as COVID-19 without testing >> pt was recommended to quarantine, monitor symptoms, use rescue inhaler if needed.  Patient stated that he DOES feel better, but still feels tender and bruised in his LLL.  Denies any mucus production, increased SHOB.  Does have some occasional wheeze but the rescue inhaler helps with this.  Offered sooner telephone visit with this office but patient reported that he really does not feel that is necessary and thinks he is okay to wait until 4.21.2020.  Did advise patient that we have on-call physicians that he may call over the weekend if needed and to seek emergency attention if symptoms worsen.  Patient voiced his understanding.  Nothing further needed at this time; will sign off.

## 2018-08-21 DIAGNOSIS — F4310 Post-traumatic stress disorder, unspecified: Secondary | ICD-10-CM | POA: Diagnosis not present

## 2018-08-22 ENCOUNTER — Ambulatory Visit (INDEPENDENT_AMBULATORY_CARE_PROVIDER_SITE_OTHER): Payer: Medicare PPO | Admitting: Primary Care

## 2018-08-22 ENCOUNTER — Ambulatory Visit (INDEPENDENT_AMBULATORY_CARE_PROVIDER_SITE_OTHER): Payer: Medicare PPO

## 2018-08-22 ENCOUNTER — Telehealth: Payer: Self-pay | Admitting: Primary Care

## 2018-08-22 ENCOUNTER — Other Ambulatory Visit: Payer: Self-pay

## 2018-08-22 ENCOUNTER — Encounter: Payer: Self-pay | Admitting: Primary Care

## 2018-08-22 DIAGNOSIS — R05 Cough: Secondary | ICD-10-CM

## 2018-08-22 DIAGNOSIS — R6889 Other general symptoms and signs: Secondary | ICD-10-CM

## 2018-08-22 DIAGNOSIS — R059 Cough, unspecified: Secondary | ICD-10-CM

## 2018-08-22 MED ORDER — BENZONATATE 200 MG PO CAPS: 200.0000 mg | ORAL_CAPSULE | Freq: Three times a day (TID) | ORAL | 1 refills | Status: DC | PRN

## 2018-08-22 MED FILL — BENZONATATE 200 MG CAP: 200 | 10 days supply | Qty: 30 | Fill #0

## 2018-08-22 NOTE — Progress Notes (Signed)
Virtual Visit via Telephone Note  I connected with Matthew Ortiz. on 08/22/18 at  9:00 AM EDT by telephone and verified that I am speaking with the correct person using two identifiers.   I discussed the limitations, risks, security and privacy concerns of performing an evaluation and management service by telephone and the availability of in person appointments. I also discussed with the patient that there may be a patient responsible charge related to this service. The patient expressed understanding and agreed to proceed.   History of Present Illness: 58 year old male, former smoker. PMH significant for asthma, chronic rhinitis, GERD, type 2 diabetes, HTN. Patient of Dr. Lake Bells, last seen by NP on 10/11/17. Maintained on Flovent.   08/22/2018 Patient called today with complaints of cough x 2-3 weeks, associated temp max 100.5. Treated as covid-19. States that his symptoms came on abruptly. He feels better than what it was. Still has chest tightness and dry hacking cough. Pain in left lower and upper lung. Continues Flovent daily. Used Albuterol hfa prior handful of times. Last fever was 8-10 days ago. He has been practicing isolation. Went out of his house yesterday for some essentials but wore a mask.  Observations/Objective:  - No shortness of breath, wheezing or cough  Assessment and Plan:  Flu like illness - Suspected COVID-19 one to two weeks ago - He has been afebrile for 8-10 days  - Testing for SAR-COV2 serology  Cough/ left pleuretic pain - Checking chest XR - RX tessalon perles 200mg  TID prn cough - Advised delsym cough syrup twice a week   Follow Up Instructions:    I discussed the assessment and treatment plan with the patient. The patient was provided an opportunity to ask questions and all were answered. The patient agreed with the plan and demonstrated an understanding of the instructions.   The patient was advised to call back or seek an in-person evaluation if  the symptoms worsen or if the condition fails to improve as anticipated.  I provided 22 minutes of non-face-to-face time during this encounter.   Martyn Ehrich, NP

## 2018-08-22 NOTE — Telephone Encounter (Signed)
Called the patient and advised of the xray results. Patient voiced understanding. Nothing further needed at this time.

## 2018-08-22 NOTE — Patient Instructions (Addendum)
Flu like illness - Suspected COVID-19 one to two weeks ago - He has been afebrile for 8-10 days  - Testing for SAR-COV2 serology  Cough/ left pleuretic pain - Checking chest XR - RX tessalon perles 200mg  TID prn cough - Advised delsym cough syrup twice a week  Asthma - Continue Flovent  - Used Albuterol rescue inhaler 2-3 times a day for shortness of breath/chest tightness/cough  Orders: - Labs and CXR 08/22/2018   Recommendations:  - Stay home as much as possible, avoid close contact with others, go to grocery store early in the morning during hours specially set aside for people over the age of 40, wash your hands frequently, stay well hydrated, monitor your temperature and report if >100  - Wear a mask while out of house  - Avoid direct contact with others until you are free from fever for 3 days and free from respiratory symptoms for 7 days    Coronavirus (COVID-19) Are you at risk?  Are you at risk for the Coronavirus (COVID-19)?  To be considered HIGH RISK for Coronavirus (COVID-19), you have to meet the following criteria:  . Traveled to [place], [place], [place], [place] or [place]; or in the [place] to [place], [place], [place], or [place]; and have fever, cough, and shortness of breath within the last 2 weeks of travel OR . Been in close contact with a person diagnosed with COVID-19 within the last 2 weeks and have fever, cough, and shortness of breath . IF YOU DO NOT MEET THESE CRITERIA, YOU ARE CONSIDERED LOW RISK FOR COVID-19.  What to do if you are HIGH RISK for COVID-19?  Marland Kitchen If you are having a medical emergency, call 911. . Seek medical care right away. Before you go to a doctor's office, urgent care or emergency department, call ahead and tell them about your recent travel, contact with someone diagnosed with COVID-19, and your symptoms. You should receive instructions from your physician's office regarding next steps of care.  . When you arrive at  healthcare provider, tell the healthcare staff immediately you have returned from visiting [place], [place], [place], [place] or [place]; or traveled in the [place] to [place], [place], [place], or [place]; in the last two weeks or you have been in close contact with a person diagnosed with COVID-19 in the last 2 weeks.   . Tell the health care staff about your symptoms: fever, cough and shortness of breath. . After you have been seen by a medical provider, you will be either: o Tested for (COVID-19) and discharged home on quarantine except to seek medical care if symptoms worsen, and asked to  - Stay home and avoid contact with others until you get your results (4-5 days)  - Avoid travel on public transportation if possible (such as bus, train, or airplane) or o Sent to the Emergency Department by EMS for evaluation, COVID-19 testing, and possible admission depending on your condition and test results.  What to do if you are LOW RISK for COVID-19?  Reduce your risk of any infection by using the same precautions used for avoiding the common cold or flu:  Marland Kitchen Wash your hands often with soap and warm water for at least 20 seconds.  If soap and water are not readily available, use an alcohol-based hand sanitizer with at least 60% alcohol.  . If coughing or sneezing, cover your mouth and nose by coughing or sneezing into the elbow areas of  your shirt or coat, into a tissue or into your sleeve (not your hands). . Avoid shaking hands with others and consider head nods or verbal greetings only. . Avoid touching your eyes, nose, or mouth with unwashed hands.  . Avoid close contact with people who are sick. . Avoid places or events with large numbers of people in one location, like concerts or sporting events. . Carefully consider travel plans you have or are making. . If you are planning any travel outside or inside the [place], visit the CDC's Travelers' Health webpage for the latest health  notices. . If you have some symptoms but not all symptoms, continue to monitor at home and seek medical attention if your symptoms worsen. . If you are having a medical emergency, call 911.   [place] / e-Visit: [email or web address]         [facility]: [facility]: [phone]                   [facility]: [phone]

## 2018-08-23 LAB — SAR COV2 SEROLOGY (COVID19)AB(IGG),IA: SARS CoV2 AB IGG: NEGATIVE

## 2018-08-24 ENCOUNTER — Encounter (HOSPITAL_COMMUNITY): Payer: Self-pay | Admitting: Psychiatry

## 2018-08-24 ENCOUNTER — Other Ambulatory Visit: Payer: Self-pay

## 2018-08-24 ENCOUNTER — Ambulatory Visit (HOSPITAL_COMMUNITY): Payer: Medicare PPO | Admitting: Psychiatry

## 2018-08-24 DIAGNOSIS — F5105 Insomnia due to other mental disorder: Secondary | ICD-10-CM

## 2018-08-24 DIAGNOSIS — F4310 Post-traumatic stress disorder, unspecified: Secondary | ICD-10-CM

## 2018-08-24 DIAGNOSIS — F3181 Bipolar II disorder: Secondary | ICD-10-CM

## 2018-08-24 DIAGNOSIS — F411 Generalized anxiety disorder: Secondary | ICD-10-CM

## 2018-08-24 MED ORDER — TRAZODONE HCL 100 MG PO TABS: ORAL_TABLET | ORAL | 0 refills | Status: DC

## 2018-08-24 MED ORDER — MIRTAZAPINE 30 MG PO TABS: 60.0000 mg | ORAL_TABLET | Freq: Every day | ORAL | 0 refills | Status: DC

## 2018-08-24 MED ORDER — CLONAZEPAM 1 MG PO TBDP: 1.0000 mg | ORAL_TABLET | Freq: Three times a day (TID) | ORAL | 2 refills | Status: DC | PRN

## 2018-08-24 MED ORDER — LITHIUM CARBONATE 600 MG PO CAPS: 600.0000 mg | ORAL_CAPSULE | Freq: Two times a day (BID) | ORAL | 0 refills | Status: DC

## 2018-08-24 NOTE — Progress Notes (Unsigned)
BH MD/PA/NP OP Progress Note  08/24/2018 2:46 PM Matthew Ortiz.  MRN:  [id number]  Chief Complaint:   HPI: Matthew Ortiz shared that his wife has been diagnosed with congestive heart failure.  She is having many panic attacks and this is causing him to also have panic attacks.  His wife is manipulative and there are times where he gets very frustrated.  He has made some new friends and is trying to be more social.  He experienced a severe panic attack on the anniversary of his trauma and it happened to be at the time he was having dinner with the friends.  He was very embarrassed by it.  Damari did end up sharing that he has a mental illness and states that it was excepted rather well.  He does continue to see them and enjoys it.  He still only gets about 5 hours of sleep.  He continues to experience PTSD symptoms.  He is taking his Klonopin about twice a day.  He asked that the Klonopin be changed to a dissolvable tablet.  He is denying any manic or hypomanic-like symptoms since her last visit.  He tells me that he is skin picking and it has been worse lately.  He has tried many coping mechanisms but nothing is really helped.  He has been going on for about 50 years now.  He is agreeable to trying a supplement to see if it may help with the skin picking  Visit Diagnosis:  No diagnosis found.    Past Psychiatric History:  Anxiety: Yes Bipolar Disorder: Yes Depression: Yes Mania: Yes Psychosis: No Schizophrenia: No Personality Disorder: No Hospitalization for psychiatric illness: Yes History of Electroconvulsive Shock Therapy: No Prior Suicide Attempts: Yes Previous meds: Seroquel, Abilify, Elavil, Wellbutrin, Ambien, Paxil, Effexor, Buspar   Past Medical History:  Past Medical History:  Diagnosis Date  . Anxiety   . Asthma   . COPD (chronic obstructive pulmonary disease) ([place])   . Depression   . Diabetes mellitus, type II ([place])   . GERD (gastroesophageal reflux disease)   . Gout    . HTN (hypertension)   . IBS (irritable bowel syndrome)   . Palpitations     Past Surgical History:  Procedure Laterality Date  . APPENDECTOMY    . FUNDOPLASTY TRANSTHORACIC    . HERNIA REPAIR    . RECONSTRUCTION OF NOSE      Family Psychiatric History:  Family History  Problem Relation Age of Onset  . Depression Mother   . Anxiety disorder Mother   . Schizophrenia Father   . Suicidality Father   . Suicidality Paternal Grandfather   . Drug abuse Paternal Grandfather   . Alcohol abuse Paternal Grandfather     Social History:  Social History   Socioeconomic History  . Marital status: Married    Spouse name: Not on file  . Number of children: Not on file  . Years of education: Not on file  . Highest education level: Not on file  Occupational History  . Not on file  Social Needs  . Financial resource strain: Not on file  . Food insecurity:    Worry: Not on file    Inability: Not on file  . Transportation needs:    Medical: Not on file    Non-medical: Not on file  Tobacco Use  . Smoking status: Former Smoker    Packs/day: 1.50    Years: 20.00    Pack years: 30.00    Last attempt  to quit: 05/03/1998    Years since quitting: 20.3  . Smokeless tobacco: Never Used  Substance and Sexual Activity  . Alcohol use: No    Comment: socially-small amount of wine monthly - 1 glass   . Drug use: No    Types: Marijuana    Comment: last times was over 1 yr ago  . Sexual activity: Never  Lifestyle  . Physical activity:    Days per week: Not on file    Minutes per session: Not on file  . Stress: Not on file  Relationships  . Social connections:    Talks on phone: Not on file    Gets together: Not on file    Attends religious service: Not on file    Active member of club or organization: Not on file    Attends meetings of clubs or organizations: Not on file    Relationship status: Not on file  Other Topics Concern  . Not on file  Social History Narrative  . Not on  file    Allergies:  Allergies  Allergen Reactions  . Bee Pollen   . Milk-Related Compounds Diarrhea  . Penicillins Rash    Metabolic Disorder Labs: Lab Results  Component Value Date   HGBA1C 5.5 10/31/2017   No results found for: PROLACTIN Lab Results  Component Value Date   CHOL 146 11/04/2017   TRIG 267 (H) 11/04/2017   HDL 38 (L) 11/04/2017   CHOLHDL 3.8 11/04/2017   LDLCALC 55 11/04/2017   Lab Results  Component Value Date   TSH 2.490 07/12/2017    Therapeutic Level Labs: No results found for: LITHIUM No results found for: VALPROATE No components found for:  CBMZ  Current Medications: Current Outpatient Medications  Medication Sig Dispense Refill  . Acetylcysteine (N-ACETYL-L-CYSTEINE) POWD 1 Units/day by Does not apply route daily. 1 Bottle 5  . albuterol (PROAIR HFA) 108 (90 Base) MCG/ACT inhaler Inhale 1-2 puffs into the lungs every 6 (six) hours as needed for wheezing or shortness of breath. 1 Inhaler 3  . atorvastatin (LIPITOR) 40 MG tablet Take 40 mg by mouth daily.    . benzonatate (TESSALON) 200 MG capsule Take 1 capsule (200 mg total) by mouth 3 (three) times daily as needed for cough. 30 capsule 1  . clonazepam (KLONOPIN) 1 MG disintegrating tablet Take 1 tablet (1 mg total) by mouth 3 (three) times daily as needed. 60 tablet 2  . escitalopram (LEXAPRO) 10 MG tablet Take 3 tablets (30 mg total) by mouth daily. 270 tablet 0  . Fish Oil-Cholecalciferol (FISH OIL + D3 PO) Take by mouth.    . fluticasone (FLONASE) 50 MCG/ACT nasal spray Place 1 spray into both nostrils daily. 16 g 5  . fluticasone (FLOVENT HFA) 110 MCG/ACT inhaler Inhale 2 puffs into the lungs 2 (two) times daily. 1 Inhaler 5  . indomethacin (INDOCIN) 25 MG capsule Take 1 capsule (25 mg total) by mouth 3 (three) times daily as needed. 30 capsule 0  . lithium 600 MG capsule Take 1 capsule (600 mg total) by mouth 2 (two) times daily with a meal. 180 capsule 0  . meloxicam (MOBIC) 15 MG tablet  Take 1 tablet (15 mg total) by mouth daily. 30 tablet 1  . metFORMIN (GLUCOPHAGE) 500 MG tablet Take 1 tablet (500 mg total) by mouth 2 (two) times daily with a meal. 180 tablet 3  . mirtazapine (REMERON) 30 MG tablet Take 2 tablets (60 mg total) by mouth at bedtime. Pastos  tablet 0  . omeprazole (PRILOSEC) 40 MG capsule Take 40 mg by mouth daily.    . tamsulosin (FLOMAX) 0.4 MG CAPS capsule Take 1 capsule (0.4 mg total) by mouth daily. 180 capsule 1  . traZODone (DESYREL) 100 MG tablet Take 1.5 tablets (150 mg total) by mouth at bedtime. May also take 1 tablet (100 mg total) at bedtime as needed for sleep (insomnia). 225 tablet 0   No current facility-administered medications for this visit.      Musculoskeletal: Strength & Muscle Tone: within normal limits Gait & Station: normal Patient leans: N/A  Psychiatric Specialty Exam: Review of Systems  Constitutional: Negative for chills, diaphoresis and fever.  Neurological: Positive for headaches. Negative for speech change and loss of consciousness.    There were no vitals taken for this visit.There is no height or weight on file to calculate BMI.  General Appearance: Fairly Groomed  Eye Contact:  Good  Speech:  Clear and Coherent and Normal Rate  Volume:  Normal  Mood:  Anxious and Depressed  Affect:  Full Range  Thought Process:  Goal Directed and Descriptions of Associations: Intact  Orientation:  Full (Time, Place, and Person)  Thought Content:  Logical  Suicidal Thoughts:  No  Homicidal Thoughts:  No  Memory:  Immediate;   Good  Judgement:  Good  Insight:  Good  Psychomotor Activity:  Normal  Concentration:  Concentration: Good  Recall:  Good  Fund of Knowledge:  Good  Language:  Good  Akathisia:  No  Handed:  Right  AIMS (if indicated):     Assets:  Communication Skills Desire for Improvement Financial Resources/Insurance [place] Talents/Skills Transportation  ADL's:  Intact  Cognition:   WNL  Sleep:            Screenings: PHQ2-9     Office Visit from 01/19/2018 in Primary Care at [facility] from 01/09/2018 in Primary Care at [facility] from 11/26/2017 in Primary Care at [facility] from 10/31/2017 in Primary Care at [facility] from 07/12/2017 in Primary Care at [facility] Total Score  0  0  0  0  4  PHQ-9 Total Score  -  -  -  -  17     I reviewed the information below on 06/22/2018 and have updated it Assessment and Plan: PTSD; Bipolar II-depressed; GAD; Skin picking disorder; Insomnia    Medication management with supportive therapy. Risks and benefits, side effects and alternative treatment options discussed with patient. Pt was given an opportunity to ask questions about medication, illness, and treatment. All current psychiatric medications have been reviewed and discussed with the patient and adjusted as clinically appropriate. The patient has been provided an accurate and updated list of the medications being now prescribed. Patient expressed understanding of how their medications were to be used.  Pt verbalized understanding and verbal consent obtained for treatment.  Status of current problems: ongoing  Meds: Lithium 600mg  po BID for Bipolar II Klonopin 1mg  po TID for anxiety Remeron 60mg  po qHS for anxiety and depression Lexapro 30mg  po qD for depression and anxiety- pt is aware the dose is higher than FDA recommended and denies SE. He is agreeable to dose  Trazodone 150mg  po qHS for insomnia and 100mg  po qHS prn middle of the night insomnia  N-acetylcysysteine 1 tab a day for skin picking  Labs: none  Therapy: brief supportive therapy provided. Discussed psychosocial stressors in detail.   Discussed  behavioral techniques to deal with skin picking   Consultations: Encouraged to follow up with therapist Encouraged to follow up with PCP as needed  Pt denies SI and is at an acute low risk for suicide. Patient told to call  clinic if any problems occur. Patient advised to go to ER if they should develop SI/HI, side effects, or if symptoms worsen. Has crisis numbers to call if needed. Pt verbalized understanding.  F/up in 2 months or sooner if needed  The duration of this appointment visit was 30 minutes of face-to-face time with the patient.  Greater than 50% of this time was spent in counseling, explanation of  diagnosis, planning of further management, and coordination of care   Charlcie Cradle, MD 08/24/2018, 2:46 PM

## 2018-08-28 DIAGNOSIS — F4310 Post-traumatic stress disorder, unspecified: Secondary | ICD-10-CM | POA: Diagnosis not present

## 2018-08-29 ENCOUNTER — Encounter: Payer: Self-pay | Admitting: Family Medicine

## 2018-08-31 DIAGNOSIS — F4310 Post-traumatic stress disorder, unspecified: Secondary | ICD-10-CM | POA: Diagnosis not present

## 2018-09-15 ENCOUNTER — Other Ambulatory Visit: Payer: Self-pay

## 2018-09-15 MED ORDER — ATORVASTATIN CALCIUM 40 MG PO TABS: 40.0000 mg | ORAL_TABLET | Freq: Every day | ORAL | 0 refills | Status: DC

## 2018-09-15 MED FILL — ATORVASTATIN 40 MG TABLET: 40 | 90 days supply | Qty: 90 | Fill #0

## 2018-09-27 NOTE — Telephone Encounter (Signed)
called pt LVM for him to call to see if he needed a TOC appt.foand  Refills

## 2018-10-19 ENCOUNTER — Encounter (HOSPITAL_COMMUNITY): Payer: Self-pay | Admitting: Psychiatry

## 2018-10-19 ENCOUNTER — Ambulatory Visit (INDEPENDENT_AMBULATORY_CARE_PROVIDER_SITE_OTHER): Payer: Medicare PPO | Admitting: Psychiatry

## 2018-10-19 DIAGNOSIS — F3181 Bipolar II disorder: Secondary | ICD-10-CM | POA: Diagnosis not present

## 2018-10-19 DIAGNOSIS — F4310 Post-traumatic stress disorder, unspecified: Secondary | ICD-10-CM

## 2018-10-19 DIAGNOSIS — F411 Generalized anxiety disorder: Secondary | ICD-10-CM

## 2018-10-19 DIAGNOSIS — F5105 Insomnia due to other mental disorder: Secondary | ICD-10-CM | POA: Diagnosis not present

## 2018-10-19 MED ORDER — LITHIUM CARBONATE 600 MG PO CAPS: 600.0000 mg | ORAL_CAPSULE | Freq: Two times a day (BID) | ORAL | 0 refills | Status: DC

## 2018-10-19 MED ORDER — TRAZODONE HCL 100 MG PO TABS: ORAL_TABLET | ORAL | 0 refills | Status: DC

## 2018-10-19 MED ORDER — MIRTAZAPINE 30 MG PO TABS: 60.0000 mg | ORAL_TABLET | Freq: Every day | ORAL | 0 refills | Status: DC

## 2018-10-19 MED ORDER — CLONAZEPAM 1 MG PO TBDP: 1.0000 mg | ORAL_TABLET | Freq: Three times a day (TID) | ORAL | 2 refills | Status: DC | PRN

## 2018-10-19 MED ORDER — PRAZOSIN HCL 1 MG PO CAPS: 1.0000 mg | ORAL_CAPSULE | Freq: Every day | ORAL | 0 refills | Status: DC
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
## 2018-10-26 ENCOUNTER — Telehealth: Payer: Self-pay

## 2018-10-26 NOTE — Telephone Encounter (Signed)
Questions for Screening COVID-19  Symptom onset: n/a  Travel or Contacts: no  During this illness, did/does the patient experience any of the following symptoms? Fever >100.37F []   Yes [x]   No []   Unknown Subjective fever (felt feverish) []   Yes [x]   No []   Unknown Chills []   Yes [x]   No []   Unknown Muscle aches (myalgia) []   Yes [x]   No []   Unknown Runny nose (rhinorrhea) []   Yes [x]   No []   Unknown Sore throat []   Yes [x]   No []   Unknown Cough (new onset or worsening of chronic cough) []   Yes [x]   No []   Unknown Shortness of breath (dyspnea) []   Yes []   No []   Unknown Nausea or vomiting []   Yes [x]   No []   Unknown Headache []   Yes [x]   No []   Unknown Abdominal pain  []   Yes [x]   No []   Unknown Diarrhea (?3 loose/looser than normal stools/24hr period) []   Yes [x]   No []   Unknown Other, specify:  Patient risk factors: Smoker? []   Current []   Former []   Never If male, currently pregnant? []   Yes []   No  Patient Active Problem List   Diagnosis Date Noted  . Cough 01/09/2018  . Lower resp. tract infection 01/09/2018  . Sinus congestion 01/09/2018  . Arthritis 12/22/2017  . Infertility male 12/22/2017  . Asthma 10/11/2017  . Chronic rhinitis 10/11/2017  . Psychophysiological insomnia 08/30/2017  . Major depressive disorder in partial remission ([place]) 08/30/2017  . Anxiety and depression 08/30/2017  . Chronically on benzodiazepine therapy 08/30/2017  . Hepatic steatosis 07/12/2017  . Vitamin D deficiency 07/12/2017  . Idiopathic hematuria 07/12/2017  . Essential hypertension 07/12/2017  . Hyperlipidemia LDL goal <100 07/12/2017  . Type 2 diabetes mellitus with complication, without long-term current use of insulin ([place]) 07/12/2017  . Diverticulitis of large intestine 01/29/2014  . PTSD (post-traumatic stress disorder) 01/22/2014  . Insomnia related to another mental disorder 01/22/2014  . GAD (generalized anxiety disorder) 01/22/2014  . Skin-picking disorder 01/22/2014  .  Bipolar 2 disorder, major depressive episode ([place]) 01/22/2014  . Benign prostatic hyperplasia 05/03/2011  . Hemorrhoids, internal 05/03/2011  . Male erectile disorder 05/03/2011  . Bipolar affective ([place]) 04/02/2011  . Diverticulitis 04/02/2011  . GERD (gastroesophageal reflux disease) 04/02/2011  . IBS (irritable bowel syndrome) 04/02/2011  . Osteoarthritis of shoulder 04/02/2011  . Snoring 04/02/2011  . Tachycardia 04/02/2011    Plan:  []   High risk for COVID-19 with red flags go to ED (with CP, SOB, weak/lightheaded, or fever > 101.5). Call ahead.  []   High risk for COVID-19 but stable. Inform provider and coordinate time for [facility] visit.   []   No red flags but URI signs or symptoms okay for [facility] visit.

## 2018-10-27 ENCOUNTER — Encounter: Payer: Self-pay | Admitting: Family Medicine

## 2018-10-27 ENCOUNTER — Ambulatory Visit (INDEPENDENT_AMBULATORY_CARE_PROVIDER_SITE_OTHER): Payer: Medicare PPO | Admitting: Family Medicine

## 2018-10-27 VITALS — BP 122/88 | HR 82 | Temp 97.8°F | Ht 72.0 in | Wt 180.4 lb

## 2018-10-27 DIAGNOSIS — E559 Vitamin D deficiency, unspecified: Secondary | ICD-10-CM

## 2018-10-27 DIAGNOSIS — E118 Type 2 diabetes mellitus with unspecified complications: Secondary | ICD-10-CM | POA: Diagnosis not present

## 2018-10-27 DIAGNOSIS — E785 Hyperlipidemia, unspecified: Secondary | ICD-10-CM | POA: Diagnosis not present

## 2018-10-27 DIAGNOSIS — I10 Essential (primary) hypertension: Secondary | ICD-10-CM

## 2018-10-27 LAB — VITAMIN D 25 HYDROXY (VIT D DEFICIENCY, FRACTURES): VITD: 34.76 ng/mL (ref 30.00–100.00)

## 2018-10-27 LAB — BASIC METABOLIC PANEL
BUN: 15 mg/dL (ref 6–23)
CO2: 26 mEq/L (ref 19–32)
Calcium: 9.7 mg/dL (ref 8.4–10.5)
Chloride: 105 mEq/L (ref 96–112)
Creatinine, Ser: 0.85 mg/dL (ref 0.40–1.50)
GFR: 92.12 mL/min (ref >60.00)
Glucose, Bld: 114 mg/dL — ABNORMAL HIGH (ref 70–99)
Potassium: 4.5 mEq/L (ref 3.5–5.1)
Sodium: 139 mEq/L (ref 135–145)

## 2018-10-27 LAB — LIPID PANEL
Cholesterol: 106 mg/dL (ref 0–200)
HDL: 36.7 mg/dL — ABNORMAL LOW (ref >39.00)
LDL Cholesterol: 29 mg/dL (ref 0–99)
NonHDL: 68.95
Total CHOL/HDL Ratio: 3
Triglycerides: 198 mg/dL — ABNORMAL HIGH (ref 0.0–149.0)
VLDL: 39.6 mg/dL (ref 0.0–40.0)

## 2018-10-27 LAB — ALT: ALT: 24 U/L (ref 0–53)

## 2018-10-27 LAB — MICROALBUMIN / CREATININE URINE RATIO
Creatinine,U: 154.4 mg/dL
Microalb Creat Ratio: 0.6 mg/g (ref 0.0–30.0)
Microalb, Ur: 0.9 mg/dL (ref 0.0–1.9)

## 2018-10-27 LAB — HEMOGLOBIN A1C: Hgb A1c MFr Bld: 5.5 % (ref 4.6–6.5)

## 2018-10-27 LAB — AST: AST: 13 U/L (ref 0–37)

## 2018-10-27 MED ORDER — MELOXICAM 15 MG PO TABS: 15.0000 mg | ORAL_TABLET | Freq: Every day | ORAL | 1 refills | Status: DC

## 2018-10-27 NOTE — Progress Notes (Signed)
Chief Complaint  Patient presents with  . Follow-up    f/u DM/ had to use albuterol last night had a choking spell   . Medication Refill    meloxicam    HPI: *Matthew Ortiz. is a 58 y.o. male here for follow-up on his chronic medical issues, specifically DM, HTN, Vit D deficiency, and hyperlipidemia. He is due for labs today.  No CP, palpitations, SOB, n/v/d/c, HA, dizziness.    Pt does check BS at home.  Hypoglycemia/Hypergylcemic episodes: no  Diet: average, pt states he cheats some days Exercise: walks 1-1.5 miles at least 3x/wk  Lab Results  Component Value Date   HGBA1C 5.5 10/31/2017   No results found for: Derl Barrow Lab Results  Component Value Date   CREATININE 0.89 10/31/2017   Lab Results  Component Value Date   CHOL 146 11/04/2017   HDL 38 (L) 11/04/2017   LDLCALC 55 11/04/2017   TRIG 267 (H) 11/04/2017   CHOLHDL 3.8 11/04/2017    The 10-year ASCVD risk score Mikey Bussing DC Jr., et al., 2013) is: 14.4%   Values used to calculate the score:     Age: 54 years     Sex: Male     Is Non-Hispanic African American: No     Diabetic: Yes     Tobacco smoker: No     Systolic Blood Pressure: 481 mmHg     Is BP treated: Yes     HDL Cholesterol: 38 mg/dL     Total Cholesterol: 146 mg/dL  Last foot exam:   Last eye exam: 08/2017 - has appt scheduled  Past Medical History:  Diagnosis Date  . Anxiety   . Asthma   . COPD (chronic obstructive pulmonary disease) ([place])   . Depression   . Diabetes mellitus, type II ([place])   . GERD (gastroesophageal reflux disease)   . Gout   . HTN (hypertension)   . IBS (irritable bowel syndrome)   . Palpitations     Past Surgical History:  Procedure Laterality Date  . APPENDECTOMY    . FUNDOPLASTY TRANSTHORACIC    . HERNIA REPAIR    . RECONSTRUCTION OF NOSE      Social History   Socioeconomic History  . Marital status: Married    Spouse name: Not on file  . Number of children: Not on file  . Years of  education: Not on file  . Highest education level: Not on file  Occupational History  . Not on file  Social Needs  . Financial resource strain: Not on file  . Food insecurity    Worry: Not on file    Inability: Not on file  . Transportation needs    Medical: Not on file    Non-medical: Not on file  Tobacco Use  . Smoking status: Former Smoker    Packs/day: 1.50    Years: 20.00    Pack years: 30.00    Quit date: 05/03/1998    Years since quitting: 20.4  . Smokeless tobacco: Never Used  Substance and Sexual Activity  . Alcohol use: No    Comment: socially-small amount of wine monthly - 1 glass   . Drug use: No    Types: Marijuana    Comment: last times was over 1 yr ago  . Sexual activity: Never  Lifestyle  . Physical activity    Days per week: Not on file    Minutes per session: Not on file  . Stress: Not on file  Relationships  . Social Herbalist on phone: Not on file    Gets together: Not on file    Attends religious service: Not on file    Active member of club or organization: Not on file    Attends meetings of clubs or organizations: Not on file    Relationship status: Not on file  . Intimate partner violence    Fear of current or ex partner: Not on file    Emotionally abused: Not on file    Physically abused: Not on file    Forced sexual activity: Not on file  Other Topics Concern  . Not on file  Social History Narrative  . Not on file    Family History  Problem Relation Age of Onset  . Depression Mother   . Anxiety disorder Mother   . Schizophrenia Father   . Suicidality Father   . Suicidality Paternal Grandfather   . Drug abuse Paternal Grandfather   . Alcohol abuse Paternal Grandfather      Immunization History  Administered Date(s) Administered  . Hepatitis B, ped/adol 05/04/1999  . Influenza Split 02/01/2011, 01/31/2017  . Pneumococcal Polysaccharide-23 07/12/2017  . Tdap 10/01/2009  . Zoster Recombinat (Shingrix) 03/03/2017,  06/03/2017    Outpatient Encounter Medications as of 10/27/2018  Medication Sig  . Acetylcysteine (N-ACETYL-L-CYSTEINE) POWD 1 Units/day by Does not apply route daily.  Marland Kitchen albuterol (PROAIR HFA) 108 (90 Base) MCG/ACT inhaler Inhale 1-2 puffs into the lungs every 6 (six) hours as needed for wheezing or shortness of breath.  Marland Kitchen atorvastatin (LIPITOR) 40 MG tablet Take 1 tablet (40 mg total) by mouth daily.  . benzonatate (TESSALON) 200 MG capsule Take 1 capsule (200 mg total) by mouth 3 (three) times daily as needed for cough.  . clonazePAM (KLONOPIN) 1 MG disintegrating tablet Take 1 tablet (1 mg total) by mouth 3 (three) times daily as needed.  Marland Kitchen escitalopram (LEXAPRO) 10 MG tablet Take 3 tablets (30 mg total) by mouth daily.  . Fish Oil-Cholecalciferol (FISH OIL + D3 PO) Take by mouth.  . fluticasone (FLONASE) 50 MCG/ACT nasal spray Place 1 spray into both nostrils daily.  . fluticasone (FLOVENT HFA) 110 MCG/ACT inhaler Inhale 2 puffs into the lungs 2 (two) times daily.  . indomethacin (INDOCIN) 25 MG capsule Take 1 capsule (25 mg total) by mouth 3 (three) times daily as needed.  . lithium 600 MG capsule Take 1 capsule (600 mg total) by mouth 2 (two) times daily with a meal.  . meloxicam (MOBIC) 15 MG tablet Take 1 tablet (15 mg total) by mouth daily.  . metFORMIN (GLUCOPHAGE) 500 MG tablet Take 1 tablet (500 mg total) by mouth 2 (two) times daily with a meal.  . mirtazapine (REMERON) 30 MG tablet Take 2 tablets (60 mg total) by mouth at bedtime.  Marland Kitchen omeprazole (PRILOSEC) 40 MG capsule Take 40 mg by mouth daily.  . prazosin (MINIPRESS) 1 MG capsule Take 1 capsule (1 mg total) by mouth at bedtime.  . tamsulosin (FLOMAX) 0.4 MG CAPS capsule Take 1 capsule (0.4 mg total) by mouth daily.  . traZODone (DESYREL) 100 MG tablet Take 1.5 tablets (150 mg total) by mouth at bedtime. May also take 1 tablet (100 mg total) at bedtime as needed for sleep (insomnia).  . [DISCONTINUED] meloxicam (MOBIC) 15 MG  tablet Take 1 tablet (15 mg total) by mouth daily.   No facility-administered encounter medications on file as of 10/27/2018.      ROS:  Gen: no fever, chills  Skin: no rash, itching ENT: no ear pain, ear drainage, nasal congestion, rhinorrhea, sinus pressure, sore throat Eyes: no blurry vision, double vision Resp: no cough, wheeze,SOB CV: no CP, palpitations, LE edema,  GI: + heartburn, no n/v/d/c, abd pain GU: no dysuria, + urgency and frequency, no hematuria - pt follows with urology MSK: no joint pain, myalgias, back pain Neuro: no dizziness, headache, weakness    Allergies  Allergen Reactions  . Bee Pollen   . Milk-Related Compounds Diarrhea  . Penicillins Rash    BP 122/88   Pulse 82   Temp 97.8 F (36.6 C) (Oral)   Ht 6' (1.829 m)   Wt 180 lb 6.4 oz (81.8 kg)   SpO2 96%   BMI 24.47 kg/m   BP Readings from Last 3 Encounters:  10/27/18 122/88  08/09/18 127/83  05/12/18 130/90   Pulse Readings from Last 3 Encounters:  10/27/18 82  05/12/18 85  04/15/18 72   Wt Readings from Last 3 Encounters:  10/27/18 180 lb 6.4 oz (81.8 kg)  01/19/18 172 lb 12.8 oz (78.4 kg)  01/09/18 171 lb 3.2 oz (77.7 kg)     Physical Exam  Constitutional: He is oriented to person, place, and time. He appears well-developed and well-nourished.  Neck: Neck supple.  Cardiovascular: Normal rate, regular rhythm, normal heart sounds and intact distal pulses.  Pulmonary/Chest: Effort normal and breath sounds normal. He has no wheezes. He has no rhonchi.  Abdominal: Soft. Bowel sounds are normal. He exhibits no distension. There is no abdominal tenderness.  Musculoskeletal:        General: No edema.  Lymphadenopathy:    He has no cervical adenopathy.  Neurological: He is alert and oriented to person, place, and time.  Skin: Skin is warm and dry.  Psychiatric: He has a normal mood and affect. His behavior is normal.     A/P:  1. Type 2 diabetes mellitus with complication, without  long-term current use of insulin (HCC) - cont current meds - eye exam scheduled - Basic metabolic panel - Lipid panel - Hemoglobin A1c - Microalbumin / creatinine urine ratio - f/u in 3 mo or sooner PRN  2. Essential hypertension - controlled, at goal - cont current med - Basic metabolic panel  3. Hyperlipidemia LDL goal <100 - cont lipitor - ALT - AST - Lipid panel  4. Vitamin D deficiency - VITAMIN D 25 Hydroxy (Vit-D Deficiency, Fractures)

## 2018-11-02 DIAGNOSIS — F4310 Post-traumatic stress disorder, unspecified: Secondary | ICD-10-CM | POA: Diagnosis not present

## 2018-11-06 DIAGNOSIS — F4310 Post-traumatic stress disorder, unspecified: Secondary | ICD-10-CM | POA: Diagnosis not present

## 2018-11-07 ENCOUNTER — Encounter: Payer: Self-pay | Admitting: Family Medicine

## 2018-11-13 DIAGNOSIS — F4310 Post-traumatic stress disorder, unspecified: Secondary | ICD-10-CM | POA: Diagnosis not present

## 2018-11-16 DIAGNOSIS — F4310 Post-traumatic stress disorder, unspecified: Secondary | ICD-10-CM | POA: Diagnosis not present

## 2018-11-20 DIAGNOSIS — F4310 Post-traumatic stress disorder, unspecified: Secondary | ICD-10-CM | POA: Diagnosis not present

## 2018-11-23 DIAGNOSIS — F4310 Post-traumatic stress disorder, unspecified: Secondary | ICD-10-CM | POA: Diagnosis not present

## 2018-11-27 DIAGNOSIS — F4310 Post-traumatic stress disorder, unspecified: Secondary | ICD-10-CM | POA: Diagnosis not present

## 2018-11-30 DIAGNOSIS — F4310 Post-traumatic stress disorder, unspecified: Secondary | ICD-10-CM | POA: Diagnosis not present

## 2018-12-02 DIAGNOSIS — R32 Unspecified urinary incontinence: Secondary | ICD-10-CM | POA: Insufficient documentation

## 2018-12-04 DIAGNOSIS — F4310 Post-traumatic stress disorder, unspecified: Secondary | ICD-10-CM | POA: Diagnosis not present

## 2018-12-07 ENCOUNTER — Ambulatory Visit (INDEPENDENT_AMBULATORY_CARE_PROVIDER_SITE_OTHER): Payer: Medicare PPO | Admitting: Psychiatry

## 2018-12-07 ENCOUNTER — Other Ambulatory Visit: Payer: Self-pay

## 2018-12-07 ENCOUNTER — Encounter (HOSPITAL_COMMUNITY): Payer: Self-pay | Admitting: Psychiatry

## 2018-12-07 DIAGNOSIS — F5105 Insomnia due to other mental disorder: Secondary | ICD-10-CM

## 2018-12-07 DIAGNOSIS — F4310 Post-traumatic stress disorder, unspecified: Secondary | ICD-10-CM | POA: Diagnosis not present

## 2018-12-07 DIAGNOSIS — F3181 Bipolar II disorder: Secondary | ICD-10-CM

## 2018-12-07 DIAGNOSIS — F424 Excoriation (skin-picking) disorder: Secondary | ICD-10-CM | POA: Diagnosis not present

## 2018-12-07 DIAGNOSIS — F411 Generalized anxiety disorder: Secondary | ICD-10-CM

## 2018-12-07 MED ORDER — TRAZODONE HCL 100 MG PO TABS: ORAL_TABLET | ORAL | 0 refills | Status: DC

## 2018-12-07 MED ORDER — LITHIUM CARBONATE 600 MG PO CAPS: 600.0000 mg | ORAL_CAPSULE | Freq: Two times a day (BID) | ORAL | 0 refills | Status: DC

## 2018-12-07 MED ORDER — MIRTAZAPINE 30 MG PO TABS: 60.0000 mg | ORAL_TABLET | Freq: Every day | ORAL | 0 refills | Status: DC

## 2018-12-07 MED ORDER — LATUDA 20 MG PO TABS: 20.0000 mg | ORAL_TABLET | Freq: Every evening | ORAL | 2 refills | Status: DC

## 2018-12-07 MED ORDER — CLONAZEPAM 1 MG PO TBDP: 1.0000 mg | ORAL_TABLET | Freq: Three times a day (TID) | ORAL | 2 refills | Status: DC | PRN

## 2018-12-07 MED ORDER — ESCITALOPRAM OXALATE 10 MG PO TABS: 30.0000 mg | ORAL_TABLET | Freq: Every day | ORAL | 0 refills | Status: DC

## 2018-12-07 MED ORDER — N-ACETYL-L-CYSTEINE POWD: 1.0000 [IU]/d | Freq: Every day | 11 refills | Status: DC

## 2018-12-07 NOTE — Progress Notes (Signed)
Virtual Visit via Telephone Note  I connected with Matthew Ortiz. on 12/07/18 at  1:00 PM EDT by telephone and verified that I am speaking with the correct person using two identifiers.  Location: Patient: home Provider: office   I discussed the limitations, risks, security and privacy concerns of performing an evaluation and management service by telephone and the availability of in person appointments. I also discussed with the patient that there may be a patient responsible charge related to this service. The patient expressed understanding and agreed to proceed.   History of Present Illness: Pt has been feeling good about his friends in [place]. He feels included and wanted. PTSD continues. He is having nightmares most nights even with Prazosin. Prazosin is causing lightheaded and dizzy. The anxiety of dealing with personal and wife's health stressors are taking a toll. As a result he having anger and more paranoia. When stressed he is skin picking and lately he has been doing it a lot. He denies SI/HI.    Observations/Objective: I spoke with Matthew Ortiz. on the phone.  Pt was calm, pleasant and cooperative.  Pt was engaged in the conversation and answered questions appropriately.  Speech was clear and coherent with normal rate, tone and volume.  Mood is depressed and anxious, affect is congruent but significantly brighter than previous visits. Thought processes are coherent, goal oriented and intact.  Thought content is logical.  Pt denies SI/HI.   Pt denies auditory and visual hallucinations and did not appear to be responding to internal stimuli.  Memory and concentration are good.  Fund of knowledge and use of language are average.  Insight and judgment are fair.  I am unable to comment on psychomotor activity, general appearance, hygiene, or eye contact as I was unable to physically see the patient on the phone.  Vital signs not available since interview conducted virtually.     Assessment and Plan: PTSD; Bipolar II- depressed; GAD; skin picking d/o; Insomnia  Start Latuda 20mg  po qHS D/c Prazosin Lithium 600mg  po BID Klonopin 1mg  po TID Remeron 60mg  po qHS Lexapro 30mg  po qD Trazodone 150mg  po qHS and 100mg  prn middle of the night insomnia N-acetylcysteneine 1 tab qd for skin picking    Follow Up Instructions: In 2 months or sooner if needed   I discussed the assessment and treatment plan with the patient. The patient was provided an opportunity to ask questions and all were answered. The patient agreed with the plan and demonstrated an understanding of the instructions.   The patient was advised to call back or seek an in-person evaluation if the symptoms worsen or if the condition fails to improve as anticipated.  I provided 25 minutes of non-face-to-face time during this encounter.   Charlcie Cradle, MD

## 2018-12-08 ENCOUNTER — Telehealth (HOSPITAL_COMMUNITY): Payer: Self-pay

## 2018-12-08 NOTE — Telephone Encounter (Signed)
Patient called regarding his Latuda 20mg  that was prescribed on 12/07/18. He stated that When he went to pick the medication up at the pharmacy they stated that it was a $400+ co-pay and he stated that he cannot afford that. Please review and advise. Thank you.

## 2018-12-11 DIAGNOSIS — H43813 Vitreous degeneration, bilateral: Secondary | ICD-10-CM | POA: Diagnosis not present

## 2018-12-11 DIAGNOSIS — H524 Presbyopia: Secondary | ICD-10-CM | POA: Diagnosis not present

## 2018-12-11 DIAGNOSIS — F4310 Post-traumatic stress disorder, unspecified: Secondary | ICD-10-CM | POA: Diagnosis not present

## 2018-12-11 DIAGNOSIS — H5213 Myopia, bilateral: Secondary | ICD-10-CM | POA: Diagnosis not present

## 2018-12-11 DIAGNOSIS — H25091 Other age-related incipient cataract, right eye: Secondary | ICD-10-CM | POA: Diagnosis not present

## 2018-12-11 DIAGNOSIS — H52223 Regular astigmatism, bilateral: Secondary | ICD-10-CM | POA: Diagnosis not present

## 2018-12-11 DIAGNOSIS — E119 Type 2 diabetes mellitus without complications: Secondary | ICD-10-CM | POA: Diagnosis not present

## 2018-12-14 DIAGNOSIS — F4310 Post-traumatic stress disorder, unspecified: Secondary | ICD-10-CM | POA: Diagnosis not present

## 2018-12-18 DIAGNOSIS — F4310 Post-traumatic stress disorder, unspecified: Secondary | ICD-10-CM | POA: Diagnosis not present

## 2018-12-20 NOTE — Telephone Encounter (Signed)
Per the documentation on 12/08/18, this patient has been looking at a $400+ copay for his Latuda 20mg  that he can't afford. Since then a tier reduction or a prior authorization was suggested and the pharmacy stated that the tier reduction would still be a high copay for the patient. Regan then gave me the number for patient assistance to see if the patient would qualify for that. I gave the phone number to the patient and he called. The patient stated that he heard back from [place] and they stated that the copay would still be over $300. He stated that he just couldn't get a break on it. It's looking like we can't get this medication down to a copay that the patient can afford. Are there possibly any alternatives? Please review and advise. Thank you.

## 2018-12-21 DIAGNOSIS — F4310 Post-traumatic stress disorder, unspecified: Secondary | ICD-10-CM | POA: Diagnosis not present

## 2018-12-25 DIAGNOSIS — F4310 Post-traumatic stress disorder, unspecified: Secondary | ICD-10-CM | POA: Diagnosis not present

## 2018-12-26 ENCOUNTER — Encounter: Payer: Self-pay | Admitting: Family Medicine

## 2018-12-26 ENCOUNTER — Other Ambulatory Visit: Payer: Self-pay | Admitting: Family Medicine

## 2018-12-26 MED FILL — ATORVASTATIN 40 MG TABLET: 40 | 90 days supply | Qty: 90 | Fill #0

## 2018-12-28 DIAGNOSIS — F4310 Post-traumatic stress disorder, unspecified: Secondary | ICD-10-CM | POA: Diagnosis not present

## 2018-12-28 NOTE — Telephone Encounter (Signed)
Do we have any samples? If not I will discuss with him at the next visit.

## 2018-12-29 NOTE — Telephone Encounter (Signed)
I have spoken with the patient. He is coming Monday to pick up the samples. Thank you.

## 2019-01-01 DIAGNOSIS — F4310 Post-traumatic stress disorder, unspecified: Secondary | ICD-10-CM | POA: Diagnosis not present

## 2019-01-04 DIAGNOSIS — F4310 Post-traumatic stress disorder, unspecified: Secondary | ICD-10-CM | POA: Diagnosis not present

## 2019-01-08 DIAGNOSIS — F4310 Post-traumatic stress disorder, unspecified: Secondary | ICD-10-CM | POA: Diagnosis not present

## 2019-01-09 ENCOUNTER — Encounter: Payer: Self-pay | Admitting: Family Medicine

## 2019-01-10 ENCOUNTER — Other Ambulatory Visit (HOSPITAL_COMMUNITY): Payer: Self-pay | Admitting: Psychiatry

## 2019-01-10 DIAGNOSIS — F4310 Post-traumatic stress disorder, unspecified: Secondary | ICD-10-CM

## 2019-01-11 DIAGNOSIS — F4310 Post-traumatic stress disorder, unspecified: Secondary | ICD-10-CM | POA: Diagnosis not present

## 2019-01-15 DIAGNOSIS — F4310 Post-traumatic stress disorder, unspecified: Secondary | ICD-10-CM | POA: Diagnosis not present

## 2019-01-18 DIAGNOSIS — F4310 Post-traumatic stress disorder, unspecified: Secondary | ICD-10-CM | POA: Diagnosis not present

## 2019-01-22 DIAGNOSIS — F4310 Post-traumatic stress disorder, unspecified: Secondary | ICD-10-CM | POA: Diagnosis not present

## 2019-01-25 DIAGNOSIS — F4310 Post-traumatic stress disorder, unspecified: Secondary | ICD-10-CM | POA: Diagnosis not present

## 2019-01-26 ENCOUNTER — Ambulatory Visit (INDEPENDENT_AMBULATORY_CARE_PROVIDER_SITE_OTHER): Payer: Medicare PPO | Admitting: Family Medicine

## 2019-01-26 ENCOUNTER — Other Ambulatory Visit: Payer: Self-pay

## 2019-01-26 ENCOUNTER — Encounter: Payer: Self-pay | Admitting: Family Medicine

## 2019-01-26 VITALS — BP 126/88 | HR 76 | Temp 98.1°F | Ht 72.0 in | Wt 180.4 lb

## 2019-01-26 DIAGNOSIS — E118 Type 2 diabetes mellitus with unspecified complications: Secondary | ICD-10-CM

## 2019-01-26 DIAGNOSIS — I10 Essential (primary) hypertension: Secondary | ICD-10-CM | POA: Diagnosis not present

## 2019-01-26 DIAGNOSIS — E785 Hyperlipidemia, unspecified: Secondary | ICD-10-CM | POA: Diagnosis not present

## 2019-01-26 LAB — HEMOGLOBIN A1C: Hgb A1c MFr Bld: 5.5 % (ref 4.6–6.5)

## 2019-01-26 NOTE — Progress Notes (Signed)
Matthew Ortiz. is a 58 y.o. male  Chief Complaint  Patient presents with  . Follow-up    f/u 3 mo DM/ wants you to look at a scab on top of head     HPI: Matthew Ortiz. is a 58 y.o. male here for f/u on his DM, HTN, hyperlipidemia. Last labs in 10/2018. Last A1C = 5.5.  Pt states he is doing well and has no complaints. He had a tooth pulled last week.  Pt is walking 5 miles per day, gets 12,000 steps per day. He has started taking weekly painting lessons and really enjoys it.   Pt states he has had scab on his head since summer and would me to look at it. Not painful, itchy. No bleeding. No change in size or color.   Past Medical History:  Diagnosis Date  . Anxiety   . Asthma   . COPD (chronic obstructive pulmonary disease) (Matthew Ortiz)   . Depression   . Diabetes mellitus, type II (Matthew Ortiz)   . GERD (gastroesophageal reflux disease)   . Gout   . HTN (hypertension)   . IBS (irritable bowel syndrome)   . Palpitations     Past Surgical History:  Procedure Laterality Date  . APPENDECTOMY    . FUNDOPLASTY TRANSTHORACIC    . HERNIA REPAIR    . RECONSTRUCTION OF NOSE      Social History   Socioeconomic History  . Marital status: Married    Spouse name: Not on file  . Number of children: Not on file  . Years of education: Not on file  . Highest education level: Not on file  Occupational History  . Not on file  Social Needs  . Financial resource strain: Not on file  . Food insecurity    Worry: Not on file    Inability: Not on file  . Transportation needs    Medical: Not on file    Non-medical: Not on file  Tobacco Use  . Smoking status: Former Smoker    Packs/day: 1.50    Years: 20.00    Pack years: 30.00    Quit date: 05/03/1998    Years since quitting: 20.7  . Smokeless tobacco: Never Used  Substance and Sexual Activity  . Alcohol use: No    Comment: socially-small amount of wine monthly - 1 glass   . Drug use: No    Types: Marijuana    Comment: last times  was over 1 yr ago  . Sexual activity: Never  Lifestyle  . Physical activity    Days per week: Not on file    Minutes per session: Not on file  . Stress: Not on file  Relationships  . Social Herbalist on phone: Not on file    Gets together: Not on file    Attends religious service: Not on file    Active member of club or organization: Not on file    Attends meetings of clubs or organizations: Not on file    Relationship status: Not on file  . Intimate partner violence    Fear of current or ex partner: Not on file    Emotionally abused: Not on file    Physically abused: Not on file    Forced sexual activity: Not on file  Other Topics Concern  . Not on file  Social History Narrative  . Not on file    Family History  Problem Relation Age of Onset  . Depression  Mother   . Anxiety disorder Mother   . Schizophrenia Father   . Suicidality Father   . Suicidality Paternal Grandfather   . Drug abuse Paternal Grandfather   . Alcohol abuse Paternal Grandfather      Immunization History  Administered Date(s) Administered  . Hepatitis B, ped/adol 05/04/1999  . Influenza Split 02/01/2011, 01/31/2017  . Influenza,inj,Quad PF,6+ Mos 12/29/2018  . Pneumococcal Polysaccharide-23 07/12/2017  . Tdap 10/01/2009  . Zoster Recombinat (Shingrix) 03/03/2017, 06/03/2017    Outpatient Encounter Medications as of 01/26/2019  Medication Sig  . Acetylcysteine (N-ACETYL-L-CYSTEINE) POWD 1 Units/day by Does not apply route daily.  Marland Kitchen albuterol (PROAIR HFA) 108 (90 Base) MCG/ACT inhaler Inhale 1-2 puffs into the lungs every 6 (six) hours as needed for wheezing or shortness of breath.  Marland Kitchen atorvastatin (LIPITOR) 40 MG tablet TAKE 1 TABLET BY MOUTH DAILY.  . clonazePAM (KLONOPIN) 1 MG disintegrating tablet Take 1 tablet (1 mg total) by mouth 3 (three) times daily as needed.  Marland Kitchen escitalopram (LEXAPRO) 10 MG tablet Take 3 tablets (30 mg total) by mouth daily.  . Fish Oil-Cholecalciferol (FISH  OIL + D3 PO) Take by mouth.  . fluticasone (FLONASE) 50 MCG/ACT nasal spray Place 1 spray into both nostrils daily.  . fluticasone (FLOVENT HFA) 110 MCG/ACT inhaler Inhale 2 puffs into the lungs 2 (two) times daily.  . indomethacin (INDOCIN) 25 MG capsule Take 1 capsule (25 mg total) by mouth 3 (three) times daily as needed.  . lithium 600 MG capsule Take 1 capsule (600 mg total) by mouth 2 (two) times daily with a meal.  . lurasidone (LATUDA) 20 MG TABS tablet Take 1 tablet (20 mg total) by mouth every evening.  . meloxicam (MOBIC) 15 MG tablet Take 1 tablet (15 mg total) by mouth daily.  . metFORMIN (GLUCOPHAGE) 500 MG tablet Take 1 tablet (500 mg total) by mouth 2 (two) times daily with a meal.  . omeprazole (PRILOSEC) 40 MG capsule Take 40 mg by mouth daily.  . tamsulosin (FLOMAX) 0.4 MG CAPS capsule Take 1 capsule (0.4 mg total) by mouth daily.  . traZODone (DESYREL) 100 MG tablet Take 1.5 tablets (150 mg total) by mouth at bedtime. May also take 1 tablet (100 mg total) at bedtime as needed for sleep (insomnia).  . mirtazapine (REMERON) 30 MG tablet Take 2 tablets (60 mg total) by mouth at bedtime. (Patient not taking: Reported on 01/26/2019)  . [DISCONTINUED] benzonatate (TESSALON) 200 MG capsule Take 1 capsule (200 mg total) by mouth 3 (three) times daily as needed for cough.   No facility-administered encounter medications on file as of 01/26/2019.      ROS: Gen: no fever, chills  Skin: no rash, itching ENT: no ear pain, ear drainage, nasal congestion, rhinorrhea, sinus pressure, sore throat Eyes: no blurry vision, double vision Resp: no cough, wheeze,SOB CV: no CP, palpitations, LE edema,  GI: no heartburn, n/v/d/c, abd pain GU: no dysuria, urgency, frequency, hematuria  MSK: no joint pain, myalgias, back pain Neuro: no dizziness, headache, weakness, vertigo Psych: no depression, anxiety, insomnia   Allergies  Allergen Reactions  . Bee Pollen   . Milk-Related Compounds  Diarrhea  . Penicillins Rash    BP 126/88   Pulse 76   Temp 98.1 F (36.7 C) (Oral)   Ht 6' (1.829 m)   Wt 180 lb 6.4 oz (81.8 kg)   SpO2 97%   BMI 24.47 kg/m   Physical Exam  Constitutional: He is oriented to person, place,  and time. He appears well-developed and well-nourished. No distress.  Cardiovascular: Normal rate, regular rhythm and normal heart sounds.  Pulmonary/Chest: Effort normal and breath sounds normal. No respiratory distress.  Musculoskeletal:        General: No edema.  Neurological: He is alert and oriented to person, place, and time.  Psychiatric: He has a normal mood and affect. His behavior is normal.     A/P:  1. Type 2 diabetes mellitus with complication, without long-term current use of insulin (HCC) - Hemoglobin A1c - well-controlled, cont current meds - continue walking daily and overall healthy diet - f/u 6 mo  2. Essential hypertension - well-controlled, at goal - f/u in 6 mo  3. Hyperlipidemia LDL goal <100 - last FLP in 10/2018 - cont statin - f/u in 6 mo

## 2019-01-29 DIAGNOSIS — F4310 Post-traumatic stress disorder, unspecified: Secondary | ICD-10-CM | POA: Diagnosis not present

## 2019-02-05 DIAGNOSIS — F4310 Post-traumatic stress disorder, unspecified: Secondary | ICD-10-CM | POA: Diagnosis not present

## 2019-02-05 DIAGNOSIS — R3912 Poor urinary stream: Secondary | ICD-10-CM | POA: Diagnosis not present

## 2019-02-05 DIAGNOSIS — N401 Enlarged prostate with lower urinary tract symptoms: Secondary | ICD-10-CM | POA: Diagnosis not present

## 2019-02-05 DIAGNOSIS — R350 Frequency of micturition: Secondary | ICD-10-CM | POA: Diagnosis not present

## 2019-02-08 ENCOUNTER — Encounter (HOSPITAL_COMMUNITY): Payer: Self-pay | Admitting: Psychiatry

## 2019-02-08 ENCOUNTER — Other Ambulatory Visit: Payer: Self-pay

## 2019-02-08 ENCOUNTER — Ambulatory Visit (INDEPENDENT_AMBULATORY_CARE_PROVIDER_SITE_OTHER): Payer: Medicare PPO | Admitting: Psychiatry

## 2019-02-08 DIAGNOSIS — F411 Generalized anxiety disorder: Secondary | ICD-10-CM

## 2019-02-08 DIAGNOSIS — F3181 Bipolar II disorder: Secondary | ICD-10-CM | POA: Diagnosis not present

## 2019-02-08 DIAGNOSIS — F4310 Post-traumatic stress disorder, unspecified: Secondary | ICD-10-CM

## 2019-02-08 DIAGNOSIS — F424 Excoriation (skin-picking) disorder: Secondary | ICD-10-CM | POA: Diagnosis not present

## 2019-02-08 DIAGNOSIS — F5105 Insomnia due to other mental disorder: Secondary | ICD-10-CM | POA: Diagnosis not present

## 2019-02-08 DIAGNOSIS — N401 Enlarged prostate with lower urinary tract symptoms: Secondary | ICD-10-CM | POA: Diagnosis not present

## 2019-02-08 DIAGNOSIS — R350 Frequency of micturition: Secondary | ICD-10-CM | POA: Diagnosis not present

## 2019-02-08 DIAGNOSIS — R3912 Poor urinary stream: Secondary | ICD-10-CM | POA: Diagnosis not present

## 2019-02-08 MED ORDER — TRAZODONE HCL 100 MG PO TABS: ORAL_TABLET | ORAL | 0 refills | Status: DC

## 2019-02-08 MED ORDER — LITHIUM CARBONATE 600 MG PO CAPS: 600.0000 mg | ORAL_CAPSULE | Freq: Two times a day (BID) | ORAL | 0 refills | Status: DC

## 2019-02-08 MED ORDER — ESCITALOPRAM OXALATE 10 MG PO TABS: 30.0000 mg | ORAL_TABLET | Freq: Every day | ORAL | 0 refills | Status: DC

## 2019-02-08 MED ORDER — CLONAZEPAM 1 MG PO TBDP: 1.0000 mg | ORAL_TABLET | Freq: Three times a day (TID) | ORAL | 2 refills | Status: DC | PRN

## 2019-02-08 MED ORDER — MIRTAZAPINE 30 MG PO TABS: 60.0000 mg | ORAL_TABLET | Freq: Every day | ORAL | 0 refills | Status: DC

## 2019-02-08 MED ORDER — LATUDA 20 MG PO TABS: 20.0000 mg | ORAL_TABLET | Freq: Every evening | ORAL | 0 refills | Status: DC

## 2019-02-08 NOTE — Progress Notes (Signed)
Virtual Visit via Telephone Note  I connected with Matthew Ortiz.  on 02/08/19 at  4:00 PM EDT by telephone and verified that I am speaking with the correct person using two identifiers.  Location: Patient: friend's house Provider: office   I discussed the limitations, risks, security and privacy concerns of performing an evaluation and management service by telephone and the availability of in person appointments. I also discussed with the patient that there may be a patient responsible charge related to this service. The patient expressed understanding and agreed to proceed.   History of Present Illness: Pt had prostate surgery earlier this week. He is currently staying with a friend who is taking care of him during his recovery. It has been a gratifying experience and he doesn't know how to thank them. He is doing well. [name] had set up a system of people to call and come by to help when she needs it. He is going to help her to get tested for Dementia. His anxiety has been up due to surgery and recovery. He has had worsening in skin picking. That is on par for his usual reaction to stress. He continues to have nightmares.   He did not take Latuda for 3 weeks due to fear of SE. He finally started taking it and notes no SE. He is pleasantly surprised. It has been helping with mood stability. He does not want to stop taking and wants to see what further effect it will have.  His depression has "really good". This week he has been in good spirits. Sleep is good. He denies SI/HI.     Observations/Objective: I spoke with Matthew Ortiz. on the phone.  Pt was calm, pleasant and cooperative.  Pt was engaged in the conversation and answered questions appropriately.  Speech was clear and coherent with normal rate, tone and volume.  Mood is mildly depressed and anxious, affect is congruent. Thought processes are coherent and circumstantial.  Thought content is logical.  Pt denies SI/HI.   Pt  denies auditory and visual hallucinations and did not appear to be responding to internal stimuli.  Memory and concentration are good.  Fund of knowledge and use of language are average.  Insight and judgment are fair.  I am unable to comment on psychomotor activity, general appearance, hygiene, or eye contact as I was unable to physically see the patient on the phone.  Vital signs not available since interview conducted virtually.     Assessment and Plan: PTSD; Bipolar II- depressed; GAD; Skin picking d/o; Insomnia  Status of current symptoms: overall improved  Continue: -Latuda 20mg  po qHS -Lithium 600mg  po BID -Klonopin 1mg  po TID -Remeron 60mg  po qHS -Lexapro 30mg  po qD- pt is aware this is above the FDA recommended dose. He is reporting benefit and denies SE. He would like to continue taking it. -Trazodone 150mg  po qHS and 100mg  prn middle of the night insomnia - N- acetylcysteine 1 tab qD for skin picking    Follow Up Instructions: In 8 weeks or sooner if needed   I discussed the assessment and treatment plan with the patient. The patient was provided an opportunity to ask questions and all were answered. The patient agreed with the plan and demonstrated an understanding of the instructions.   The patient was advised to call back or seek an in-person evaluation if the symptoms worsen or if the condition fails to improve as anticipated.  I provided 40 minutes of non-face-to-face time during this  encounter.   Charlcie Cradle, MD

## 2019-02-12 DIAGNOSIS — F4310 Post-traumatic stress disorder, unspecified: Secondary | ICD-10-CM | POA: Diagnosis not present

## 2019-02-15 DIAGNOSIS — F4310 Post-traumatic stress disorder, unspecified: Secondary | ICD-10-CM | POA: Diagnosis not present

## 2019-02-19 DIAGNOSIS — F4310 Post-traumatic stress disorder, unspecified: Secondary | ICD-10-CM | POA: Diagnosis not present

## 2019-02-22 DIAGNOSIS — F4310 Post-traumatic stress disorder, unspecified: Secondary | ICD-10-CM | POA: Diagnosis not present

## 2019-02-26 DIAGNOSIS — F4310 Post-traumatic stress disorder, unspecified: Secondary | ICD-10-CM | POA: Diagnosis not present

## 2019-02-27 NOTE — Progress Notes (Signed)
Virtual Visit via Video Note  I connected with patient on 02/28/19 at 10:00 AM EDT by audio enabled telemedicine application and verified that I am speaking with the correct person using two identifiers.   THIS ENCOUNTER IS A VIRTUAL VISIT DUE TO COVID-19 - PATIENT WAS NOT SEEN IN THE OFFICE. PATIENT HAS CONSENTED TO VIRTUAL VISIT / TELEMEDICINE VISIT   Location of patient: home  Location of provider: office  I discussed the limitations of evaluation and management by telemedicine and the availability of in person appointments. The patient expressed understanding and agreed to proceed.   Subjective:   Matthew Ortiz. is a 58 y.o. male who presents for an Initial Medicare Annual Wellness Visit.  Review of Systems  Home Safety/Smoke Alarms: Feels safe in home. Smoke alarms in place.  Lives with wife in one story house. 2 dogs. No issues w/ stairs.   Male:   CCS- pt states he is due next June.    PSA- No results found for: PSA     Objective:    There were no vitals filed for this visit. Unable to assess. This visit is enabled though telemedicine due to Covid 19.  Advanced Directives 02/28/2019  Does Patient Have a Medical Advance Directive? No  Would patient like information on creating a medical advance directive? No - Patient declined  Some encounter information is confidential and restricted. Go to Review Flowsheets activity to see all data.    Current Medications (verified) Outpatient Encounter Medications as of 02/28/2019  Medication Sig  . Acetylcysteine (N-ACETYL-L-CYSTEINE) POWD 1 Units/day by Does not apply route daily.  Marland Kitchen albuterol (PROAIR HFA) 108 (90 Base) MCG/ACT inhaler Inhale 1-2 puffs into the lungs every 6 (six) hours as needed for wheezing or shortness of breath.  Marland Kitchen atorvastatin (LIPITOR) 40 MG tablet TAKE 1 TABLET BY MOUTH DAILY.  . clonazePAM (KLONOPIN) 1 MG disintegrating tablet Take 1 tablet (1 mg total) by mouth 3 (three) times daily as needed.  Marland Kitchen  escitalopram (LEXAPRO) 10 MG tablet Take 3 tablets (30 mg total) by mouth daily.  . Fish Oil-Cholecalciferol (FISH OIL + D3 PO) Take by mouth.  . fluticasone (FLONASE) 50 MCG/ACT nasal spray Place 1 spray into both nostrils daily.  . fluticasone (FLOVENT HFA) 110 MCG/ACT inhaler Inhale 2 puffs into the lungs 2 (two) times daily.  . indomethacin (INDOCIN) 25 MG capsule Take 1 capsule (25 mg total) by mouth 3 (three) times daily as needed.  . lithium 600 MG capsule Take 1 capsule (600 mg total) by mouth 2 (two) times daily with a meal.  . lurasidone (LATUDA) 20 MG TABS tablet Take 1 tablet (20 mg total) by mouth every evening.  . meloxicam (MOBIC) 15 MG tablet Take 1 tablet (15 mg total) by mouth daily.  . metFORMIN (GLUCOPHAGE) 500 MG tablet Take 1 tablet (500 mg total) by mouth 2 (two) times daily with a meal.  . mirtazapine (REMERON) 30 MG tablet Take 2 tablets (60 mg total) by mouth at bedtime.  Marland Kitchen omeprazole (PRILOSEC) 40 MG capsule Take 40 mg by mouth daily.  . tamsulosin (FLOMAX) 0.4 MG CAPS capsule Take 1 capsule (0.4 mg total) by mouth daily.  . traZODone (DESYREL) 100 MG tablet Take 1.5 tablets (150 mg total) by mouth at bedtime. May also take 1 tablet (100 mg total) at bedtime as needed for sleep (insomnia).   No facility-administered encounter medications on file as of 02/28/2019.     Allergies (verified) Bee pollen, Milk-related compounds, and Penicillins  History: Past Medical History:  Diagnosis Date  . Anxiety   . Asthma   . COPD (chronic obstructive pulmonary disease) ([place])   . Depression   . Diabetes mellitus, type II ([place])   . GERD (gastroesophageal reflux disease)   . Gout   . HTN (hypertension)   . IBS (irritable bowel syndrome)   . Palpitations    Past Surgical History:  Procedure Laterality Date  . APPENDECTOMY    . FUNDOPLASTY TRANSTHORACIC    . HERNIA REPAIR    . RECONSTRUCTION OF NOSE     Family History  Problem Relation Age of Onset  . Depression  Mother   . Anxiety disorder Mother   . Schizophrenia Father   . Suicidality Father   . Suicidality Paternal Grandfather   . Drug abuse Paternal Grandfather   . Alcohol abuse Paternal Grandfather    Social History   Socioeconomic History  . Marital status: Married    Spouse name: Not on file  . Number of children: Not on file  . Years of education: Not on file  . Highest education level: Not on file  Occupational History  . Not on file  Social Needs  . Financial resource strain: Not on file  . Food insecurity    Worry: Not on file    Inability: Not on file  . Transportation needs    Medical: Not on file    Non-medical: Not on file  Tobacco Use  . Smoking status: Former Smoker    Packs/day: 1.50    Years: 20.00    Pack years: 30.00    Quit date: 05/03/1998    Years since quitting: 20.8  . Smokeless tobacco: Never Used  Substance and Sexual Activity  . Alcohol use: No    Comment: socially-small amount of wine monthly - 1 glass   . Drug use: No    Types: Marijuana    Comment: last times was over 1 yr ago  . Sexual activity: Never  Lifestyle  . Physical activity    Days per week: Not on file    Minutes per session: Not on file  . Stress: Not on file  Relationships  . Social Herbalist on phone: Not on file    Gets together: Not on file    Attends religious service: Not on file    Active member of club or organization: Not on file    Attends meetings of clubs or organizations: Not on file    Relationship status: Not on file  Other Topics Concern  . Not on file  Social History Narrative  . Not on file   Tobacco Counseling Counseling given: Not Answered   Clinical Intake:  Pain : No/denies pain    Activities of Daily Living In your present state of health, do you have any difficulty performing the following activities: 02/28/2019  Hearing? N  Vision? N  Difficulty concentrating or making decisions? N  Walking or climbing stairs? N  Dressing or  bathing? N  Doing errands, shopping? N  Preparing Food and eating ? N  Using the Toilet? N  In the past six months, have you accidently leaked urine? N  Do you have problems with loss of bowel control? N  Managing your Medications? N  Managing your Finances? N  Housekeeping or managing your Housekeeping? N  Some recent data might be hidden     Immunizations and Health Maintenance Immunization History  Administered Date(s) Administered  . Hepatitis B,  ped/adol 05/04/1999  . Influenza Split 02/01/2011, 01/31/2017  . Influenza,inj,Quad PF,6+ Mos 12/29/2018  . Pneumococcal Polysaccharide-23 07/12/2017  . Tdap 10/01/2009  . Zoster Recombinat (Shingrix) 03/03/2017, 06/03/2017   Health Maintenance Due  Topic Date Due  . Hepatitis C Screening  [birth year]  . FOOT EXAM  07/13/2018  . OPHTHALMOLOGY EXAM  08/02/2018    Patient Care Team: [name], DO as PCP - General (Family Medicine)  Indicate any recent Medical Services you may have received from other than [facility] providers in the past year (date may be approximate).    Assessment:   This is a routine wellness examination for [place]. Physical assessment deferred to PCP.  Hearing/Vision screen Unable to assess. This visit is enabled though telemedicine due to Covid 19.   Dietary issues and exercise activities discussed: Current Exercise Habits: Home exercise routine, Type of exercise: walking, Time (Minutes): 45, Frequency (Times/Week): 4, Weekly Exercise (Minutes/Week): 180, Intensity: Mild, Exercise limited by: None identified Diet (meal preparation, eat out, water intake, caffeinated beverages, dairy products, fruits and vegetables): well balanced   Goals    . Patient Stated     Lose weight by cutting out carbs in the evening.      Depression Screen PHQ 2/9 Scores 02/28/2019 01/19/2018 01/09/2018 11/26/2017  PHQ - 2 Score 0 0 0 0  PHQ- 9 Score - - - -    Fall Risk Fall Risk  02/28/2019 01/19/2018 01/09/2018  11/26/2017 10/31/2017  Falls in the past year? 1 No No No No  Number falls in past yr: 1 - - - -  Injury with Fall? 0 - - - -  Follow up Education provided;Falls prevention discussed - - - -    Cognitive Function: Ad8 score reviewed for issues:  Issues making decisions:no  Less interest in hobbies / activities:no  Repeats questions, stories (family complaining):no  Trouble using ordinary gadgets (microwave, computer, phone):no  Forgets the month or year: no  Mismanaging finances: no  Remembering appts:no  Daily problems with thinking and/or memory:no Ad8 score is=0         Screening Tests Health Maintenance  Topic Date Due  . Hepatitis C Screening  13-Jun-1960  . FOOT EXAM  07/13/2018  . OPHTHALMOLOGY EXAM  08/02/2018  . COLONOSCOPY  05/04/2019  . HEMOGLOBIN A1C  07/26/2019  . TETANUS/TDAP  10/02/2019  . URINE MICROALBUMIN  10/27/2019  . INFLUENZA VACCINE  Completed  . PNEUMOCOCCAL POLYSACCHARIDE VACCINE AGE 36-64 HIGH RISK  Completed  . HIV Screening  Completed       Plan:   See you next year!  Continue to eat heart healthy diet (full of fruits, vegetables, whole grains, lean protein, water--limit salt, fat, and sugar intake) and increase physical activity as tolerated.  Continue doing brain stimulating activities (puzzles, reading, adult coloring books, staying active) to keep memory sharp.   Bring a copy of your living will and/or healthcare power of attorney to your next office visit.    I have personally reviewed and noted the following in the patient's chart:   . Medical and social history . Use of alcohol, tobacco or illicit drugs  . Current medications and supplements . Functional ability and status . Nutritional status . Physical activity . Advanced directives . List of other physicians . Hospitalizations, surgeries, and ER visits in previous 12 months . Vitals . Screenings to include cognitive, depression, and falls . Referrals and  appointments  In addition, I have reviewed and discussed with patient certain preventive protocols,  quality metrics, and best practice recommendations. A written personalized care plan for preventive services as well as general preventive health recommendations were provided to patient.     Shela Nevin, [place]   02/28/2019

## 2019-02-28 ENCOUNTER — Encounter: Payer: Self-pay | Admitting: *Deleted

## 2019-02-28 ENCOUNTER — Ambulatory Visit (INDEPENDENT_AMBULATORY_CARE_PROVIDER_SITE_OTHER): Payer: Medicare PPO | Admitting: *Deleted

## 2019-02-28 ENCOUNTER — Other Ambulatory Visit: Payer: Self-pay

## 2019-02-28 DIAGNOSIS — Z0000 Encounter for general adult medical examination without abnormal findings: Secondary | ICD-10-CM

## 2019-02-28 NOTE — Patient Instructions (Signed)
See you next year!  Continue to eat heart healthy diet (full of fruits, vegetables, whole grains, lean protein, water--limit salt, fat, and sugar intake) and increase physical activity as tolerated.  Continue doing brain stimulating activities (puzzles, reading, adult coloring books, staying active) to keep memory sharp.   Bring a copy of your living will and/or healthcare power of attorney to your next office visit.   Matthew Ortiz , Thank you for taking time to come for your Medicare Wellness Visit. I appreciate your ongoing commitment to your health goals. Please review the following plan we discussed and let me know if I can assist you in the future.   These are the goals we discussed: Goals    . Patient Stated     Lose weight by cutting out carbs in the evening.       This is a list of the screening recommended for you and due dates:  Health Maintenance  Topic Date Due  .  Hepatitis C: One time screening is recommended by Center for Disease Control  (CDC) for  adults born from [birth year] through 1966.   1961-04-19  . Complete foot exam   07/13/2018  . Eye exam for diabetics  08/02/2018  . Colon Cancer Screening  05/04/2019  . Hemoglobin A1C  07/26/2019  . Tetanus Vaccine  10/02/2019  . Urine Protein Check  10/27/2019  . Flu Shot  Completed  . Pneumococcal vaccine  Completed  . HIV Screening  Completed    Preventive Care 21-21 Years Old, Male Preventive care refers to lifestyle choices and visits with your health care provider that can promote health and wellness. This includes:  A yearly physical exam. This is also called an annual well check.  Regular dental and eye exams.  Immunizations.  Screening for certain conditions.  Healthy lifestyle choices, such as eating a healthy diet, getting regular exercise, not using drugs or products that contain nicotine and tobacco, and limiting alcohol use. What can I expect for my preventive care visit? Physical exam Your health care  provider will check:  Height and weight. These may be used to calculate body mass index (BMI), which is a measurement that tells if you are at a healthy weight.  Heart rate and blood pressure.  Your skin for abnormal spots. Counseling Your health care provider may ask you questions about:  Alcohol, tobacco, and drug use.  Emotional well-being.  Home and relationship well-being.  Sexual activity.  Eating habits.  Work and work Statistician. What immunizations do I need?  Influenza (flu) vaccine  This is recommended every year. Tetanus, diphtheria, and pertussis (Tdap) vaccine  You may need a Td booster every 10 years. Varicella (chickenpox) vaccine  You may need this vaccine if you have not already been vaccinated. Zoster (shingles) vaccine  You may need this after age 29. Measles, mumps, and rubella (MMR) vaccine  You may need at least one dose of MMR if you were born in 1957 or later. You may also need a second dose. Pneumococcal conjugate (PCV13) vaccine  You may need this if you have certain conditions and were not previously vaccinated. Pneumococcal polysaccharide (PPSV23) vaccine  You may need one or two doses if you smoke cigarettes or if you have certain conditions. Meningococcal conjugate (MenACWY) vaccine  You may need this if you have certain conditions. Hepatitis A vaccine  You may need this if you have certain conditions or if you travel or work in places where you may be exposed to  hepatitis A. Hepatitis B vaccine  You may need this if you have certain conditions or if you travel or work in places where you may be exposed to hepatitis B. Haemophilus influenzae type b (Hib) vaccine  You may need this if you have certain risk factors. Human papillomavirus (HPV) vaccine  If recommended by your health care provider, you may need three doses over 6 months. You may receive vaccines as individual doses or as more than one vaccine together in one shot  (combination vaccines). Talk with your health care provider about the risks and benefits of combination vaccines. What tests do I need? Blood tests  Lipid and cholesterol levels. These may be checked every 5 years, or more frequently if you are over 30 years old.  Hepatitis C test.  Hepatitis B test. Screening  Lung cancer screening. You may have this screening every year starting at age 62 if you have a 30-pack-year history of smoking and currently smoke or have quit within the past 15 years.  Prostate cancer screening. Recommendations will vary depending on your family history and other risks.  Colorectal cancer screening. All adults should have this screening starting at age 85 and continuing until age 20. Your health care provider may recommend screening at age 3 if you are at increased risk. You will have tests every 1-10 years, depending on your results and the type of screening test.  Diabetes screening. This is done by checking your blood sugar (glucose) after you have not eaten for a while (fasting). You may have this done every 1-3 years.  Sexually transmitted disease (STD) testing. Follow these instructions at home: Eating and drinking  Eat a diet that includes fresh fruits and vegetables, whole grains, lean protein, and low-fat dairy products.  Take vitamin and mineral supplements as recommended by your health care provider.  Do not drink alcohol if your health care provider tells you not to drink.  If you drink alcohol: ? Limit how much you have to 0-2 drinks a day. ? Be aware of how much alcohol is in your drink. In the [place] one drink equals one 12 oz bottle of beer (355 mL), one 5 oz glass of wine (148 mL), or one 1 oz glass of hard liquor (44 mL). Lifestyle  Take daily care of your teeth and gums.  Stay active. Exercise for at least 30 minutes on 5 or more days each week.  Do not use any products that contain nicotine or tobacco, such as cigarettes,  e-cigarettes, and chewing tobacco. If you need help quitting, ask your health care provider.  If you are sexually active, practice safe sex. Use a condom or other form of protection to prevent STIs (sexually transmitted infections).  Talk with your health care provider about taking a low-dose aspirin every day starting at age 63. What's next?  Go to your health care provider once a year for a well check visit.  Ask your health care provider how often you should have your eyes and teeth checked.  Stay up to date on all vaccines. This information is not intended to replace advice given to you by your health care provider. Make sure you discuss any questions you have with your health care provider. Document Released: 05/16/2015 Document Revised: 04/13/2018 Document Reviewed: 04/13/2018 Elsevier Patient Education  2020 [employer].

## 2019-03-01 DIAGNOSIS — F4310 Post-traumatic stress disorder, unspecified: Secondary | ICD-10-CM | POA: Diagnosis not present

## 2019-03-05 DIAGNOSIS — F4310 Post-traumatic stress disorder, unspecified: Secondary | ICD-10-CM | POA: Diagnosis not present

## 2019-03-08 DIAGNOSIS — F4310 Post-traumatic stress disorder, unspecified: Secondary | ICD-10-CM | POA: Diagnosis not present

## 2019-03-10 ENCOUNTER — Encounter: Payer: Self-pay | Admitting: Family Medicine

## 2019-03-12 DIAGNOSIS — F4310 Post-traumatic stress disorder, unspecified: Secondary | ICD-10-CM | POA: Diagnosis not present

## 2019-03-12 MED ORDER — GLUCOSE BLOOD VI STRP: ORAL_STRIP | 3 refills | Status: DC

## 2019-03-12 MED ORDER — TRUEPLUS LANCETS 30G MISC: 3 refills | Status: AC

## 2019-03-15 DIAGNOSIS — F4310 Post-traumatic stress disorder, unspecified: Secondary | ICD-10-CM | POA: Diagnosis not present

## 2019-03-16 ENCOUNTER — Encounter: Payer: Self-pay | Admitting: Family Medicine

## 2019-03-19 DIAGNOSIS — F4310 Post-traumatic stress disorder, unspecified: Secondary | ICD-10-CM | POA: Diagnosis not present

## 2019-03-21 ENCOUNTER — Encounter: Payer: Self-pay | Admitting: Family Medicine

## 2019-03-21 ENCOUNTER — Ambulatory Visit (INDEPENDENT_AMBULATORY_CARE_PROVIDER_SITE_OTHER): Payer: Medicare PPO | Admitting: Family Medicine

## 2019-03-21 DIAGNOSIS — R17 Unspecified jaundice: Secondary | ICD-10-CM

## 2019-03-21 DIAGNOSIS — N400 Enlarged prostate without lower urinary tract symptoms: Secondary | ICD-10-CM

## 2019-03-21 DIAGNOSIS — E118 Type 2 diabetes mellitus with unspecified complications: Secondary | ICD-10-CM

## 2019-03-21 MED ORDER — ATORVASTATIN CALCIUM 10 MG PO TABS: 10.0000 mg | ORAL_TABLET | Freq: Every day | ORAL | 3 refills | Status: DC

## 2019-03-21 NOTE — Progress Notes (Signed)
Virtual Visit via Video Note  I connected with Matthew Ortiz. on 03/21/19 at 10:30 AM EST by a video enabled telemedicine application and verified that I am speaking with the correct person using two identifiers. Location patient: home Location provider: work  Persons participating in the virtual visit: patient, provider  I discussed the limitations of evaluation and management by telemedicine and the availability of in person appointments. The patient expressed understanding and agreed to proceed.  Chief Complaint  Patient presents with  . Follow-up     HPI: Matthew Ortiz. is a 58 y.o. male here to f/u on recent labs done at the [place] with his new PCP there. Lipid panel shows Tchol = 91, LDL = 20, TG = 140. Pt is on lipitor 40mg  daily. We will decrease to 10mg  daily. Pt is walking 3 miles per day 4-5x/wk.  Total bili = 2.1 (previously 1.8 in 7/19, 1.5 in 5/19) with normal con bili = 0.3 and AST, ALT WNL. Pt is scheduled for US liver thru [facility] in the coming weeks. PSA = 5.28 (1.1 in 7/19). Pt follows with urology Dr. Aleene Davidson and had Rezum procedure for BPH done about 6 wks ago.  Pt states he is also scheduled for a "screening echo" and had home sleep study done last week. He will send results once available.    Past Medical History:  Diagnosis Date  . Anxiety   . Asthma   . COPD (chronic obstructive pulmonary disease) ([place])   . Depression   . Diabetes mellitus, type II ([place])   . GERD (gastroesophageal reflux disease)   . Gout   . HTN (hypertension)   . IBS (irritable bowel syndrome)   . Palpitations     Past Surgical History:  Procedure Laterality Date  . APPENDECTOMY    . FUNDOPLASTY TRANSTHORACIC    . HERNIA REPAIR    . RECONSTRUCTION OF NOSE      Family History  Problem Relation Age of Onset  . Depression Mother   . Anxiety disorder Mother   . Schizophrenia Father   . Suicidality Father   . Suicidality Paternal Grandfather   . Drug abuse Paternal Grandfather    . Alcohol abuse Paternal Grandfather     Social History   Tobacco Use  . Smoking status: Former Smoker    Packs/day: 1.50    Years: 20.00    Pack years: 30.00    Quit date: 05/03/1998    Years since quitting: 20.8  . Smokeless tobacco: Never Used  Substance Use Topics  . Alcohol use: No    Comment: socially-small amount of wine monthly - 1 glass   . Drug use: No    Types: Marijuana    Comment: last times was over 1 yr ago     Current Outpatient Medications:  .  Acetylcysteine (N-ACETYL-L-CYSTEINE) POWD, 1 Units/day by Does not apply route daily., Disp: 30 g, Rfl: 11 .  albuterol (PROAIR HFA) 108 (90 Base) MCG/ACT inhaler, Inhale 1-2 puffs into the lungs every 6 (six) hours as needed for wheezing or shortness of breath., Disp: 1 Inhaler, Rfl: 3 .  atorvastatin (LIPITOR) 40 MG tablet, TAKE 1 TABLET BY MOUTH DAILY., Disp: 90 tablet, Rfl: 0 .  clonazePAM (KLONOPIN) 1 MG disintegrating tablet, Take 1 tablet (1 mg total) by mouth 3 (three) times daily as needed., Disp: 60 tablet, Rfl: 2 .  escitalopram (LEXAPRO) 10 MG tablet, Take 3 tablets (30 mg total) by mouth daily., Disp: 270 tablet, Rfl: 0 .  Fish Oil-Cholecalciferol (FISH OIL + D3 PO), Take by mouth., Disp: , Rfl:  .  fluticasone (FLONASE) 50 MCG/ACT nasal spray, Place 1 spray into both nostrils daily., Disp: 16 g, Rfl: 5 .  fluticasone (FLOVENT HFA) 110 MCG/ACT inhaler, Inhale 2 puffs into the lungs 2 (two) times daily., Disp: 1 Inhaler, Rfl: 5 .  glucose blood test strip, Use to test blood sugars 2 times daily., Disp: 200 each, Rfl: 3 .  indomethacin (INDOCIN) 25 MG capsule, Take 1 capsule (25 mg total) by mouth 3 (three) times daily as needed., Disp: 30 capsule, Rfl: 0 .  lithium 600 MG capsule, Take 1 capsule (600 mg total) by mouth 2 (two) times daily with a meal., Disp: 180 capsule, Rfl: 0 .  lurasidone (LATUDA) 20 MG TABS tablet, Take 1 tablet (20 mg total) by mouth every evening., Disp: 90 tablet, Rfl: 0 .  meloxicam  (MOBIC) 15 MG tablet, Take 1 tablet (15 mg total) by mouth daily., Disp: 90 tablet, Rfl: 1 .  metFORMIN (GLUCOPHAGE) 500 MG tablet, Take 1 tablet (500 mg total) by mouth 2 (two) times daily with a meal., Disp: 180 tablet, Rfl: 3 .  mirtazapine (REMERON) 30 MG tablet, Take 2 tablets (60 mg total) by mouth at bedtime., Disp: 180 tablet, Rfl: 0 .  omeprazole (PRILOSEC) 40 MG capsule, Take 40 mg by mouth daily., Disp: , Rfl:  .  tamsulosin (FLOMAX) 0.4 MG CAPS capsule, Take 1 capsule (0.4 mg total) by mouth daily., Disp: 180 capsule, Rfl: 1 .  traZODone (DESYREL) 100 MG tablet, Take 1.5 tablets (150 mg total) by mouth at bedtime. May also take 1 tablet (100 mg total) at bedtime as needed for sleep (insomnia)., Disp: 225 tablet, Rfl: 0 .  TRUEplus Lancets 30G MISC, Use to test blood sugars 2 times daily., Disp: 200 each, Rfl: 3  Allergies  Allergen Reactions  . Bee Pollen   . Milk-Related Compounds Diarrhea  . Penicillins Rash      ROS: See pertinent positives and negatives per HPI.   EXAM:  VITALS per patient if applicable: There were no vitals taken for this visit.   GENERAL: alert, oriented, appears well and in no acute distress  HEENT: atraumatic, conjunctiva clear, no obvious abnormalities on inspection of external nose and ears  NECK: normal movements of the head and neck  LUNGS: on inspection no signs of respiratory distress, breathing rate appears normal, no obvious gross SOB, gasping or wheezing, no conversational dyspnea  CV: no obvious cyanosis  PSYCH/NEURO: pleasant and cooperative, speech and thought processing grossly intact   ASSESSMENT AND PLAN:  1. Type 2 diabetes mellitus with complication, without long-term current use of insulin (HCC) - well-controlled Decrease: - atorvastatin (LIPITOR) 10 MG tablet; Take 1 tablet (10 mg total) by mouth daily.  Dispense: 90 tablet; Refill: 3 - decreased from 40mg  daily to 10mg  daily - recheck FLP in 6-12 mo  2. Benign  prostatic hyperplasia, unspecified whether lower urinary tract symptoms present - recent Rezum procedure so it is unsurprising that PSA is elevated. This is likely d/t body's inflammatory response after procedure which can take 2-4 mo to resolve. Will plan to recheck PSA in 2-38mo from now, pt would like to wait 6-12 mo but is agreeable to sooner.  3. Serum total bilirubin elevated - chronic x few years, normal direct bili, AST, ALT. Pt is asymptomatic, no jaundice or scleral icterus - [facility] ? Gilbert syndrome - if liver  US normal, I do not think further work-up/eval is needed at this time other than annual hepatic function panel   I discussed the assessment and treatment plan with the patient. The patient was provided an opportunity to ask questions and all were answered. The patient agreed with the plan and demonstrated an understanding of the instructions.   The patient was advised to call back or seek an in-person evaluation if the symptoms worsen or if the condition fails to improve as anticipated.   Letta Median, DO

## 2019-03-26 DIAGNOSIS — F4310 Post-traumatic stress disorder, unspecified: Secondary | ICD-10-CM | POA: Diagnosis not present

## 2019-04-02 DIAGNOSIS — F4310 Post-traumatic stress disorder, unspecified: Secondary | ICD-10-CM | POA: Diagnosis not present

## 2019-04-09 DIAGNOSIS — F4310 Post-traumatic stress disorder, unspecified: Secondary | ICD-10-CM | POA: Diagnosis not present

## 2019-04-12 DIAGNOSIS — F4310 Post-traumatic stress disorder, unspecified: Secondary | ICD-10-CM | POA: Diagnosis not present

## 2019-04-16 ENCOUNTER — Encounter: Payer: Self-pay | Admitting: Family Medicine

## 2019-04-16 DIAGNOSIS — F4310 Post-traumatic stress disorder, unspecified: Secondary | ICD-10-CM | POA: Diagnosis not present

## 2019-04-17 ENCOUNTER — Other Ambulatory Visit: Payer: Self-pay | Admitting: Family Medicine

## 2019-04-19 ENCOUNTER — Other Ambulatory Visit: Payer: Self-pay

## 2019-04-19 ENCOUNTER — Ambulatory Visit (INDEPENDENT_AMBULATORY_CARE_PROVIDER_SITE_OTHER): Payer: Medicare PPO | Admitting: Psychiatry

## 2019-04-19 ENCOUNTER — Encounter (HOSPITAL_COMMUNITY): Payer: Self-pay | Admitting: Psychiatry

## 2019-04-19 DIAGNOSIS — F5105 Insomnia due to other mental disorder: Secondary | ICD-10-CM | POA: Diagnosis not present

## 2019-04-19 DIAGNOSIS — F424 Excoriation (skin-picking) disorder: Secondary | ICD-10-CM

## 2019-04-19 DIAGNOSIS — F4310 Post-traumatic stress disorder, unspecified: Secondary | ICD-10-CM | POA: Diagnosis not present

## 2019-04-19 DIAGNOSIS — F3181 Bipolar II disorder: Secondary | ICD-10-CM | POA: Diagnosis not present

## 2019-04-19 DIAGNOSIS — F411 Generalized anxiety disorder: Secondary | ICD-10-CM

## 2019-04-19 MED ORDER — LITHIUM CARBONATE 600 MG PO CAPS: 600.0000 mg | ORAL_CAPSULE | Freq: Two times a day (BID) | ORAL | 0 refills | Status: DC

## 2019-04-19 MED ORDER — ESCITALOPRAM OXALATE 10 MG PO TABS: 30.0000 mg | ORAL_TABLET | Freq: Every day | ORAL | 0 refills | Status: DC

## 2019-04-19 MED ORDER — LATUDA 20 MG PO TABS: 20.0000 mg | ORAL_TABLET | Freq: Every evening | ORAL | 0 refills | Status: DC

## 2019-04-19 MED ORDER — CLONAZEPAM 1 MG PO TBDP: 1.0000 mg | ORAL_TABLET | Freq: Three times a day (TID) | ORAL | 2 refills | Status: DC | PRN

## 2019-04-19 MED ORDER — TRAZODONE HCL 100 MG PO TABS: ORAL_TABLET | ORAL | 0 refills | Status: DC

## 2019-04-19 MED ORDER — MIRTAZAPINE 30 MG PO TABS: 60.0000 mg | ORAL_TABLET | Freq: Every day | ORAL | 0 refills | Status: DC

## 2019-04-19 NOTE — Progress Notes (Signed)
  Virtual Visit via Telephone Note  I connected with Matthew Ortiz.  on 04/19/19 at  4:30 PM EST by telephone and verified that I am speaking with the correct person using two identifiers.  Location: Patient: home Provider: office   I discussed the limitations, risks, security and privacy concerns of performing an evaluation and management service by telephone and the availability of in person appointments. I also discussed with the patient that there may be a patient responsible charge related to this service. The patient expressed understanding and agreed to proceed.   History of Present Illness: Matthew Ortiz reports that his depression and PTSD are overall unchanged. He continues to have issues with his neighbor. Matthew Ortiz and his wife have decided to move and are looking for homes in [place]. It is scary and exciting. Matthew Ortiz is working with a Forensic psychologist. His depression is ongoing. The holidays are always hard and are associated with his father's death. Matthew Ortiz is happy to have some nice friends. He feels very supported and is grateful. PTSD is ongoing. HV is always present. He is having severe, vivid nightmares. Medications do not help and he is working on it in therapy. Matthew Ortiz has stopped skin picking and is happy with how his hands look now! He is denying SI/HI. Matthew Ortiz has ongoing health and relationship stressors. He denies any manic or hypomanic like symptoms.     Observations/Objective: I spoke with Matthew Ortiz. on the phone.  Pt was calm, pleasant and cooperative.  Pt was engaged in the conversation and answered questions appropriately.  Speech was clear and coherent with normal rate, tone and volume.  Mood is depressed and anxious, affect is congruent. Thought processes are coherent and circumstantial.  Thought content is with ruminations.  Pt denies SI/HI.   Pt denies auditory and visual hallucinations and did not appear to be responding to internal stimuli.  Memory and  concentration are good.  Fund of knowledge and use of language are average.  Insight and judgment are fair.  I am unable to comment on psychomotor activity, general appearance, hygiene, or eye contact as I was unable to physically see the patient on the phone.  Vital signs not available since interview conducted virtually.    I reviewed the information below on 04/19/2019 and have updated it Assessment and Plan: PTSD; Bipolar II- depressed; GAD; Skin picking d/o; Insomnia  Status of current symptoms: ongoing PTSD and depression; skin picking is much better   Continue: -Latuda 20mg  po qHS -Lithium 600mg  po BID -Klonopin 1mg  po TID -Remeron 60mg  po qHS -Lexapro 30mg  po qD- pt is aware this is above the FDA recommended dose. He is reporting benefit and denies SE. He would like to continue taking it. -Trazodone 150mg  po qHS and 100mg  prn middle of the night insomnia - N- acetylcysteine 1 tab qD for skin picking     Follow Up Instructions: In 8 weeks or sooner if needed   I discussed the assessment and treatment plan with the patient. The patient was provided an opportunity to ask questions and all were answered. The patient agreed with the plan and demonstrated an understanding of the instructions.   The patient was advised to call back or seek an in-person evaluation if the symptoms worsen or if the condition fails to improve as anticipated.  I provided 40 minutes of non-face-to-face time during this encounter.   Charlcie Cradle, MD

## 2019-04-26 DIAGNOSIS — F4310 Post-traumatic stress disorder, unspecified: Secondary | ICD-10-CM | POA: Diagnosis not present

## 2019-04-30 DIAGNOSIS — F4310 Post-traumatic stress disorder, unspecified: Secondary | ICD-10-CM | POA: Diagnosis not present

## 2019-05-03 DIAGNOSIS — F4310 Post-traumatic stress disorder, unspecified: Secondary | ICD-10-CM | POA: Diagnosis not present

## 2019-05-07 DIAGNOSIS — F4310 Post-traumatic stress disorder, unspecified: Secondary | ICD-10-CM | POA: Diagnosis not present

## 2019-05-08 DIAGNOSIS — R972 Elevated prostate specific antigen [PSA]: Secondary | ICD-10-CM | POA: Diagnosis not present

## 2019-05-10 ENCOUNTER — Other Ambulatory Visit (HOSPITAL_COMMUNITY): Payer: Self-pay | Admitting: Psychiatry

## 2019-05-10 DIAGNOSIS — F5105 Insomnia due to other mental disorder: Secondary | ICD-10-CM

## 2019-05-10 DIAGNOSIS — F4310 Post-traumatic stress disorder, unspecified: Secondary | ICD-10-CM | POA: Diagnosis not present

## 2019-05-14 DIAGNOSIS — F4310 Post-traumatic stress disorder, unspecified: Secondary | ICD-10-CM | POA: Diagnosis not present

## 2019-05-17 DIAGNOSIS — F4310 Post-traumatic stress disorder, unspecified: Secondary | ICD-10-CM | POA: Diagnosis not present

## 2019-05-21 DIAGNOSIS — F4310 Post-traumatic stress disorder, unspecified: Secondary | ICD-10-CM | POA: Diagnosis not present

## 2019-05-24 DIAGNOSIS — F4310 Post-traumatic stress disorder, unspecified: Secondary | ICD-10-CM | POA: Diagnosis not present

## 2019-05-28 ENCOUNTER — Telehealth (HOSPITAL_COMMUNITY): Payer: Self-pay

## 2019-05-28 DIAGNOSIS — F4310 Post-traumatic stress disorder, unspecified: Secondary | ICD-10-CM | POA: Diagnosis not present

## 2019-05-28 NOTE — Telephone Encounter (Signed)
Received a fax from [place] stating that they have approved coverage for a 30 day supply of Latuda 20mg  under the patient's Medicare Part D benefit. The problem with this is that it still costs the patient $475 (even with the insurance coverage per the pharmacy) for him to get his Latuda. I'll keep supplying his samples (he just picked some up today) until something can be figured out. Please review and advise. Thank you.

## 2019-05-31 DIAGNOSIS — F4310 Post-traumatic stress disorder, unspecified: Secondary | ICD-10-CM | POA: Diagnosis not present

## 2019-06-04 DIAGNOSIS — F4310 Post-traumatic stress disorder, unspecified: Secondary | ICD-10-CM | POA: Diagnosis not present

## 2019-06-07 DIAGNOSIS — F4310 Post-traumatic stress disorder, unspecified: Secondary | ICD-10-CM | POA: Diagnosis not present

## 2019-06-07 NOTE — Telephone Encounter (Signed)
Ok thanks. I will discuss with him at his next appt

## 2019-06-11 DIAGNOSIS — F4310 Post-traumatic stress disorder, unspecified: Secondary | ICD-10-CM | POA: Diagnosis not present

## 2019-06-14 DIAGNOSIS — F4310 Post-traumatic stress disorder, unspecified: Secondary | ICD-10-CM | POA: Diagnosis not present

## 2019-06-18 DIAGNOSIS — F4310 Post-traumatic stress disorder, unspecified: Secondary | ICD-10-CM | POA: Diagnosis not present

## 2019-06-19 NOTE — Telephone Encounter (Signed)
Had notified patient 

## 2019-06-21 ENCOUNTER — Ambulatory Visit (INDEPENDENT_AMBULATORY_CARE_PROVIDER_SITE_OTHER): Payer: Medicare PPO | Admitting: Psychiatry

## 2019-06-21 ENCOUNTER — Other Ambulatory Visit: Payer: Self-pay

## 2019-06-21 ENCOUNTER — Encounter (HOSPITAL_COMMUNITY): Payer: Self-pay | Admitting: Psychiatry

## 2019-06-21 DIAGNOSIS — F5105 Insomnia due to other mental disorder: Secondary | ICD-10-CM | POA: Diagnosis not present

## 2019-06-21 DIAGNOSIS — F3181 Bipolar II disorder: Secondary | ICD-10-CM

## 2019-06-21 DIAGNOSIS — F4310 Post-traumatic stress disorder, unspecified: Secondary | ICD-10-CM | POA: Diagnosis not present

## 2019-06-21 DIAGNOSIS — F411 Generalized anxiety disorder: Secondary | ICD-10-CM | POA: Diagnosis not present

## 2019-06-21 DIAGNOSIS — F424 Excoriation (skin-picking) disorder: Secondary | ICD-10-CM

## 2019-06-21 MED ORDER — LATUDA 20 MG PO TABS: 20.0000 mg | ORAL_TABLET | Freq: Every evening | ORAL | 0 refills | Status: DC

## 2019-06-21 MED ORDER — LITHIUM CARBONATE 600 MG PO CAPS: 600.0000 mg | ORAL_CAPSULE | Freq: Two times a day (BID) | ORAL | 0 refills | Status: DC

## 2019-06-21 MED ORDER — MIRTAZAPINE 30 MG PO TABS: 60.0000 mg | ORAL_TABLET | Freq: Every day | ORAL | 0 refills | Status: DC

## 2019-06-21 MED ORDER — CLONAZEPAM 1 MG PO TBDP: 1.0000 mg | ORAL_TABLET | Freq: Three times a day (TID) | ORAL | 2 refills | Status: DC | PRN

## 2019-06-21 MED ORDER — ESCITALOPRAM OXALATE 10 MG PO TABS: 30.0000 mg | ORAL_TABLET | Freq: Every day | ORAL | 0 refills | Status: DC

## 2019-06-21 MED ORDER — TRAZODONE HCL 100 MG PO TABS: ORAL_TABLET | ORAL | 0 refills | Status: DC

## 2019-06-21 NOTE — Progress Notes (Signed)
Virtual Visit via Telephone Note  I connected with Matthew Ortiz. on 06/21/19 at  4:00 PM EST by telephone and verified that I am speaking with the correct person using two identifiers.  Location: Patient: home Provider: office   I discussed the limitations, risks, security and privacy concerns of performing an evaluation and management service by telephone and the availability of in person appointments. I also discussed with the patient that there may be a patient responsible charge related to this service. The patient expressed understanding and agreed to proceed.   History of Present Illness: Matthew Ortiz had been wanting to move to [place] but recently has putt he search on hold. One factor is not finding the right house and another was that a close friend in [place] is moving out of state. He spent the majority of the appointment talking about this friend. He was depressed on learning that she was moving. Matthew Ortiz is no longer skin picking and is very happy. He has ongoing PTSD and it is overall unchanged. Matthew Ortiz does feel that the PTSD is not as much of an issue as his friend moving right now. He denies SI/HI.    Observations/Objective: General Appearance: unable to assess  Eye Contact:  unable to assess  Speech:  Clear and Coherent and Normal Rate  Volume:  Normal  Mood:  Anxious and Depressed  Affect:  Full Range  Thought Process:  Coherent and Descriptions of Associations: Circumstantial  Orientation:  Full (Time, Place, and Person)  Thought Content:  Logical  Suicidal Thoughts:  No  Homicidal Thoughts:  No  Memory:  Immediate;   Good  Judgement:  Good  Insight:  Good  Psychomotor Activity:  unable to leave  Concentration:  Concentration: Good  Recall:  Good  Fund of Knowledge:  Good  Language:  Good  Akathisia:  unable to assess  Handed:  Right  AIMS (if indicated):     Assets:  Communication Skills Desire for Improvement Financial  Resources/Insurance Housing Leisure Time Social Support Talents/Skills Transportation Vocational/Educational  ADL's:  Unable to assess  Cognition:  WNL  Sleep:         I reviewed the information below on 06/21/19 and have updated it Assessment and Plan: PTSD; Bipolar II- depressed; GAD; Skin picking d/o; Insomnia   Status of current symptoms: ongoing but manageable   Continue: -Latuda 20mg  po qHS -Lithium 600mg  po BID -Klonopin 1mg  po TID -Remeron 60mg  po qHS -Lexapro 30mg  po qD- pt is aware this is above the FDA recommended dose. He is reporting benefit and denies SE. He would like to continue taking it. -Trazodone 150mg  po qHS and 100mg  prn middle of the night insomnia - N- acetylcysteine 1 tab qD for skin picking     Follow Up Instructions: In 2-3 months or sooner if needed   I discussed the assessment and treatment plan with the patient. The patient was provided an opportunity to ask questions and all were answered. The patient agreed with the plan and demonstrated an understanding of the instructions.   The patient was advised to call back or seek an in-person evaluation if the symptoms worsen or if the condition fails to improve as anticipated.  I provided 35 minutes of non-face-to-face time during this encounter.   Charlcie Cradle, MD

## 2019-06-25 DIAGNOSIS — F4310 Post-traumatic stress disorder, unspecified: Secondary | ICD-10-CM | POA: Diagnosis not present

## 2019-06-28 DIAGNOSIS — F4310 Post-traumatic stress disorder, unspecified: Secondary | ICD-10-CM | POA: Diagnosis not present

## 2019-07-02 DIAGNOSIS — F4310 Post-traumatic stress disorder, unspecified: Secondary | ICD-10-CM | POA: Diagnosis not present

## 2019-07-05 DIAGNOSIS — F4310 Post-traumatic stress disorder, unspecified: Secondary | ICD-10-CM | POA: Diagnosis not present

## 2019-07-12 DIAGNOSIS — F4310 Post-traumatic stress disorder, unspecified: Secondary | ICD-10-CM | POA: Diagnosis not present

## 2019-07-16 DIAGNOSIS — F4310 Post-traumatic stress disorder, unspecified: Secondary | ICD-10-CM | POA: Diagnosis not present

## 2019-07-19 DIAGNOSIS — M5432 Sciatica, left side: Secondary | ICD-10-CM | POA: Diagnosis not present

## 2019-07-19 DIAGNOSIS — R519 Headache, unspecified: Secondary | ICD-10-CM | POA: Diagnosis not present

## 2019-07-19 DIAGNOSIS — D3501 Benign neoplasm of right adrenal gland: Secondary | ICD-10-CM | POA: Diagnosis not present

## 2019-07-19 DIAGNOSIS — M542 Cervicalgia: Secondary | ICD-10-CM | POA: Diagnosis not present

## 2019-07-19 DIAGNOSIS — I700 Atherosclerosis of aorta: Secondary | ICD-10-CM | POA: Diagnosis not present

## 2019-07-19 DIAGNOSIS — S299XXA Unspecified injury of thorax, initial encounter: Secondary | ICD-10-CM | POA: Diagnosis not present

## 2019-07-19 DIAGNOSIS — Z880 Allergy status to penicillin: Secondary | ICD-10-CM | POA: Diagnosis not present

## 2019-07-19 DIAGNOSIS — I10 Essential (primary) hypertension: Secondary | ICD-10-CM | POA: Diagnosis not present

## 2019-07-19 DIAGNOSIS — R079 Chest pain, unspecified: Secondary | ICD-10-CM | POA: Diagnosis not present

## 2019-07-19 DIAGNOSIS — M545 Low back pain: Secondary | ICD-10-CM | POA: Diagnosis not present

## 2019-07-19 DIAGNOSIS — R109 Unspecified abdominal pain: Secondary | ICD-10-CM | POA: Diagnosis not present

## 2019-07-19 DIAGNOSIS — M25552 Pain in left hip: Secondary | ICD-10-CM | POA: Diagnosis not present

## 2019-07-19 DIAGNOSIS — S3991XA Unspecified injury of abdomen, initial encounter: Secondary | ICD-10-CM | POA: Diagnosis not present

## 2019-07-19 DIAGNOSIS — F4310 Post-traumatic stress disorder, unspecified: Secondary | ICD-10-CM | POA: Diagnosis not present

## 2019-07-19 DIAGNOSIS — E079 Disorder of thyroid, unspecified: Secondary | ICD-10-CM | POA: Diagnosis not present

## 2019-07-19 DIAGNOSIS — R52 Pain, unspecified: Secondary | ICD-10-CM | POA: Diagnosis not present

## 2019-07-20 ENCOUNTER — Telehealth: Payer: Self-pay | Admitting: Family Medicine

## 2019-07-20 ENCOUNTER — Encounter: Payer: Self-pay | Admitting: Family Medicine

## 2019-07-20 DIAGNOSIS — E118 Type 2 diabetes mellitus with unspecified complications: Secondary | ICD-10-CM

## 2019-07-20 DIAGNOSIS — I10 Essential (primary) hypertension: Secondary | ICD-10-CM
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
## 2019-07-20 NOTE — Chronic Care Management (AMB) (Signed)
  Chronic Care Management   Note  07/20/2019 Name: Matthew Ortiz. MRN: [id number] DOB: 10-25-1960  Matthew Farr. is a 59 y.o. year old male who is a primary care patient of [name], DO. I reached out to [employer]. by phone today in response to a referral sent by [name]'s PCP, [name], DO.   Matthew Ortiz was given information about Chronic Care Management services today including:  1. CCM service includes personalized support from designated clinical staff supervised by his physician, including individualized plan of care and coordination with other care providers 2. 24/7 contact phone numbers for assistance for urgent and routine care needs. 3. Service will only be billed when office clinical staff spend 20 minutes or more in a month to coordinate care. 4. Only one practitioner may furnish and bill the service in a calendar month. 5. The patient may stop CCM services at any time (effective at the end of the month) by phone call to the office staff.   Patient agreed to services and verbal consent obtained.   Follow up plan:   [place]

## 2019-07-22 DIAGNOSIS — I10 Essential (primary) hypertension: Secondary | ICD-10-CM | POA: Diagnosis not present

## 2019-07-22 DIAGNOSIS — F319 Bipolar disorder, unspecified: Secondary | ICD-10-CM | POA: Diagnosis not present

## 2019-07-22 DIAGNOSIS — J309 Allergic rhinitis, unspecified: Secondary | ICD-10-CM | POA: Diagnosis not present

## 2019-07-22 DIAGNOSIS — E785 Hyperlipidemia, unspecified: Secondary | ICD-10-CM | POA: Diagnosis not present

## 2019-07-22 DIAGNOSIS — F4321 Adjustment disorder with depressed mood: Secondary | ICD-10-CM | POA: Diagnosis not present

## 2019-07-22 DIAGNOSIS — N400 Enlarged prostate without lower urinary tract symptoms: Secondary | ICD-10-CM | POA: Diagnosis not present

## 2019-07-22 DIAGNOSIS — M549 Dorsalgia, unspecified: Secondary | ICD-10-CM | POA: Diagnosis not present

## 2019-07-22 DIAGNOSIS — F4310 Post-traumatic stress disorder, unspecified: Secondary | ICD-10-CM | POA: Diagnosis not present

## 2019-07-22 DIAGNOSIS — M542 Cervicalgia: Secondary | ICD-10-CM | POA: Diagnosis not present

## 2019-07-22 DIAGNOSIS — E1142 Type 2 diabetes mellitus with diabetic polyneuropathy: Secondary | ICD-10-CM | POA: Diagnosis not present

## 2019-07-22 DIAGNOSIS — Z87891 Personal history of nicotine dependence: Secondary | ICD-10-CM | POA: Diagnosis not present

## 2019-07-22 DIAGNOSIS — G4700 Insomnia, unspecified: Secondary | ICD-10-CM | POA: Diagnosis not present

## 2019-07-22 DIAGNOSIS — H547 Unspecified visual loss: Secondary | ICD-10-CM | POA: Diagnosis not present

## 2019-07-22 DIAGNOSIS — Z6824 Body mass index (BMI) 24.0-24.9, adult: Secondary | ICD-10-CM | POA: Diagnosis not present

## 2019-07-23 ENCOUNTER — Ambulatory Visit: Payer: Medicare PPO

## 2019-07-23 DIAGNOSIS — E785 Hyperlipidemia, unspecified: Secondary | ICD-10-CM

## 2019-07-23 DIAGNOSIS — I10 Essential (primary) hypertension: Secondary | ICD-10-CM

## 2019-07-23 DIAGNOSIS — F4310 Post-traumatic stress disorder, unspecified: Secondary | ICD-10-CM | POA: Diagnosis not present

## 2019-07-23 NOTE — Patient Instructions (Addendum)
Visit Information  It was great to speak with you today Matthew Ortiz! Please let me know if there is anything I can do to help take care of you.  Goals Addressed            This Visit's Progress   . Chronic Care Management (pt-stated)       CARE PLAN ENTRY  Current Barriers:  . Chronic Disease Management support, education, and care coordination needs related to hypertension, Asthma, heartburn, Type 2 Diabetes, Osteoarthritis, BPH, anxiety, depression, Bipolar disorder, hyperlipidemia, and insomnia     Pharmacist Clinical Goal(s):  Marland Kitchen A1c goal <6.5% . Blood Pressure goal <140/90 . LDL (bad) cholesterol <100   Interventions: . Comprehensive medication review performed. . Recommend resuming Flonase if allergy symptoms worsen. Will request refill be sent into patient pharmacy.  Patient Self Care Activities:  . Self administers medications as prescribed . Recommend decreasing glucose monitoring to once daily. Can occasionally check in evenings as well.  Initial goal documentation        Matthew Ortiz was given information about Chronic Care Management services today including:  1. CCM service includes personalized support from designated clinical staff supervised by his physician, including individualized plan of care and coordination with other care providers 2. 24/7 contact phone numbers for assistance for urgent and routine care needs. 3. Standard insurance, coinsurance, copays and deductibles apply for chronic care management only during months in which we provide at least 20 minutes of these services. Most insurances cover these services at 100%, however patients may be responsible for any copay, coinsurance and/or deductible if applicable. This service may help you avoid the need for more expensive face-to-face services. 4. Only one practitioner may furnish and bill the service in a calendar month. 5. The patient may stop CCM services at any time (effective at the end of the month) by  phone call to the office staff.  Patient agreed to services and verbal consent obtained.   The patient verbalized understanding of instructions provided today and agreed to receive a mailed copy of patient instruction and/or educational materials. Telephone follow up appointment with pharmacy team member scheduled for: 01/23/20 at 2:00 PM.  Doristine Section [phone]   Allergic Rhinitis, Adult Allergic rhinitis is a reaction to allergens in the air. Allergens are tiny specks (particles) in the air that cause your body to have an allergic reaction. This condition cannot be passed from person to person (is not contagious). Allergic rhinitis cannot be cured, but it can be controlled. There are two types of allergic rhinitis:  Seasonal. This type is also called hay fever. It happens only during certain times of the year.  Perennial. This type can happen at any time of the year. What are the causes? This condition may be caused by:  Pollen from grasses, trees, and weeds.  House dust mites.  Pet dander.  Mold. What are the signs or symptoms? Symptoms of this condition include:  Sneezing.  Runny or stuffy nose (nasal congestion).  A lot of mucus in the back of the throat (postnasal drip).  Itchy nose.  Tearing of the eyes.  Trouble sleeping.  Being sleepy during day. How is this treated? There is no cure for this condition. You should avoid things that trigger your symptoms (allergens). Treatment can help to relieve symptoms. This may include:  Medicines that block allergy symptoms, such as antihistamines. These may be given as a shot, nasal spray, or pill.  Shots that are given until your body  becomes less sensitive to the allergen (desensitization).  Stronger medicines, if all other treatments have not worked. Follow these instructions at home: Avoiding allergens   Find out what you are allergic to. Common allergens include smoke, dust, and pollen.  Avoid them  if you can. These are some of the things that you can do to avoid allergens: ? Replace carpet with wood, tile, or vinyl flooring. Carpet can trap dander and dust. ? Clean any mold found in the home. ? Do not smoke. Do not allow smoking in your home. ? Change your heating and air conditioning filter at least once a month. ? During allergy season:  Keep windows closed as much as you can. If possible, use air conditioning when there is a lot of pollen in the air.  Use a special filter for allergies with your furnace and air conditioner.  Plan outdoor activities when pollen counts are lowest. This is usually during the early morning or evening hours.  If you do go outdoors when pollen count is high, wear a special mask for people with allergies.  When you come indoors, take a shower and change your clothes before sitting on furniture or bedding. General instructions  Do not use fans in your home.  Do not hang clothes outside to dry.  Wear sunglasses to keep pollen out of your eyes.  Wash your hands right away after you touch household pets.  Take over-the-counter and prescription medicines only as told by your doctor.  Keep all follow-up visits as told by your doctor. This is important. Contact a doctor if:  You have a fever.  You have a cough that does not go away (is persistent).  You start to make whistling sounds when you breathe (wheeze).  Your symptoms do not get better with treatment.  You have thick fluid coming from your nose.  You start to have nosebleeds. Get help right away if:  Your tongue or your lips are swollen.  You have trouble breathing.  You feel dizzy or you feel like you are going to pass out (faint).  You have cold sweats. Summary  Allergic rhinitis is a reaction to allergens in the air.  This condition may be caused by allergens. These include pollen, dust mites, pet dander, and mold.  Symptoms include a runny, itchy nose, sneezing, or  tearing eyes. You may also have trouble sleeping or feel sleepy during the day.  Treatment includes taking medicines and avoiding allergens. You may also get shots or take stronger medicines.  Get help if you have a fever or a cough that does not stop. Get help right away if you are short of breath. This information is not intended to replace advice given to you by your health care provider. Make sure you discuss any questions you have with your health care provider. Document Revised: 08/08/2018 Document Reviewed: 11/08/2017 Elsevier Patient Education  [place].  Allergic Rhinitis, Adult Allergic rhinitis is a reaction to allergens in the air. Allergens are tiny specks (particles) in the air that cause your body to have an allergic reaction. This condition cannot be passed from person to person (is not contagious). Allergic rhinitis cannot be cured, but it can be controlled. There are two types of allergic rhinitis:  Seasonal. This type is also called hay fever. It happens only during certain times of the year.  Perennial. This type can happen at any time of the year. What are the causes? This condition may be caused by:  Pollen from grasses, trees, and weeds.  House dust mites.  Pet dander.  Mold. What are the signs or symptoms? Symptoms of this condition include:  Sneezing.  Runny or stuffy nose (nasal congestion).  A lot of mucus in the back of the throat (postnasal drip).  Itchy nose.  Tearing of the eyes.  Trouble sleeping.  Being sleepy during day. How is this treated? There is no cure for this condition. You should avoid things that trigger your symptoms (allergens). Treatment can help to relieve symptoms. This may include:  Medicines that block allergy symptoms, such as antihistamines. These may be given as a shot, nasal spray, or pill.  Shots that are given until your body becomes less sensitive to the allergen (desensitization).  Stronger  medicines, if all other treatments have not worked. Follow these instructions at home: Avoiding allergens   Find out what you are allergic to. Common allergens include smoke, dust, and pollen.  Avoid them if you can. These are some of the things that you can do to avoid allergens: ? Replace carpet with wood, tile, or vinyl flooring. Carpet can trap dander and dust. ? Clean any mold found in the home. ? Do not smoke. Do not allow smoking in your home. ? Change your heating and air conditioning filter at least once a month. ? During allergy season:  Keep windows closed as much as you can. If possible, use air conditioning when there is a lot of pollen in the air.  Use a special filter for allergies with your furnace and air conditioner.  Plan outdoor activities when pollen counts are lowest. This is usually during the early morning or evening hours.  If you do go outdoors when pollen count is high, wear a special mask for people with allergies.  When you come indoors, take a shower and change your clothes before sitting on furniture or bedding. General instructions  Do not use fans in your home.  Do not hang clothes outside to dry.  Wear sunglasses to keep pollen out of your eyes.  Wash your hands right away after you touch household pets.  Take over-the-counter and prescription medicines only as told by your doctor.  Keep all follow-up visits as told by your doctor. This is important. Contact a doctor if:  You have a fever.  You have a cough that does not go away (is persistent).  You start to make whistling sounds when you breathe (wheeze).  Your symptoms do not get better with treatment.  You have thick fluid coming from your nose.  You start to have nosebleeds. Get help right away if:  Your tongue or your lips are swollen.  You have trouble breathing.  You feel dizzy or you feel like you are going to pass out (faint).  You have cold  sweats. Summary  Allergic rhinitis is a reaction to allergens in the air.  This condition may be caused by allergens. These include pollen, dust mites, pet dander, and mold.  Symptoms include a runny, itchy nose, sneezing, or tearing eyes. You may also have trouble sleeping or feel sleepy during the day.  Treatment includes taking medicines and avoiding allergens. You may also get shots or take stronger medicines.  Get help if you have a fever or a cough that does not stop. Get help right away if you are short of breath. This information is not intended to replace advice given to you by your health care provider. Make sure you discuss any questions  you have with your health care provider. Document Revised: 08/08/2018 Document Reviewed: 11/08/2017 Elsevier Patient Education  [place].

## 2019-07-23 NOTE — Addendum Note (Signed)
Addended by: Darral Dash on: 07/23/2019 01:25 PM   Modules accepted: Orders

## 2019-07-26 ENCOUNTER — Encounter: Payer: Self-pay | Admitting: Family Medicine

## 2019-07-26 ENCOUNTER — Other Ambulatory Visit: Payer: Self-pay | Admitting: Family Medicine

## 2019-07-26 ENCOUNTER — Telehealth (INDEPENDENT_AMBULATORY_CARE_PROVIDER_SITE_OTHER): Payer: Medicare PPO | Admitting: Family Medicine

## 2019-07-26 VITALS — BP 119/86 | HR 83 | Ht 72.0 in

## 2019-07-26 DIAGNOSIS — M25552 Pain in left hip: Secondary | ICD-10-CM | POA: Diagnosis not present

## 2019-07-26 DIAGNOSIS — M542 Cervicalgia: Secondary | ICD-10-CM | POA: Diagnosis not present

## 2019-07-26 DIAGNOSIS — R972 Elevated prostate specific antigen [PSA]: Secondary | ICD-10-CM

## 2019-07-26 DIAGNOSIS — M25511 Pain in right shoulder: Secondary | ICD-10-CM | POA: Diagnosis not present

## 2019-07-26 DIAGNOSIS — N400 Enlarged prostate without lower urinary tract symptoms: Secondary | ICD-10-CM

## 2019-07-26 DIAGNOSIS — F4310 Post-traumatic stress disorder, unspecified: Secondary | ICD-10-CM | POA: Diagnosis not present

## 2019-07-26 MED ORDER — PREDNISONE 20 MG PO TABS: ORAL_TABLET | ORAL | 0 refills | Status: DC

## 2019-07-26 MED ORDER — FLUTICASONE PROPIONATE 50 MCG/ACT NA SUSP: 2.0000 | Freq: Every day | NASAL | 3 refills | Status: DC

## 2019-07-26 MED ORDER — HYDROCODONE-ACETAMINOPHEN 5-325 MG PO TABS: 1.0000 | ORAL_TABLET | Freq: Four times a day (QID) | ORAL | 0 refills | Status: DC | PRN
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
## 2019-07-27 ENCOUNTER — Ambulatory Visit: Payer: Medicare PPO | Admitting: Family Medicine

## 2019-07-27 MED FILL — FLUTICASONE PROP 50 MCG SPR: 50 | 30 days supply | Qty: 16 | Fill #0

## 2019-07-30 DIAGNOSIS — F4310 Post-traumatic stress disorder, unspecified: Secondary | ICD-10-CM | POA: Diagnosis not present

## 2019-07-31 DIAGNOSIS — M542 Cervicalgia: Secondary | ICD-10-CM | POA: Diagnosis not present

## 2019-08-02 DIAGNOSIS — M542 Cervicalgia: Secondary | ICD-10-CM | POA: Diagnosis not present

## 2019-08-03 ENCOUNTER — Other Ambulatory Visit (HOSPITAL_COMMUNITY): Payer: Self-pay

## 2019-08-03 ENCOUNTER — Telehealth (HOSPITAL_COMMUNITY): Payer: Self-pay

## 2019-08-03 DIAGNOSIS — F3181 Bipolar II disorder: Secondary | ICD-10-CM

## 2019-08-03 DIAGNOSIS — M542 Cervicalgia: Secondary | ICD-10-CM | POA: Diagnosis not present

## 2019-08-03 MED ORDER — LATUDA 20 MG PO TABS: 20.0000 mg | ORAL_TABLET | Freq: Every evening | ORAL | 0 refills | Status: DC
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
## 2019-08-06 DIAGNOSIS — M542 Cervicalgia: Secondary | ICD-10-CM | POA: Diagnosis not present

## 2019-08-06 DIAGNOSIS — F4310 Post-traumatic stress disorder, unspecified: Secondary | ICD-10-CM | POA: Diagnosis not present

## 2019-08-08 DIAGNOSIS — M542 Cervicalgia: Secondary | ICD-10-CM | POA: Diagnosis not present

## 2019-08-09 DIAGNOSIS — F4310 Post-traumatic stress disorder, unspecified: Secondary | ICD-10-CM | POA: Diagnosis not present

## 2019-08-09 IMAGING — DX DG CHEST 2V
2 series · 2 of 2 positions shown · non-contrast
Comparison: 09/23/2017

CLINICAL DATA: Left lower anterior chest discomfort. Cough and
congestion for 4 days. Former smoker.

EXAM:
CHEST - 2 VIEW

[chest pa]
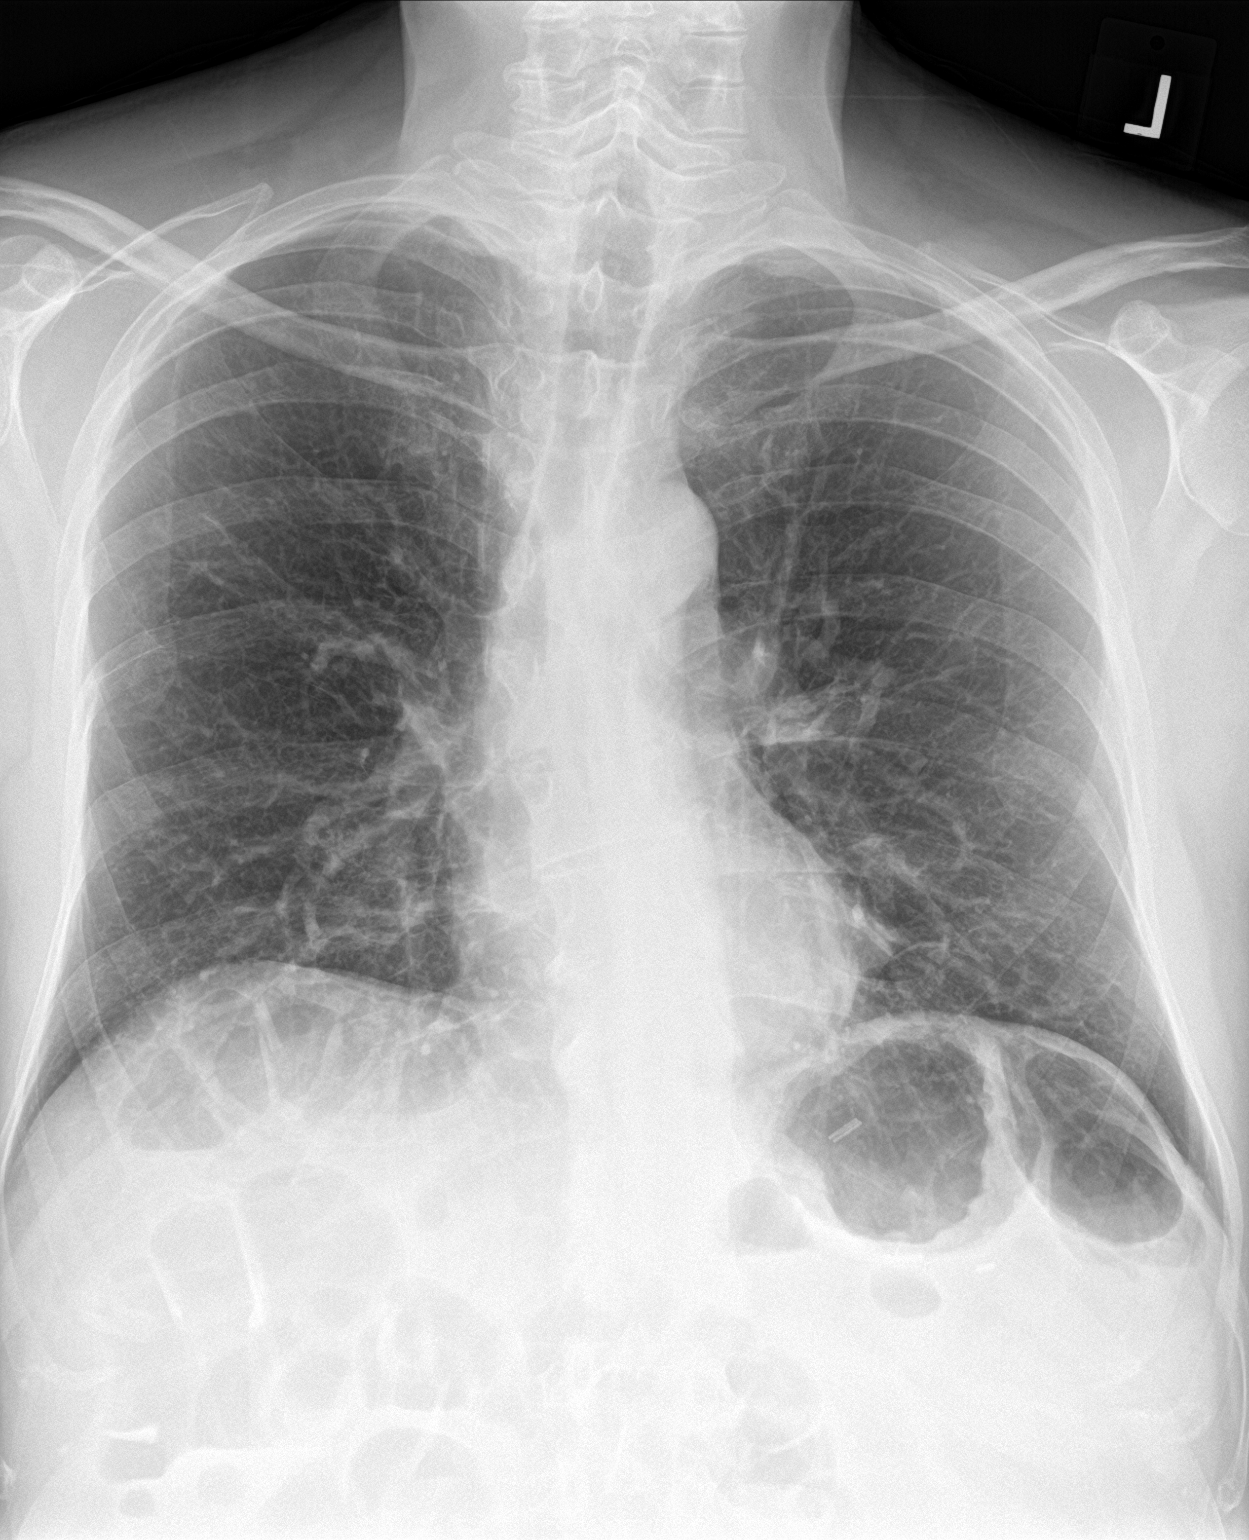

[chest lat]
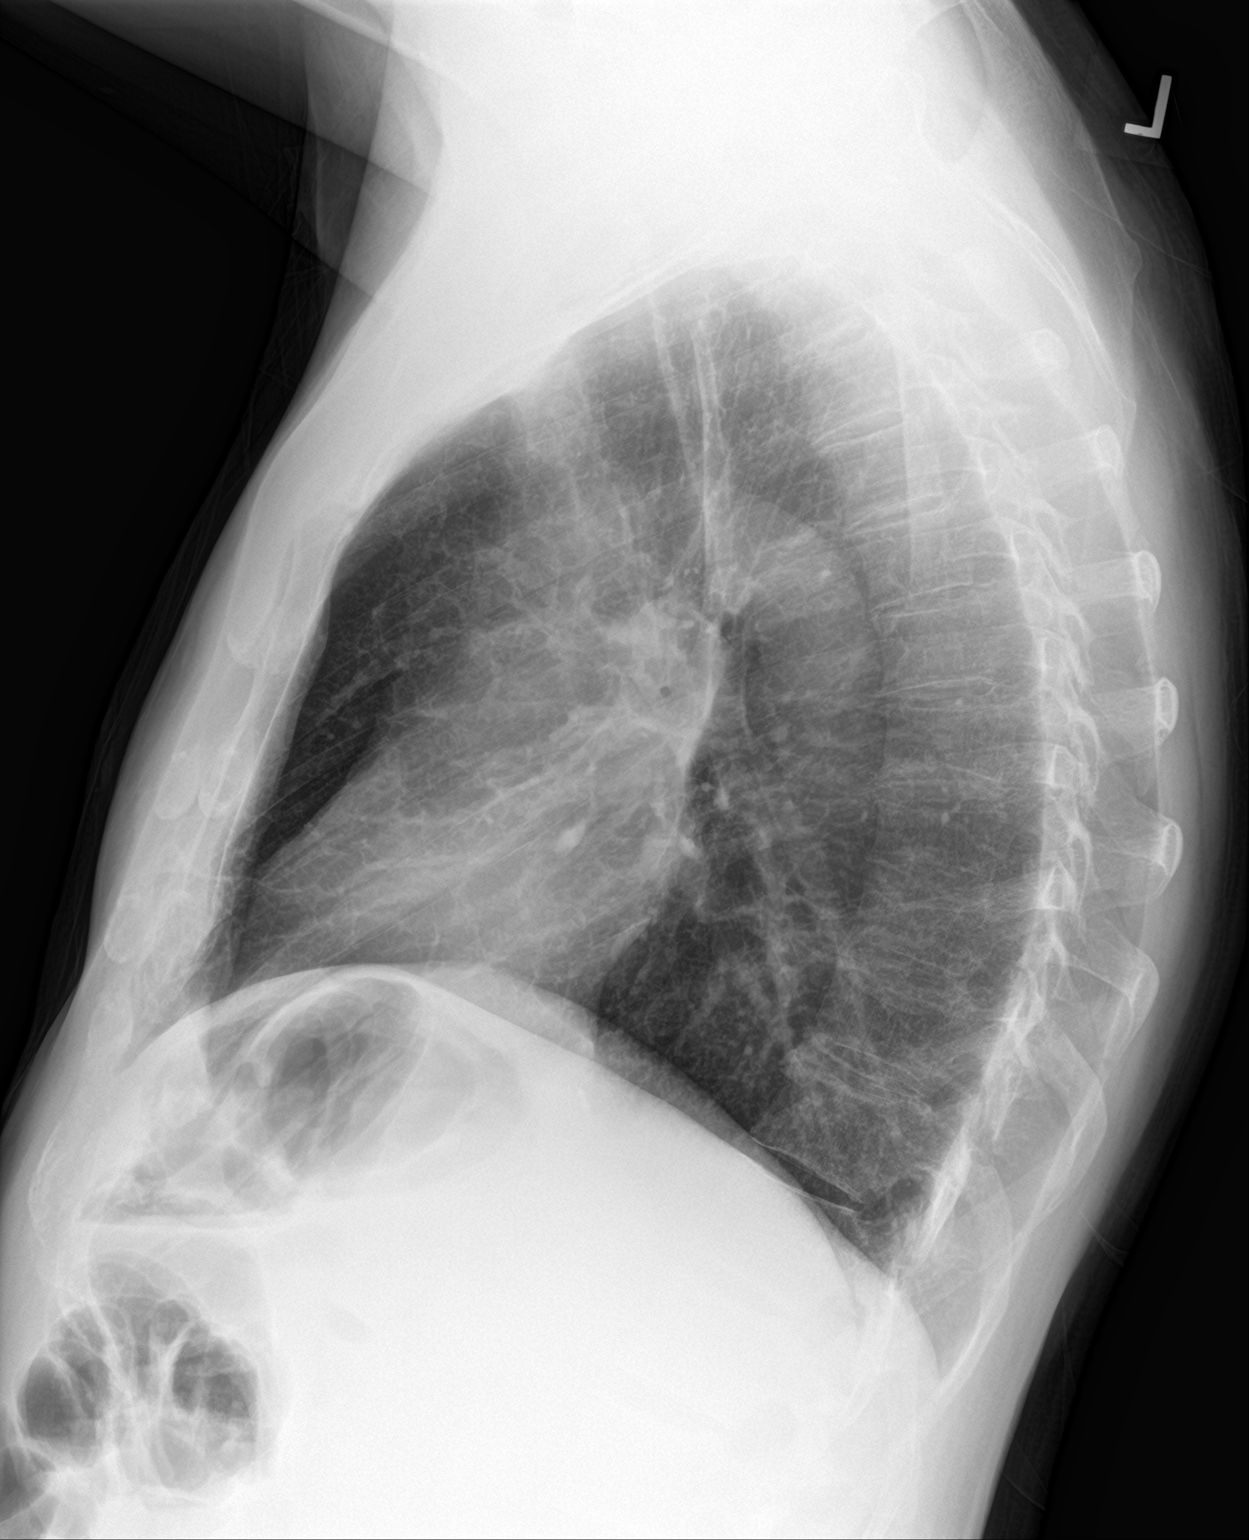

[2 of 2 positions shown; findings below may reference images not displayed]

FINDINGS: The cardiomediastinal silhouette is within normal limits. No
airspace consolidation, edema, pleural effusion, or pneumothorax is
identified. There is mild thoracic dextroscoliosis.
IMPRESSION: No active cardiopulmonary disease.

## 2019-08-10 DIAGNOSIS — M542 Cervicalgia: Secondary | ICD-10-CM | POA: Diagnosis not present

## 2019-08-13 DIAGNOSIS — F4310 Post-traumatic stress disorder, unspecified: Secondary | ICD-10-CM | POA: Diagnosis not present

## 2019-08-13 DIAGNOSIS — M542 Cervicalgia: Secondary | ICD-10-CM | POA: Diagnosis not present

## 2019-08-15 DIAGNOSIS — M542 Cervicalgia: Secondary | ICD-10-CM | POA: Diagnosis not present

## 2019-08-16 ENCOUNTER — Other Ambulatory Visit: Payer: Self-pay

## 2019-08-16 ENCOUNTER — Encounter (HOSPITAL_COMMUNITY): Payer: Self-pay | Admitting: Psychiatry

## 2019-08-16 ENCOUNTER — Ambulatory Visit (INDEPENDENT_AMBULATORY_CARE_PROVIDER_SITE_OTHER): Payer: Medicare PPO | Admitting: Psychiatry

## 2019-08-16 DIAGNOSIS — F411 Generalized anxiety disorder: Secondary | ICD-10-CM

## 2019-08-16 DIAGNOSIS — F424 Excoriation (skin-picking) disorder: Secondary | ICD-10-CM | POA: Diagnosis not present

## 2019-08-16 DIAGNOSIS — F3181 Bipolar II disorder: Secondary | ICD-10-CM | POA: Diagnosis not present

## 2019-08-16 DIAGNOSIS — F5105 Insomnia due to other mental disorder: Secondary | ICD-10-CM | POA: Diagnosis not present

## 2019-08-16 DIAGNOSIS — F4310 Post-traumatic stress disorder, unspecified: Secondary | ICD-10-CM

## 2019-08-16 MED ORDER — ESCITALOPRAM OXALATE 10 MG PO TABS: 30.0000 mg | ORAL_TABLET | Freq: Every day | ORAL | 0 refills | Status: DC

## 2019-08-16 MED ORDER — CLONAZEPAM 1 MG PO TBDP: 1.0000 mg | ORAL_TABLET | Freq: Three times a day (TID) | ORAL | 2 refills | Status: DC | PRN

## 2019-08-16 MED ORDER — LATUDA 20 MG PO TABS: 20.0000 mg | ORAL_TABLET | Freq: Every evening | ORAL | 0 refills | Status: DC

## 2019-08-16 MED ORDER — MIRTAZAPINE 30 MG PO TABS: 60.0000 mg | ORAL_TABLET | Freq: Every day | ORAL | 0 refills | Status: DC

## 2019-08-16 MED ORDER — TRAZODONE HCL 100 MG PO TABS: ORAL_TABLET | ORAL | 0 refills | Status: DC

## 2019-08-16 MED ORDER — LITHIUM CARBONATE 600 MG PO CAPS: 600.0000 mg | ORAL_CAPSULE | Freq: Two times a day (BID) | ORAL | 0 refills | Status: DC
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
## 2019-08-17 ENCOUNTER — Other Ambulatory Visit (HOSPITAL_COMMUNITY): Payer: Self-pay | Admitting: *Deleted

## 2019-08-17 DIAGNOSIS — M25552 Pain in left hip: Secondary | ICD-10-CM | POA: Diagnosis not present

## 2019-08-17 DIAGNOSIS — M25511 Pain in right shoulder: Secondary | ICD-10-CM | POA: Diagnosis not present

## 2019-08-17 DIAGNOSIS — F5105 Insomnia due to other mental disorder: Secondary | ICD-10-CM

## 2019-08-17 DIAGNOSIS — M542 Cervicalgia: Secondary | ICD-10-CM | POA: Diagnosis not present

## 2019-08-17 MED ORDER — TRAZODONE HCL 100 MG PO TABS: ORAL_TABLET | ORAL | 0 refills | Status: DC

## 2019-08-20 DIAGNOSIS — M542 Cervicalgia: Secondary | ICD-10-CM | POA: Diagnosis not present

## 2019-08-20 DIAGNOSIS — F4310 Post-traumatic stress disorder, unspecified: Secondary | ICD-10-CM | POA: Diagnosis not present

## 2019-08-20 DIAGNOSIS — M25552 Pain in left hip: Secondary | ICD-10-CM | POA: Diagnosis not present

## 2019-08-20 DIAGNOSIS — M25511 Pain in right shoulder: Secondary | ICD-10-CM | POA: Diagnosis not present

## 2019-08-22 DIAGNOSIS — M25511 Pain in right shoulder: Secondary | ICD-10-CM | POA: Diagnosis not present

## 2019-08-22 DIAGNOSIS — M25552 Pain in left hip: Secondary | ICD-10-CM | POA: Diagnosis not present

## 2019-08-22 DIAGNOSIS — M542 Cervicalgia: Secondary | ICD-10-CM | POA: Diagnosis not present

## 2019-08-23 DIAGNOSIS — F4310 Post-traumatic stress disorder, unspecified: Secondary | ICD-10-CM | POA: Diagnosis not present

## 2019-08-24 ENCOUNTER — Ambulatory Visit: Payer: Self-pay

## 2019-08-24 DIAGNOSIS — M542 Cervicalgia: Secondary | ICD-10-CM | POA: Diagnosis not present

## 2019-08-24 DIAGNOSIS — M25552 Pain in left hip: Secondary | ICD-10-CM | POA: Diagnosis not present

## 2019-08-24 DIAGNOSIS — I10 Essential (primary) hypertension: Secondary | ICD-10-CM

## 2019-08-24 DIAGNOSIS — M25511 Pain in right shoulder: Secondary | ICD-10-CM | POA: Diagnosis not present

## 2019-08-24 DIAGNOSIS — E118 Type 2 diabetes mellitus with unspecified complications: Secondary | ICD-10-CM
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
## 2019-08-27 DIAGNOSIS — M542 Cervicalgia: Secondary | ICD-10-CM | POA: Diagnosis not present

## 2019-08-27 DIAGNOSIS — F4310 Post-traumatic stress disorder, unspecified: Secondary | ICD-10-CM | POA: Diagnosis not present

## 2019-08-27 DIAGNOSIS — M25511 Pain in right shoulder: Secondary | ICD-10-CM | POA: Diagnosis not present

## 2019-08-27 DIAGNOSIS — M25552 Pain in left hip: Secondary | ICD-10-CM | POA: Diagnosis not present

## 2019-08-29 ENCOUNTER — Other Ambulatory Visit: Payer: Self-pay | Admitting: Family Medicine

## 2019-08-29 DIAGNOSIS — M542 Cervicalgia: Secondary | ICD-10-CM | POA: Diagnosis not present

## 2019-08-29 DIAGNOSIS — E118 Type 2 diabetes mellitus with unspecified complications: Secondary | ICD-10-CM

## 2019-08-29 DIAGNOSIS — M25552 Pain in left hip: Secondary | ICD-10-CM | POA: Diagnosis not present

## 2019-08-29 DIAGNOSIS — M25511 Pain in right shoulder: Secondary | ICD-10-CM | POA: Diagnosis not present

## 2019-08-29 MED ORDER — METFORMIN HCL 500 MG PO TABS: 500.0000 mg | ORAL_TABLET | Freq: Two times a day (BID) | ORAL | 1 refills | Status: DC

## 2019-08-29 MED FILL — METFORMIN HCL 500 MG TABS: 500 | 90 days supply | Qty: 180 | Fill #0

## 2019-08-30 DIAGNOSIS — F4310 Post-traumatic stress disorder, unspecified: Secondary | ICD-10-CM | POA: Diagnosis not present

## 2019-08-31 DIAGNOSIS — M25552 Pain in left hip: Secondary | ICD-10-CM | POA: Diagnosis not present

## 2019-08-31 DIAGNOSIS — M25511 Pain in right shoulder: Secondary | ICD-10-CM | POA: Diagnosis not present

## 2019-09-03 DIAGNOSIS — M25511 Pain in right shoulder: Secondary | ICD-10-CM | POA: Diagnosis not present

## 2019-09-03 DIAGNOSIS — F4310 Post-traumatic stress disorder, unspecified: Secondary | ICD-10-CM | POA: Diagnosis not present

## 2019-09-03 DIAGNOSIS — M25552 Pain in left hip: Secondary | ICD-10-CM | POA: Diagnosis not present

## 2019-09-05 DIAGNOSIS — M25511 Pain in right shoulder: Secondary | ICD-10-CM | POA: Diagnosis not present

## 2019-09-05 DIAGNOSIS — M25552 Pain in left hip: Secondary | ICD-10-CM | POA: Diagnosis not present

## 2019-09-06 DIAGNOSIS — F4310 Post-traumatic stress disorder, unspecified: Secondary | ICD-10-CM | POA: Diagnosis not present

## 2019-09-07 DIAGNOSIS — M25511 Pain in right shoulder: Secondary | ICD-10-CM | POA: Diagnosis not present

## 2019-09-07 DIAGNOSIS — M25552 Pain in left hip: Secondary | ICD-10-CM | POA: Diagnosis not present

## 2019-09-10 DIAGNOSIS — M25511 Pain in right shoulder: Secondary | ICD-10-CM | POA: Diagnosis not present

## 2019-09-10 DIAGNOSIS — M25552 Pain in left hip: Secondary | ICD-10-CM | POA: Diagnosis not present

## 2019-09-12 DIAGNOSIS — M25552 Pain in left hip: Secondary | ICD-10-CM | POA: Diagnosis not present

## 2019-09-12 DIAGNOSIS — M25511 Pain in right shoulder: Secondary | ICD-10-CM | POA: Diagnosis not present

## 2019-09-13 DIAGNOSIS — F4310 Post-traumatic stress disorder, unspecified: Secondary | ICD-10-CM | POA: Diagnosis not present

## 2019-09-14 DIAGNOSIS — M25511 Pain in right shoulder: Secondary | ICD-10-CM | POA: Diagnosis not present

## 2019-09-14 DIAGNOSIS — M25552 Pain in left hip: Secondary | ICD-10-CM | POA: Diagnosis not present

## 2019-09-17 DIAGNOSIS — F4310 Post-traumatic stress disorder, unspecified: Secondary | ICD-10-CM | POA: Diagnosis not present

## 2019-09-17 DIAGNOSIS — M25552 Pain in left hip: Secondary | ICD-10-CM | POA: Diagnosis not present

## 2019-09-17 DIAGNOSIS — M25511 Pain in right shoulder: Secondary | ICD-10-CM | POA: Diagnosis not present

## 2019-09-19 DIAGNOSIS — M25552 Pain in left hip: Secondary | ICD-10-CM | POA: Diagnosis not present

## 2019-09-19 DIAGNOSIS — M25511 Pain in right shoulder: Secondary | ICD-10-CM | POA: Diagnosis not present

## 2019-09-20 DIAGNOSIS — F4310 Post-traumatic stress disorder, unspecified: Secondary | ICD-10-CM | POA: Diagnosis not present

## 2019-09-21 DIAGNOSIS — M25552 Pain in left hip: Secondary | ICD-10-CM | POA: Diagnosis not present

## 2019-09-21 DIAGNOSIS — M25511 Pain in right shoulder: Secondary | ICD-10-CM | POA: Diagnosis not present

## 2019-09-24 DIAGNOSIS — M25511 Pain in right shoulder: Secondary | ICD-10-CM | POA: Diagnosis not present

## 2019-09-24 DIAGNOSIS — M25552 Pain in left hip: Secondary | ICD-10-CM | POA: Diagnosis not present

## 2019-09-24 DIAGNOSIS — F4310 Post-traumatic stress disorder, unspecified: Secondary | ICD-10-CM | POA: Diagnosis not present

## 2019-09-26 DIAGNOSIS — M25511 Pain in right shoulder: Secondary | ICD-10-CM | POA: Diagnosis not present

## 2019-09-26 DIAGNOSIS — M25552 Pain in left hip: Secondary | ICD-10-CM | POA: Diagnosis not present

## 2019-09-27 DIAGNOSIS — F4310 Post-traumatic stress disorder, unspecified: Secondary | ICD-10-CM | POA: Diagnosis not present

## 2019-09-28 DIAGNOSIS — M25511 Pain in right shoulder: Secondary | ICD-10-CM | POA: Diagnosis not present

## 2019-09-28 DIAGNOSIS — M25552 Pain in left hip: Secondary | ICD-10-CM | POA: Diagnosis not present

## 2019-10-01 DIAGNOSIS — M25511 Pain in right shoulder: Secondary | ICD-10-CM | POA: Diagnosis not present

## 2019-10-01 DIAGNOSIS — F4310 Post-traumatic stress disorder, unspecified: Secondary | ICD-10-CM | POA: Diagnosis not present

## 2019-10-01 DIAGNOSIS — M25552 Pain in left hip: Secondary | ICD-10-CM | POA: Diagnosis not present

## 2019-10-03 ENCOUNTER — Other Ambulatory Visit (HOSPITAL_COMMUNITY): Payer: Self-pay | Admitting: Psychiatry

## 2019-10-03 ENCOUNTER — Ambulatory Visit: Payer: Self-pay

## 2019-10-03 DIAGNOSIS — E118 Type 2 diabetes mellitus with unspecified complications: Secondary | ICD-10-CM

## 2019-10-03 DIAGNOSIS — F411 Generalized anxiety disorder: Secondary | ICD-10-CM

## 2019-10-03 DIAGNOSIS — M25552 Pain in left hip: Secondary | ICD-10-CM | POA: Diagnosis not present

## 2019-10-03 DIAGNOSIS — F5105 Insomnia due to other mental disorder: Secondary | ICD-10-CM

## 2019-10-03 DIAGNOSIS — I10 Essential (primary) hypertension: Secondary | ICD-10-CM

## 2019-10-03 DIAGNOSIS — M25511 Pain in right shoulder: Secondary | ICD-10-CM | POA: Diagnosis not present

## 2019-10-03 DIAGNOSIS — F4310 Post-traumatic stress disorder, unspecified: Secondary | ICD-10-CM

## 2019-10-03 NOTE — Chronic Care Management (AMB) (Signed)
Diabetic assessment was performed for Matthew Ortiz., 06-Jan-1961 on 10/03/2019 by Cristie Hem. The patient's last A1C was 5.5% on 10/27/2018, it has Remained Stable to 5.5% on 01/26/2019. Over the past 6 months, the following interventions have been made to improve Glycemic control: None since last CPP intervention.  The patient has not had an ED visit since their last CPP follow up. Patient is currently on a STATIN medication.   Patient is not currently on an ACE-I/ARB.  Their microalbumin is not currently above 30. The patient's current diabetic meds are: Atorvastatin 10 mg (09/19/19 90DS), metformin 500 mg BID (08/29/19 90DS). They currently do not have a greater than 5 day gap between last fill.   The patient is checking blood sugars daily. Recent fasting (before breakfast) readings are mainly staying in the 120s, he denies any instances of >130. One instance of 63, patient denies symptoms of hypoglycemia at that time. The patient is not currently having lows <70.  The patient is not currently having fasting blood sugars >170, and not currently having any blood sugars above 250.The patient is currently checking their feet daily, and they do not currently have cuts, swelling, blisters, redness, or any signs of infections.   Patient current diet is:  Stable.  Patient's Diet response: Eats primarily lunch, a snack, and dinner. Recent meals include lasagna, hamburger. We discussed portion control and monitoring carbohydrate intake.  Exercise Response: Undergoing PT three times weekly. Neck, shoulder pain and balance have improved and feels confident in ability to walk around, but his house is not conducive to walking. He feels his knee has worsened recently and describes times where he is concerned it is going to give out on him. He is interested in scheduling an in-office appointment with Dr. [name] to address this concern.               Recommendations:  Patient is due for an A1c and urine microalbumin as well  as a diabetic foot and ophthalmology exam. Recommend drawing/scheduling these labs at patient's next office visit.    [place] [facility]  [phone]

## 2019-10-04 ENCOUNTER — Encounter: Payer: Self-pay | Admitting: Family Medicine

## 2019-10-04 DIAGNOSIS — F4310 Post-traumatic stress disorder, unspecified: Secondary | ICD-10-CM | POA: Diagnosis not present

## 2019-10-05 DIAGNOSIS — M542 Cervicalgia: Secondary | ICD-10-CM | POA: Diagnosis not present

## 2019-10-05 DIAGNOSIS — M25511 Pain in right shoulder: Secondary | ICD-10-CM | POA: Diagnosis not present

## 2019-10-05 DIAGNOSIS — M25552 Pain in left hip: Secondary | ICD-10-CM | POA: Diagnosis not present

## 2019-10-08 DIAGNOSIS — F4310 Post-traumatic stress disorder, unspecified: Secondary | ICD-10-CM | POA: Diagnosis not present

## 2019-10-08 DIAGNOSIS — M25552 Pain in left hip: Secondary | ICD-10-CM | POA: Diagnosis not present

## 2019-10-08 DIAGNOSIS — M542 Cervicalgia: Secondary | ICD-10-CM | POA: Diagnosis not present

## 2019-10-08 DIAGNOSIS — M25511 Pain in right shoulder: Secondary | ICD-10-CM | POA: Diagnosis not present

## 2019-10-10 ENCOUNTER — Other Ambulatory Visit (HOSPITAL_COMMUNITY): Payer: Self-pay | Admitting: Psychiatry

## 2019-10-10 DIAGNOSIS — F3181 Bipolar II disorder: Secondary | ICD-10-CM

## 2019-10-11 ENCOUNTER — Other Ambulatory Visit: Payer: Self-pay | Admitting: Family Medicine

## 2019-10-11 DIAGNOSIS — M25552 Pain in left hip: Secondary | ICD-10-CM | POA: Diagnosis not present

## 2019-10-11 DIAGNOSIS — M25511 Pain in right shoulder: Secondary | ICD-10-CM | POA: Diagnosis not present

## 2019-10-11 DIAGNOSIS — F4310 Post-traumatic stress disorder, unspecified: Secondary | ICD-10-CM | POA: Diagnosis not present

## 2019-10-11 DIAGNOSIS — M542 Cervicalgia: Secondary | ICD-10-CM | POA: Diagnosis not present

## 2019-10-11 NOTE — Telephone Encounter (Signed)
Last VV 07/26/2019 Last fill 04/17/19  #90/1

## 2019-10-12 DIAGNOSIS — M25552 Pain in left hip: Secondary | ICD-10-CM | POA: Diagnosis not present

## 2019-10-12 DIAGNOSIS — M542 Cervicalgia: Secondary | ICD-10-CM | POA: Diagnosis not present

## 2019-10-12 DIAGNOSIS — M25511 Pain in right shoulder: Secondary | ICD-10-CM | POA: Diagnosis not present

## 2019-10-15 DIAGNOSIS — M25511 Pain in right shoulder: Secondary | ICD-10-CM | POA: Diagnosis not present

## 2019-10-15 DIAGNOSIS — F4310 Post-traumatic stress disorder, unspecified: Secondary | ICD-10-CM | POA: Diagnosis not present

## 2019-10-15 DIAGNOSIS — M25552 Pain in left hip: Secondary | ICD-10-CM | POA: Diagnosis not present

## 2019-10-15 DIAGNOSIS — M542 Cervicalgia: Secondary | ICD-10-CM | POA: Diagnosis not present

## 2019-10-18 ENCOUNTER — Encounter (HOSPITAL_COMMUNITY): Payer: Self-pay | Admitting: Psychiatry

## 2019-10-18 ENCOUNTER — Other Ambulatory Visit: Payer: Self-pay

## 2019-10-18 ENCOUNTER — Telehealth (INDEPENDENT_AMBULATORY_CARE_PROVIDER_SITE_OTHER): Payer: Medicare PPO | Admitting: Psychiatry

## 2019-10-18 DIAGNOSIS — F411 Generalized anxiety disorder: Secondary | ICD-10-CM | POA: Diagnosis not present

## 2019-10-18 DIAGNOSIS — F4310 Post-traumatic stress disorder, unspecified: Secondary | ICD-10-CM

## 2019-10-18 DIAGNOSIS — F424 Excoriation (skin-picking) disorder: Secondary | ICD-10-CM | POA: Diagnosis not present

## 2019-10-18 DIAGNOSIS — F3181 Bipolar II disorder: Secondary | ICD-10-CM | POA: Diagnosis not present

## 2019-10-18 DIAGNOSIS — F5105 Insomnia due to other mental disorder: Secondary | ICD-10-CM

## 2019-10-18 MED ORDER — TRAZODONE HCL 100 MG PO TABS: ORAL_TABLET | ORAL | 0 refills | Status: DC

## 2019-10-18 MED ORDER — LITHIUM CARBONATE 600 MG PO CAPS: 600.0000 mg | ORAL_CAPSULE | Freq: Two times a day (BID) | ORAL | 0 refills | Status: DC

## 2019-10-18 MED ORDER — MIRTAZAPINE 30 MG PO TABS: 60.0000 mg | ORAL_TABLET | Freq: Every day | ORAL | 0 refills | Status: DC

## 2019-10-18 MED ORDER — CLONAZEPAM 1 MG PO TBDP: 1.0000 mg | ORAL_TABLET | Freq: Three times a day (TID) | ORAL | 2 refills | Status: DC | PRN

## 2019-10-18 MED ORDER — ESCITALOPRAM OXALATE 10 MG PO TABS: 30.0000 mg | ORAL_TABLET | Freq: Every day | ORAL | 0 refills | Status: DC

## 2019-10-18 MED FILL — clonazePAM 1 MG TBDP: 1 | 20 days supply | Qty: 60 | Fill #0

## 2019-10-18 NOTE — Progress Notes (Signed)
Virtual Visit via Telephone Note  I connected with Matthew Ortiz. on 10/18/19 at  2:00 PM EDT by telephone and verified that I am speaking with the correct person using two identifiers.  Location: Patient: home Provider: office   I discussed the limitations, risks, security and privacy concerns of performing an evaluation and management service by telephone and the availability of in person appointments. I also discussed with the patient that there may be a patient responsible charge related to this service. The patient expressed understanding and agreed to proceed.   History of Present Illness: Jearld Fenton reports his dog has rectal cancer. He is not doing well. It is going to be very hard to let the dog go. They continue to search for houses but thinks they might hold off for a while due to prices. His neighbor is selling his house and it always around. As a result his HV is high.  All this has raised [name] stress level. Despite all these stressors he is not picking his skin. He is able to fall asleep easily. He continues to deal with his nightmares. His energy is more level and he does not feel the need for naps during the day anymore. Jearld Fenton is depressed. He is feeling regretful about things he didn't do earlier. Jearld Fenton denies manic and hypomanic like symptoms since our last visit. He denies hopelessness. He denies SI/HI. Jearld Fenton is trying to pursue his new hobby of painting. He is physical therapy until the 27th. Due to the knee pain he is not able to walk more than 1/2 mile. He wants to be able to walk 2-4 miles/day.   Observations/Objective:  General Appearance: unable to assess  Eye Contact:  unable to assess  Speech:  Clear and Coherent and Normal Rate  Volume:  Normal  Mood:  Anxious and Depressed  Affect:  Congruent  Thought Process:  Coherent and Descriptions of Associations: Circumstantial  Orientation:  Full (Time, Place, and Person)  Thought Content:  Rumination  Suicidal  Thoughts:  No  Homicidal Thoughts:  No  Memory:  Immediate;   Good  Judgement:  Good  Insight:  Good  Psychomotor Activity: unable to assess  Concentration:  Concentration: Fair  Recall:  [employer] of Knowledge:  Good  Language:  Good  Akathisia:  unable to assess  Handed:  Right  AIMS (if indicated):     Assets:  Communication Skills Desire for Improvement Financial Resources/Insurance Housing Resilience Social Support Talents/Skills Transportation Vocational/Educational  ADL's:  unable to assess  Cognition:  WNL  Sleep:          I reviewed the information below on 10/18/19 and have updated it Assessment and Plan:  PTSD; Bipolar II- depressed; GAD; Skin picking d/o; Insomnia   Status of current symptoms: ongoing depression, anxiety and PTSD   Continue: -Latuda 20mg  po qHS -Lithium 600mg  po BID -Klonopin 1mg  po TID -Remeron 60mg  po qHS -Lexapro 30mg  po qD- pt is aware this is above the FDA recommended dose. He is reporting benefit and denies SE. He would like to continue taking it. -Trazodone 150mg  po qHS and 100mg  prn middle of the night insomnia - N- acetylcysteine 1 tab qD for skin picking  PCP has scheduled blood draw for early July.  Follow Up Instructions: In 2-3 months or sooner if needed   I discussed the assessment and treatment plan with the patient. The patient was provided an opportunity to ask questions and all were answered. The patient agreed with the  plan and demonstrated an understanding of the instructions.   The patient was advised to call back or seek an in-person evaluation if the symptoms worsen or if the condition fails to improve as anticipated.  I provided 25 minutes of non-face-to-face time during this encounter.   Charlcie Cradle, MD

## 2019-10-19 ENCOUNTER — Other Ambulatory Visit (HOSPITAL_COMMUNITY): Payer: Self-pay

## 2019-10-19 DIAGNOSIS — F3181 Bipolar II disorder: Secondary | ICD-10-CM

## 2019-10-19 MED ORDER — LATUDA 20 MG PO TABS: 20.0000 mg | ORAL_TABLET | Freq: Every evening | ORAL | 0 refills | Status: DC

## 2019-10-22 DIAGNOSIS — F4310 Post-traumatic stress disorder, unspecified: Secondary | ICD-10-CM | POA: Diagnosis not present

## 2019-10-22 DIAGNOSIS — M542 Cervicalgia: Secondary | ICD-10-CM | POA: Diagnosis not present

## 2019-10-22 DIAGNOSIS — M25511 Pain in right shoulder: Secondary | ICD-10-CM | POA: Diagnosis not present

## 2019-10-22 DIAGNOSIS — M25552 Pain in left hip: Secondary | ICD-10-CM | POA: Diagnosis not present

## 2019-10-24 DIAGNOSIS — M542 Cervicalgia: Secondary | ICD-10-CM | POA: Diagnosis not present

## 2019-10-24 DIAGNOSIS — M25552 Pain in left hip: Secondary | ICD-10-CM | POA: Diagnosis not present

## 2019-10-24 DIAGNOSIS — M25511 Pain in right shoulder: Secondary | ICD-10-CM | POA: Diagnosis not present

## 2019-10-25 DIAGNOSIS — F4310 Post-traumatic stress disorder, unspecified: Secondary | ICD-10-CM | POA: Diagnosis not present

## 2019-10-26 DIAGNOSIS — M25511 Pain in right shoulder: Secondary | ICD-10-CM | POA: Diagnosis not present

## 2019-10-26 DIAGNOSIS — M542 Cervicalgia: Secondary | ICD-10-CM | POA: Diagnosis not present

## 2019-10-26 DIAGNOSIS — M25552 Pain in left hip: Secondary | ICD-10-CM | POA: Diagnosis not present

## 2019-10-29 DIAGNOSIS — M25552 Pain in left hip: Secondary | ICD-10-CM | POA: Diagnosis not present

## 2019-10-29 DIAGNOSIS — M542 Cervicalgia: Secondary | ICD-10-CM | POA: Diagnosis not present

## 2019-10-29 DIAGNOSIS — M25511 Pain in right shoulder: Secondary | ICD-10-CM | POA: Diagnosis not present

## 2019-10-29 DIAGNOSIS — F4310 Post-traumatic stress disorder, unspecified: Secondary | ICD-10-CM | POA: Diagnosis not present

## 2019-10-30 ENCOUNTER — Other Ambulatory Visit (HOSPITAL_COMMUNITY): Payer: Self-pay | Admitting: Psychiatry

## 2019-10-30 DIAGNOSIS — F411 Generalized anxiety disorder: Secondary | ICD-10-CM

## 2019-10-30 DIAGNOSIS — F424 Excoriation (skin-picking) disorder: Secondary | ICD-10-CM

## 2019-10-30 DIAGNOSIS — F4310 Post-traumatic stress disorder, unspecified: Secondary | ICD-10-CM

## 2019-10-31 DIAGNOSIS — M542 Cervicalgia: Secondary | ICD-10-CM | POA: Diagnosis not present

## 2019-10-31 DIAGNOSIS — M25552 Pain in left hip: Secondary | ICD-10-CM | POA: Diagnosis not present

## 2019-10-31 DIAGNOSIS — M25511 Pain in right shoulder: Secondary | ICD-10-CM | POA: Diagnosis not present

## 2019-11-01 DIAGNOSIS — F4310 Post-traumatic stress disorder, unspecified: Secondary | ICD-10-CM | POA: Diagnosis not present

## 2019-11-05 DIAGNOSIS — F4310 Post-traumatic stress disorder, unspecified: Secondary | ICD-10-CM | POA: Diagnosis not present

## 2019-11-08 ENCOUNTER — Encounter: Payer: Medicare PPO | Admitting: Family Medicine

## 2019-11-08 DIAGNOSIS — F4310 Post-traumatic stress disorder, unspecified: Secondary | ICD-10-CM | POA: Diagnosis not present

## 2019-11-12 DIAGNOSIS — F4310 Post-traumatic stress disorder, unspecified: Secondary | ICD-10-CM | POA: Diagnosis not present

## 2019-11-14 ENCOUNTER — Other Ambulatory Visit: Payer: Self-pay

## 2019-11-15 ENCOUNTER — Ambulatory Visit (INDEPENDENT_AMBULATORY_CARE_PROVIDER_SITE_OTHER): Payer: Medicare PPO | Admitting: Family Medicine

## 2019-11-15 ENCOUNTER — Encounter: Payer: Self-pay | Admitting: Family Medicine

## 2019-11-15 ENCOUNTER — Ambulatory Visit (INDEPENDENT_AMBULATORY_CARE_PROVIDER_SITE_OTHER): Payer: Medicare PPO

## 2019-11-15 VITALS — BP 108/90 | HR 78 | Temp 97.6°F | Ht 75.0 in | Wt 166.6 lb

## 2019-11-15 DIAGNOSIS — E118 Type 2 diabetes mellitus with unspecified complications: Secondary | ICD-10-CM

## 2019-11-15 DIAGNOSIS — F32A Depression, unspecified: Secondary | ICD-10-CM

## 2019-11-15 DIAGNOSIS — I10 Essential (primary) hypertension: Secondary | ICD-10-CM

## 2019-11-15 DIAGNOSIS — M25561 Pain in right knee: Secondary | ICD-10-CM

## 2019-11-15 DIAGNOSIS — N529 Male erectile dysfunction, unspecified: Secondary | ICD-10-CM | POA: Diagnosis not present

## 2019-11-15 DIAGNOSIS — G8929 Other chronic pain: Secondary | ICD-10-CM

## 2019-11-15 DIAGNOSIS — Z23 Encounter for immunization: Secondary | ICD-10-CM

## 2019-11-15 DIAGNOSIS — J449 Chronic obstructive pulmonary disease, unspecified: Secondary | ICD-10-CM | POA: Insufficient documentation

## 2019-11-15 DIAGNOSIS — F419 Anxiety disorder, unspecified: Secondary | ICD-10-CM

## 2019-11-15 DIAGNOSIS — F329 Major depressive disorder, single episode, unspecified: Secondary | ICD-10-CM | POA: Diagnosis not present

## 2019-11-15 DIAGNOSIS — E785 Hyperlipidemia, unspecified: Secondary | ICD-10-CM

## 2019-11-15 DIAGNOSIS — Z79899 Other long term (current) drug therapy: Secondary | ICD-10-CM | POA: Diagnosis not present

## 2019-11-15 DIAGNOSIS — E559 Vitamin D deficiency, unspecified: Secondary | ICD-10-CM

## 2019-11-15 DIAGNOSIS — N400 Enlarged prostate without lower urinary tract symptoms: Secondary | ICD-10-CM | POA: Diagnosis not present

## 2019-11-15 LAB — BASIC METABOLIC PANEL
BUN: 16 mg/dL (ref 6–23)
CO2: 26 mEq/L (ref 19–32)
Calcium: 9.9 mg/dL (ref 8.4–10.5)
Chloride: 107 mEq/L (ref 96–112)
Creatinine, Ser: 0.97 mg/dL (ref 0.40–1.50)
GFR: 78.82 mL/min (ref >60.00)
Glucose, Bld: 120 mg/dL — ABNORMAL HIGH (ref 70–99)
Potassium: 4.7 mEq/L (ref 3.5–5.1)
Sodium: 141 mEq/L (ref 135–145)

## 2019-11-15 LAB — LIPID PANEL
Cholesterol: 89 mg/dL (ref 0–200)
HDL: 35.4 mg/dL — ABNORMAL LOW (ref >39.00)
LDL Cholesterol: 27 mg/dL (ref 0–99)
NonHDL: 53.19
Total CHOL/HDL Ratio: 3
Triglycerides: 129 mg/dL (ref 0.0–149.0)
VLDL: 25.8 mg/dL (ref 0.0–40.0)

## 2019-11-15 LAB — MICROALBUMIN / CREATININE URINE RATIO
Creatinine,U: 224 mg/dL
Microalb Creat Ratio: 0.7 mg/g (ref 0.0–30.0)
Microalb, Ur: 1.6 mg/dL (ref 0.0–1.9)

## 2019-11-15 LAB — HEMOGLOBIN A1C: Hgb A1c MFr Bld: 5.2 % (ref 4.6–6.5)

## 2019-11-15 LAB — TESTOSTERONE: Testosterone: 417.28 ng/dL (ref 300.00–890.00)

## 2019-11-15 LAB — ALT: ALT: 14 U/L (ref 0–53)

## 2019-11-15 LAB — VITAMIN D 25 HYDROXY (VIT D DEFICIENCY, FRACTURES): VITD: 39.39 ng/mL (ref 30.00–100.00)

## 2019-11-15 LAB — PSA: PSA: 1.15 ng/mL (ref 0.10–4.00)

## 2019-11-15 LAB — AST: AST: 9 U/L (ref 0–37)

## 2019-11-15 MED ORDER — METFORMIN HCL 500 MG PO TABS: 500.0000 mg | ORAL_TABLET | Freq: Two times a day (BID) | ORAL | 3 refills | Status: DC

## 2019-11-15 MED ORDER — GLUCOSE BLOOD VI STRP: ORAL_STRIP | 3 refills | Status: AC

## 2019-11-15 MED FILL — METFORMIN HCL 500 MG TABS: 500 | 90 days supply | Qty: 180 | Fill #0
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
## 2019-11-16 ENCOUNTER — Encounter: Payer: Self-pay | Admitting: Family Medicine

## 2019-11-16 LAB — LITHIUM LEVEL: Lithium Lvl: 0.6 mmol/L (ref 0.6–1.2)

## 2019-11-19 DIAGNOSIS — F4310 Post-traumatic stress disorder, unspecified: Secondary | ICD-10-CM | POA: Diagnosis not present

## 2019-11-22 DIAGNOSIS — F4310 Post-traumatic stress disorder, unspecified: Secondary | ICD-10-CM | POA: Diagnosis not present

## 2019-11-26 DIAGNOSIS — F4310 Post-traumatic stress disorder, unspecified: Secondary | ICD-10-CM | POA: Diagnosis not present

## 2019-11-29 DIAGNOSIS — F4310 Post-traumatic stress disorder, unspecified: Secondary | ICD-10-CM | POA: Diagnosis not present

## 2019-12-05 ENCOUNTER — Telehealth: Payer: Self-pay

## 2019-12-05 DIAGNOSIS — I10 Essential (primary) hypertension: Secondary | ICD-10-CM

## 2019-12-05 DIAGNOSIS — E118 Type 2 diabetes mellitus with unspecified complications: Secondary | ICD-10-CM

## 2019-12-05 NOTE — Progress Notes (Addendum)
Chronic Care Management Pharmacy Assistant   Name: Matthew Ortiz.  MRN: [id number] DOB: December 06, 1960  Reason for Encounter: Hypertension  Disease State Call.  Patient Questions:  1.  Have you seen any other providers since your last visit? Yes, 11/15/2019 PCP - [name]   PCP : [name], DO  Allergies:   Allergies  Allergen Reactions  . Bee Pollen   . Milk-Related Compounds Diarrhea  . Penicillins Rash    Medications: Outpatient Encounter Medications as of 12/05/2019  Medication Sig  . Acetylcysteine (N-ACETYL-L-CYSTEINE) POWD 1 Units/day by Does not apply route daily.  Marland Kitchen albuterol (PROAIR HFA) 108 (90 Base) MCG/ACT inhaler Inhale 1-2 puffs into the lungs every 6 (six) hours as needed for wheezing or shortness of breath.  Marland Kitchen atorvastatin (LIPITOR) 10 MG tablet Take 1 tablet (10 mg total) by mouth daily.  . clonazePAM (KLONOPIN) 1 MG disintegrating tablet Take 1 tablet (1 mg total) by mouth 3 (three) times daily as needed.  Marland Kitchen escitalopram (LEXAPRO) 10 MG tablet Take 3 tablets (30 mg total) by mouth daily.  . Fish Oil-Cholecalciferol (FISH OIL + D3 PO) Take by mouth. (Patient not taking: Reported on 11/15/2019)  . fluticasone (FLONASE) 50 MCG/ACT nasal spray Place 2 sprays into both nostrils daily.  . fluticasone (FLOVENT HFA) 110 MCG/ACT inhaler Inhale 2 puffs into the lungs 2 (two) times daily. (Patient taking differently: Inhale 1 puff into the lungs daily. )  . glucose blood test strip Use to test blood sugars 2 times daily.  . indomethacin (INDOCIN) 25 MG capsule Take 1 capsule (25 mg total) by mouth 3 (three) times daily as needed.  . lithium 600 MG capsule Take 1 capsule (600 mg total) by mouth 2 (two) times daily with a meal.  . lurasidone (LATUDA) 20 MG TABS tablet Take 1 tablet (20 mg total) by mouth every evening. samples  . meloxicam (MOBIC) 15 MG tablet TAKE 1 TABLET BY MOUTH EVERY DAY  . metFORMIN (GLUCOPHAGE) 500 MG tablet Take 1 tablet (500 mg  total) by mouth 2 (two) times daily with a meal.  . mirtazapine (REMERON) 30 MG tablet Take 2 tablets (60 mg total) by mouth at bedtime.  Marland Kitchen omeprazole (PRILOSEC) 40 MG capsule Take 40 mg by mouth daily.  . traZODone (DESYREL) 100 MG tablet Take 1.5 tablets (150 mg total) by mouth at bedtime. May also take 1 tablet (100 mg total) at bedtime as needed for sleep (insomnia).  . TRUEplus Lancets 30G MISC Use to test blood sugars 2 times daily.   No facility-administered encounter medications on file as of 12/05/2019.    Current Diagnosis: Patient Active Problem List   Diagnosis Date Noted  . Chronic obstructive pulmonary disease, unspecified COPD type ([place]) 11/15/2019  . Cough 01/09/2018  . Lower resp. tract infection 01/09/2018  . Sinus congestion 01/09/2018  . Arthritis 12/22/2017  . Infertility male 12/22/2017  . Asthma 10/11/2017  . Chronic rhinitis 10/11/2017  . Psychophysiological insomnia 08/30/2017  . Major depressive disorder in partial remission ([place]) 08/30/2017  . Anxiety and depression 08/30/2017  . Chronically on benzodiazepine therapy 08/30/2017  . Hepatic steatosis 07/12/2017  . Vitamin D deficiency 07/12/2017  . Idiopathic hematuria 07/12/2017  . Essential hypertension 07/12/2017  . Hyperlipidemia LDL goal <100 07/12/2017  . Type 2 diabetes mellitus with complication, without long-term current use of insulin ([place]) 07/12/2017  . Diverticulitis of large intestine 01/29/2014  . PTSD (post-traumatic stress disorder) 01/22/2014  . Insomnia related to another  mental disorder 01/22/2014  . GAD (generalized anxiety disorder) 01/22/2014  . Skin-picking disorder 01/22/2014  . Bipolar 2 disorder, major depressive episode ([place]) 01/22/2014  . Benign prostatic hyperplasia 05/03/2011  . Hemorrhoids, internal 05/03/2011  . Male erectile disorder 05/03/2011  . Bipolar affective ([place]) 04/02/2011  . Diverticulitis 04/02/2011  . GERD (gastroesophageal reflux disease) 04/02/2011  . IBS  (irritable bowel syndrome) 04/02/2011  . Osteoarthritis of shoulder 04/02/2011  . Snoring 04/02/2011  . Tachycardia 04/02/2011    Goals Addressed   None     Follow-Up:  Pharmacist Review   Reviewed chart prior to disease state call. Spoke with patient regarding BP  Recent Office Vitals: BP Readings from Last 3 Encounters:  11/15/19 108/90  07/26/19 119/86  01/26/19 126/88   Pulse Readings from Last 3 Encounters:  11/15/19 78  07/26/19 83  01/26/19 76    Wt Readings from Last 3 Encounters:  11/15/19 166 lb 9.6 oz (75.6 kg)  01/26/19 180 lb 6.4 oz (81.8 kg)  10/27/18 180 lb 6.4 oz (81.8 kg)     Kidney Function Lab Results  Component Value Date/Time   CREATININE 0.97 11/15/2019 09:20 AM   CREATININE 0.85 10/27/2018 09:21 AM   GFR 78.82 11/15/2019 09:20 AM   GFRNONAA 94 10/31/2017 02:35 PM   GFRAA 109 10/31/2017 02:35 PM    BMP Latest Ref Rng & Units 11/15/2019 10/27/2018 10/31/2017  Glucose 70 - 99 mg/dL 120(H) 114(H) 170(H)  BUN 6 - 23 mg/dL 16 15 17   Creatinine 0.40 - 1.50 mg/dL 0.97 0.85 0.89  BUN/Creat Ratio 9 - 20 - - 19  Sodium 135 - 145 mEq/L 141 139 140  Potassium 3.5 - 5.1 mEq/L 4.7 4.5 3.9  Chloride 96 - 112 mEq/L 107 105 103  CO2 19 - 32 mEq/L 26 26 23   Calcium 8.4 - 10.5 mg/dL 9.9 9.7 9.5   . Current antihypertensive regimen:  o Patient stated he is taking Atenolol 25 mg daily at bedtime  . How often are you checking your Blood Pressure? 3-5x per week . Current home BP readings:  o 11/30/2019 at  7:45 am - 136/82 o 12/03/2019 at 9:00 am - 121/74 o 12/05/2019 at  8:15 am - 125/83  . What recent interventions/DTPs have been made by any provider to improve Blood Pressure control since last CPP Visit: None ID  . Any recent hospitalizations or ED visits since last visit with CPP? No . What diet changes have been made to improve Blood Pressure Control?  o Patient stated he does home cook food and eats out as well. Patient stated some of his home cook  foods include: - coffee ,scrambled eggs,chicken salad with wheat bread, baked chicken,hamburger, pork, one or two vegetables Patient reports he uses salt on his food (salt to taste).  . What exercise is being done to improve your Blood Pressure Control?  o Patient stated he walks one mile every other day   Adherence Review: Is the patient currently on ACE/ARB medication? No Does the patient have >5 day gap between last estimated fill dates? Yes   [place] Pharmacist Assistant [phone]   Maryjean Ka

## 2019-12-06 DIAGNOSIS — F4310 Post-traumatic stress disorder, unspecified: Secondary | ICD-10-CM | POA: Diagnosis not present

## 2019-12-10 DIAGNOSIS — F4310 Post-traumatic stress disorder, unspecified: Secondary | ICD-10-CM | POA: Diagnosis not present

## 2019-12-12 DIAGNOSIS — Z1212 Encounter for screening for malignant neoplasm of rectum: Secondary | ICD-10-CM | POA: Diagnosis not present

## 2019-12-12 DIAGNOSIS — Z1211 Encounter for screening for malignant neoplasm of colon: Secondary | ICD-10-CM | POA: Diagnosis not present

## 2019-12-13 DIAGNOSIS — F4310 Post-traumatic stress disorder, unspecified: Secondary | ICD-10-CM | POA: Diagnosis not present

## 2019-12-13 LAB — COLOGUARD: Cologuard: NEGATIVE

## 2019-12-17 DIAGNOSIS — F4310 Post-traumatic stress disorder, unspecified: Secondary | ICD-10-CM | POA: Diagnosis not present

## 2019-12-18 LAB — COLOGUARD: COLOGUARD: NEGATIVE

## 2019-12-19 ENCOUNTER — Encounter: Payer: Self-pay | Admitting: Family Medicine

## 2019-12-20 ENCOUNTER — Telehealth (INDEPENDENT_AMBULATORY_CARE_PROVIDER_SITE_OTHER): Payer: Medicare PPO | Admitting: Psychiatry

## 2019-12-20 ENCOUNTER — Other Ambulatory Visit: Payer: Self-pay

## 2019-12-20 DIAGNOSIS — F3181 Bipolar II disorder: Secondary | ICD-10-CM | POA: Diagnosis not present

## 2019-12-20 DIAGNOSIS — F4310 Post-traumatic stress disorder, unspecified: Secondary | ICD-10-CM | POA: Diagnosis not present

## 2019-12-20 DIAGNOSIS — F5105 Insomnia due to other mental disorder: Secondary | ICD-10-CM | POA: Diagnosis not present

## 2019-12-20 DIAGNOSIS — F424 Excoriation (skin-picking) disorder: Secondary | ICD-10-CM

## 2019-12-20 DIAGNOSIS — F411 Generalized anxiety disorder: Secondary | ICD-10-CM | POA: Diagnosis not present

## 2019-12-22 MED ORDER — MIRTAZAPINE 30 MG PO TABS: 60.0000 mg | ORAL_TABLET | Freq: Every day | ORAL | 3 refills | Status: DC

## 2019-12-22 MED ORDER — TRAZODONE HCL 100 MG PO TABS: ORAL_TABLET | ORAL | 0 refills | Status: DC

## 2019-12-22 MED ORDER — LATUDA 20 MG PO TABS: 20.0000 mg | ORAL_TABLET | Freq: Every evening | ORAL | 0 refills | Status: DC

## 2019-12-22 MED ORDER — ESCITALOPRAM OXALATE 10 MG PO TABS: 30.0000 mg | ORAL_TABLET | Freq: Every day | ORAL | 0 refills | Status: DC

## 2019-12-22 MED ORDER — LITHIUM CARBONATE 600 MG PO CAPS: 600.0000 mg | ORAL_CAPSULE | Freq: Two times a day (BID) | ORAL | 0 refills | Status: DC

## 2019-12-22 MED ORDER — CLONAZEPAM 1 MG PO TBDP: 1.0000 mg | ORAL_TABLET | Freq: Three times a day (TID) | ORAL | 2 refills | Status: DC | PRN

## 2019-12-22 NOTE — Progress Notes (Signed)
Virtual Visit via Telephone Note  I connected with Matthew Ortiz. on 12/22/19 at  3:30 PM EDT by telephone and verified that I am speaking with the correct person using two identifiers.  Location: Patient: home  Provider: office   I discussed the limitations, risks, security and privacy concerns of performing an evaluation and management service by telephone and the availability of in person appointments. I also discussed with the patient that there may be a patient responsible charge related to this service. The patient expressed understanding and agreed to proceed.   History of Present Illness: Matthew Ortiz reports that he has been stressed.  His wife's health continues to decline and he is managing most of the errands and chores.  And caring for her and himself he feels overwhelmed.  Due to the market they have not been able to find a home to move into and he is rethinking his decision.  Matthew Ortiz has not been able to stand as much time with his friends as he was in the past.  His depression and anxiety are ongoing.  He has a new neighbor who he has not yet spoken with.  Matthew Ortiz is anxious and does not have a good feeling about the new neighbors.  His PTSD is playing a part in this.  He is denying any manic or hypomanic-like symptoms.  Matthew Ortiz is denying any skin picking and is very proud of the conditions of his hands.  Matthew Ortiz denies SI/HI.  He has been receiving Latuda samples due to cost.  He states the medication is very effective but he does not want to continue taking samples as it is a hassle.   Observations/Objective:  General Appearance: unable to assess  Eye Contact:  unable to assess  Speech:  Clear and Coherent and Normal Rate  Volume:  Normal  Mood:  Anxious and Depressed  Affect:  Congruent  Thought Process:  Coherent and Descriptions of Associations: Circumstantial  Orientation:  Full (Time, Place, and Person)  Thought Content:  Paranoid Ideation  Suicidal Thoughts:  No  Homicidal  Thoughts:  No  Memory:  Immediate;   Good  Judgement:  Good  Insight:  Good  Psychomotor Activity: unable to assess  Concentration:  Concentration: Good  Recall:  Good  Fund of Knowledge:  Good  Language:  Good  Akathisia:  unable to assess  Handed:  Right  AIMS (if indicated):     Assets:  Communication Skills Desire for Improvement Financial Resources/Insurance Housing Resilience Talents/Skills Transportation Vocational/Educational  ADL's:  unable to assess  Cognition:  WNL  Sleep:         Assessment and Plan: PTSD; bipolar 2 disorder-current episode depressed; GAD; skin picking disorder; insomnia  Continue: 1.  Latuda 20 mg p.o. nightly-due to cost patient will attempt to get the medication from the [place] 2.  Lithium 600 mg p.o. twice daily 3.  Klonopin 1 mg p.o. 3 times daily 4.  Remeron 60 mg p.o. nightly 5.  Lexapro 30 mg p.o. daily-patient aware that dose is above FDA recommended.  He has supporting benefits and denies side effects.  Patient would like to continue at this dose 6.  Trazodone 150 mg p.o. nightly and 100 mg as needed middle of the night insomnia 7.N -Acetylcysteine 1 tab p.o. daily for skin picking   Follow Up Instructions: In 2 to 3 months or sooner if needed   I discussed the assessment and treatment plan with the patient. The patient was provided an opportunity to  ask questions and all were answered. The patient agreed with the plan and demonstrated an understanding of the instructions.   The patient was advised to call back or seek an in-person evaluation if the symptoms worsen or if the condition fails to improve as anticipated.  I provided 30 minutes of non-face-to-face time during this encounter.   Charlcie Cradle, MD

## 2019-12-24 ENCOUNTER — Encounter: Payer: Self-pay | Admitting: Family Medicine

## 2019-12-24 DIAGNOSIS — F4310 Post-traumatic stress disorder, unspecified: Secondary | ICD-10-CM | POA: Diagnosis not present

## 2019-12-27 DIAGNOSIS — F4310 Post-traumatic stress disorder, unspecified: Secondary | ICD-10-CM | POA: Diagnosis not present

## 2019-12-31 DIAGNOSIS — F4310 Post-traumatic stress disorder, unspecified: Secondary | ICD-10-CM | POA: Diagnosis not present

## 2020-01-03 DIAGNOSIS — F4310 Post-traumatic stress disorder, unspecified: Secondary | ICD-10-CM | POA: Diagnosis not present

## 2020-01-07 DIAGNOSIS — F4310 Post-traumatic stress disorder, unspecified: Secondary | ICD-10-CM | POA: Diagnosis not present

## 2020-01-10 DIAGNOSIS — F4310 Post-traumatic stress disorder, unspecified: Secondary | ICD-10-CM | POA: Diagnosis not present

## 2020-01-14 DIAGNOSIS — F4310 Post-traumatic stress disorder, unspecified: Secondary | ICD-10-CM | POA: Diagnosis not present

## 2020-01-15 ENCOUNTER — Other Ambulatory Visit (HOSPITAL_COMMUNITY): Payer: Self-pay | Admitting: Psychiatry

## 2020-01-15 DIAGNOSIS — F5105 Insomnia due to other mental disorder: Secondary | ICD-10-CM

## 2020-01-17 DIAGNOSIS — F4310 Post-traumatic stress disorder, unspecified: Secondary | ICD-10-CM | POA: Diagnosis not present

## 2020-01-21 DIAGNOSIS — F4310 Post-traumatic stress disorder, unspecified: Secondary | ICD-10-CM | POA: Diagnosis not present

## 2020-01-23 ENCOUNTER — Telehealth: Payer: Medicare PPO
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
## 2020-01-24 DIAGNOSIS — F4310 Post-traumatic stress disorder, unspecified: Secondary | ICD-10-CM | POA: Diagnosis not present

## 2020-01-28 DIAGNOSIS — F4310 Post-traumatic stress disorder, unspecified: Secondary | ICD-10-CM | POA: Diagnosis not present

## 2020-01-30 ENCOUNTER — Telehealth: Payer: Self-pay

## 2020-01-30 NOTE — Progress Notes (Signed)
Completed a medication cost analysis for patient medications.  [place] Pharmacist Assistant [phone]

## 2020-02-04 DIAGNOSIS — F4310 Post-traumatic stress disorder, unspecified: Secondary | ICD-10-CM | POA: Diagnosis not present

## 2020-02-07 DIAGNOSIS — F4310 Post-traumatic stress disorder, unspecified: Secondary | ICD-10-CM | POA: Diagnosis not present

## 2020-02-11 DIAGNOSIS — F4310 Post-traumatic stress disorder, unspecified: Secondary | ICD-10-CM | POA: Diagnosis not present

## 2020-02-18 DIAGNOSIS — F4310 Post-traumatic stress disorder, unspecified: Secondary | ICD-10-CM | POA: Diagnosis not present

## 2020-02-21 ENCOUNTER — Other Ambulatory Visit: Payer: Self-pay

## 2020-02-21 ENCOUNTER — Telehealth (HOSPITAL_COMMUNITY): Payer: Medicare PPO | Admitting: Psychiatry

## 2020-02-21 ENCOUNTER — Other Ambulatory Visit (HOSPITAL_COMMUNITY): Payer: Self-pay | Admitting: Psychiatry

## 2020-02-21 DIAGNOSIS — F5105 Insomnia due to other mental disorder: Secondary | ICD-10-CM

## 2020-02-21 DIAGNOSIS — F4310 Post-traumatic stress disorder, unspecified: Secondary | ICD-10-CM

## 2020-02-21 DIAGNOSIS — F411 Generalized anxiety disorder: Secondary | ICD-10-CM

## 2020-02-21 DIAGNOSIS — F3181 Bipolar II disorder: Secondary | ICD-10-CM

## 2020-02-21 DIAGNOSIS — F424 Excoriation (skin-picking) disorder: Secondary | ICD-10-CM

## 2020-02-21 MED ORDER — LATUDA 20 MG PO TABS: 20.0000 mg | ORAL_TABLET | Freq: Every evening | ORAL | 0 refills | Status: DC

## 2020-02-21 MED ORDER — CLONAZEPAM 1 MG PO TBDP: 1.0000 mg | ORAL_TABLET | Freq: Three times a day (TID) | ORAL | 3 refills | Status: DC | PRN

## 2020-02-21 MED ORDER — LITHIUM CARBONATE 600 MG PO CAPS: 600.0000 mg | ORAL_CAPSULE | Freq: Two times a day (BID) | ORAL | 0 refills | Status: DC

## 2020-02-21 MED ORDER — MIRTAZAPINE 30 MG PO TABS: 60.0000 mg | ORAL_TABLET | Freq: Every day | ORAL | 3 refills | Status: DC

## 2020-02-21 MED ORDER — TRAZODONE HCL 100 MG PO TABS: ORAL_TABLET | ORAL | 0 refills | Status: DC

## 2020-02-21 MED ORDER — ESCITALOPRAM OXALATE 10 MG PO TABS: 30.0000 mg | ORAL_TABLET | Freq: Every day | ORAL | 0 refills | Status: DC

## 2020-02-21 MED FILL — CLONAZEPAM 1 MG TBDP: 1 | 20 days supply | Qty: 60 | Fill #0

## 2020-02-21 NOTE — Progress Notes (Unsigned)
Virtual Visit via Telephone Note  I connected with Matthew Ortiz. on 02/22/20 at  1:30 PM EDT by telephone and verified that I am speaking with the correct person using two identifiers.  Location: Patient: *** Provider: ***   I discussed the limitations, risks, security and privacy concerns of performing an evaluation and management service by telephone and the availability of in person appointments. I also discussed with the patient that there may be a patient responsible charge related to this service. The patient expressed understanding and agreed to proceed.   History of Present Illness:  Hanging in there.  His wife had gallbladder surgery and she is in recovery. The reality he is working hard to rage care of her mental health. Today Matthew Ortiz admits his depression and anxiety are worse. He sometimes wakes up with a panic attack. Obviously he is frustrated. Matthew Ortiz has not been able to do go out and see his friends. He denies SI/HI. He is using active coping skills and gaining support from his a dogs.     Observations/Objective:  General Appearance: unable to assess  Eye Contact:  unable to assess  Speech:  {Speech:22685}  Volume:  {Volume (PAA):22686}  Mood:  {BHH MOOD:22306}  Affect:  {Affect (PAA):22687}  Thought Process:  {Thought Process (PAA):22688}  Orientation:  {BHH ORIENTATION (PAA):22689}  Thought Content:  {Thought Content:22690}  Suicidal Thoughts:  {ST/HT (PAA):22692}  Homicidal Thoughts:  {ST/HT (PAA):22692}  Memory:  {BHH MEMORY:22881}  Judgement:  {Judgement (PAA):22694}  Insight:  {Insight (PAA):22695}  Psychomotor Activity: unable to assess  Concentration:  {Concentration:21399}  Recall:  {BHH GOOD/FAIR/POOR:22877}  Fund of Knowledge:  {BHH GOOD/FAIR/POOR:22877}  Language:  {BHH GOOD/FAIR/POOR:22877}  Akathisia:  unable to assess  Handed:  {Handed:22697}  AIMS (if indicated):     Assets:  {Assets (PAA):22698}  ADL's:  unable to assess  Cognition:   {chl bhh cognition:304700322}  Sleep:         Assessment and Plan: Continue current meds  Follow Up Instructions: In 2-3 months or sooner if needed   I discussed the assessment and treatment plan with the patient. The patient was provided an opportunity to ask questions and all were answered. The patient agreed with the plan and demonstrated an understanding of the instructions.   The patient was advised to call back or seek an in-person evaluation if the symptoms worsen or if the condition fails to improve as anticipated.  I provided 20 minutes of non-face-to-face time during this encounter.   Charlcie Cradle, MD n

## 2020-02-25 DIAGNOSIS — F4310 Post-traumatic stress disorder, unspecified: Secondary | ICD-10-CM | POA: Diagnosis not present

## 2020-02-28 DIAGNOSIS — F4310 Post-traumatic stress disorder, unspecified: Secondary | ICD-10-CM | POA: Diagnosis not present

## 2020-02-29 ENCOUNTER — Telehealth: Payer: Self-pay | Admitting: Family Medicine

## 2020-02-29 NOTE — Telephone Encounter (Signed)
Left message for patient to schedule Annual Wellness Visit.  Please schedule with Nurse Health Advisor Martha Stanley, RN at [facility]  °

## 2020-03-03 DIAGNOSIS — F4310 Post-traumatic stress disorder, unspecified: Secondary | ICD-10-CM | POA: Diagnosis not present

## 2020-03-06 DIAGNOSIS — F4310 Post-traumatic stress disorder, unspecified: Secondary | ICD-10-CM | POA: Diagnosis not present

## 2020-03-12 ENCOUNTER — Other Ambulatory Visit: Payer: Self-pay | Admitting: Family Medicine

## 2020-03-12 DIAGNOSIS — E118 Type 2 diabetes mellitus with unspecified complications: Secondary | ICD-10-CM

## 2020-03-12 NOTE — Telephone Encounter (Signed)
Last OV 11/15/19 Last fill 03/21/19  #90/3

## 2020-03-13 DIAGNOSIS — F4310 Post-traumatic stress disorder, unspecified: Secondary | ICD-10-CM | POA: Diagnosis not present

## 2020-03-16 ENCOUNTER — Other Ambulatory Visit (HOSPITAL_COMMUNITY): Payer: Self-pay | Admitting: Psychiatry

## 2020-03-16 DIAGNOSIS — F3181 Bipolar II disorder: Secondary | ICD-10-CM

## 2020-03-17 DIAGNOSIS — F4310 Post-traumatic stress disorder, unspecified: Secondary | ICD-10-CM | POA: Diagnosis not present

## 2020-03-19 DIAGNOSIS — F4310 Post-traumatic stress disorder, unspecified: Secondary | ICD-10-CM | POA: Diagnosis not present

## 2020-03-20 DIAGNOSIS — N401 Enlarged prostate with lower urinary tract symptoms: Secondary | ICD-10-CM | POA: Diagnosis not present

## 2020-03-20 DIAGNOSIS — J45909 Unspecified asthma, uncomplicated: Secondary | ICD-10-CM | POA: Diagnosis not present

## 2020-03-20 DIAGNOSIS — F319 Bipolar disorder, unspecified: Secondary | ICD-10-CM | POA: Diagnosis not present

## 2020-03-20 DIAGNOSIS — E119 Type 2 diabetes mellitus without complications: Secondary | ICD-10-CM | POA: Diagnosis not present

## 2020-03-20 DIAGNOSIS — I10 Essential (primary) hypertension: Secondary | ICD-10-CM | POA: Diagnosis not present

## 2020-03-20 DIAGNOSIS — K219 Gastro-esophageal reflux disease without esophagitis: Secondary | ICD-10-CM | POA: Diagnosis not present

## 2020-03-20 DIAGNOSIS — E7800 Pure hypercholesterolemia, unspecified: Secondary | ICD-10-CM | POA: Diagnosis not present

## 2020-03-20 DIAGNOSIS — E559 Vitamin D deficiency, unspecified: Secondary | ICD-10-CM | POA: Diagnosis not present

## 2020-03-25 ENCOUNTER — Other Ambulatory Visit (HOSPITAL_COMMUNITY): Payer: Self-pay | Admitting: Psychiatry

## 2020-03-25 DIAGNOSIS — F424 Excoriation (skin-picking) disorder: Secondary | ICD-10-CM

## 2020-03-25 DIAGNOSIS — F4310 Post-traumatic stress disorder, unspecified: Secondary | ICD-10-CM | POA: Diagnosis not present

## 2020-03-25 DIAGNOSIS — F411 Generalized anxiety disorder: Secondary | ICD-10-CM

## 2020-03-31 DIAGNOSIS — F4310 Post-traumatic stress disorder, unspecified: Secondary | ICD-10-CM | POA: Diagnosis not present

## 2020-04-03 DIAGNOSIS — F4310 Post-traumatic stress disorder, unspecified: Secondary | ICD-10-CM | POA: Diagnosis not present

## 2020-04-06 ENCOUNTER — Other Ambulatory Visit: Payer: Self-pay | Admitting: Family Medicine

## 2020-04-07 DIAGNOSIS — F4310 Post-traumatic stress disorder, unspecified: Secondary | ICD-10-CM | POA: Diagnosis not present

## 2020-04-07 NOTE — Telephone Encounter (Signed)
Last OV 11/15/19 Last fill 10/11/19  #90/1

## 2020-04-10 DIAGNOSIS — F4310 Post-traumatic stress disorder, unspecified: Secondary | ICD-10-CM | POA: Diagnosis not present

## 2020-04-14 DIAGNOSIS — M1711 Unilateral primary osteoarthritis, right knee: Secondary | ICD-10-CM | POA: Diagnosis not present

## 2020-04-14 DIAGNOSIS — F4310 Post-traumatic stress disorder, unspecified: Secondary | ICD-10-CM | POA: Diagnosis not present

## 2020-04-17 DIAGNOSIS — F4310 Post-traumatic stress disorder, unspecified: Secondary | ICD-10-CM | POA: Diagnosis not present

## 2020-04-18 ENCOUNTER — Other Ambulatory Visit (HOSPITAL_COMMUNITY): Payer: Self-pay | Admitting: Psychiatry

## 2020-04-18 DIAGNOSIS — F5105 Insomnia due to other mental disorder: Secondary | ICD-10-CM

## 2020-04-21 DIAGNOSIS — F4310 Post-traumatic stress disorder, unspecified: Secondary | ICD-10-CM | POA: Diagnosis not present

## 2020-04-21 DIAGNOSIS — R17 Unspecified jaundice: Secondary | ICD-10-CM | POA: Diagnosis not present

## 2020-05-01 DIAGNOSIS — F4310 Post-traumatic stress disorder, unspecified: Secondary | ICD-10-CM | POA: Diagnosis not present

## 2020-05-08 ENCOUNTER — Telehealth (HOSPITAL_COMMUNITY): Payer: Medicare PPO | Admitting: Psychiatry

## 2020-05-08 ENCOUNTER — Other Ambulatory Visit: Payer: Self-pay

## 2020-05-08 DIAGNOSIS — F424 Excoriation (skin-picking) disorder: Secondary | ICD-10-CM

## 2020-05-08 DIAGNOSIS — F5105 Insomnia due to other mental disorder: Secondary | ICD-10-CM

## 2020-05-08 DIAGNOSIS — F411 Generalized anxiety disorder: Secondary | ICD-10-CM

## 2020-05-08 DIAGNOSIS — F4310 Post-traumatic stress disorder, unspecified: Secondary | ICD-10-CM | POA: Diagnosis not present

## 2020-05-08 DIAGNOSIS — F3181 Bipolar II disorder: Secondary | ICD-10-CM

## 2020-05-08 MED ORDER — TRAZODONE HCL 100 MG PO TABS
ORAL_TABLET | ORAL | 0 refills | Status: DC
Start: 2020-05-08 — End: 2020-07-10

## 2020-05-08 MED ORDER — CLONAZEPAM 1 MG PO TBDP: 1.0000 mg | ORAL_TABLET | Freq: Three times a day (TID) | ORAL | 3 refills | Status: DC | PRN

## 2020-05-08 MED ORDER — LATUDA 20 MG PO TABS: 20.0000 mg | ORAL_TABLET | Freq: Every evening | ORAL | 0 refills | Status: DC

## 2020-05-08 MED ORDER — ESCITALOPRAM OXALATE 10 MG PO TABS: 30.0000 mg | ORAL_TABLET | Freq: Every day | ORAL | 0 refills | Status: DC

## 2020-05-08 MED ORDER — LITHIUM CARBONATE 600 MG PO CAPS
600.0000 mg | ORAL_CAPSULE | Freq: Two times a day (BID) | ORAL | 0 refills | Status: DC
Start: 2020-05-08 — End: 2020-07-10

## 2020-05-08 MED ORDER — MIRTAZAPINE 30 MG PO TABS
60.0000 mg | ORAL_TABLET | Freq: Every day | ORAL | 0 refills | Status: DC
Start: 2020-05-08 — End: 2020-07-10

## 2020-05-08 NOTE — Progress Notes (Unsigned)
Virtual Visit via Telephone Note  I connected with Matthew Merl. on 05/08/20 at  3:30 PM EST by telephone and verified that I am speaking with the correct person using two identifiers.  Location: Patient: home Provider: office   I discussed the limitations, risks, security and privacy concerns of performing an evaluation and management service by telephone and the availability of in person appointments. I also discussed with the patient that there may be a patient responsible charge related to this service. The patient expressed understanding and agreed to proceed.   History of Present Illness: Matthew Ortiz states he has been feeling sick for a little over a week. He is getting a COVID test in a few days. This has been a rough few weeks. Both his dogs passed within 10 days of each other. He misses both of them. They recently adopted a new dog and it is has been adjustment for them all. This whole thing has caused him to feel a little more depressed. Most days his depression level is 7/10 (10 being the worst). He is having some building conflict with his new neighbors. [name] PTSD did spike when he was stressed recently. During this time he has increased anger, intrusive memories and HV. He denies any manic or hypomanic like symptoms. He denies SI/HI.    Observations/Objective:  General Appearance: unable to assess  Eye Contact:  unable to assess  Speech:  {Speech:22685}  Volume:  {Volume (PAA):22686}  Mood:  {BHH MOOD:22306}  Affect:  {Affect (PAA):22687}  Thought Process:  {Thought Process (PAA):22688}  Orientation:  {BHH ORIENTATION (PAA):22689}  Thought Content:  {Thought Content:22690}  Suicidal Thoughts:  {ST/HT (PAA):22692}  Homicidal Thoughts:  {ST/HT (PAA):22692}  Memory:  {BHH MEMORY:22881}  Judgement:  {Judgement (PAA):22694}  Insight:  {Insight (PAA):22695}  Psychomotor Activity: unable to assess  Concentration:  {Concentration:21399}  Recall:  {BHH GOOD/FAIR/POOR:22877}   Fund of Knowledge:  {BHH GOOD/FAIR/POOR:22877}  Language:  {BHH GOOD/FAIR/POOR:22877}  Akathisia:  unable to assess  Handed:  {Handed:22697}  AIMS (if indicated):     Assets:  {Assets (PAA):22698}  ADL's:  unable to assess  Cognition:  {chl bhh cognition:304700322}  Sleep:         Assessment and Plan:  1. Skin-picking disorder - escitalopram (LEXAPRO) 10 MG tablet; Take 3 tablets (30 mg total) by mouth daily.  Dispense: 270 tablet; Refill: 0  2. PTSD (post-traumatic stress disorder) - clonazePAM (KLONOPIN) 1 MG disintegrating tablet; Take 1 tablet (1 mg total) by mouth 3 (three) times daily as needed.  Dispense: 60 tablet; Refill: 3 - escitalopram (LEXAPRO) 10 MG tablet; Take 3 tablets (30 mg total) by mouth daily.  Dispense: 270 tablet; Refill: 0 - mirtazapine (REMERON) 30 MG tablet; Take 2 tablets (60 mg total) by mouth at bedtime.  Dispense: 180 tablet; Refill: 0  3. GAD (generalized anxiety disorder) - clonazePAM (KLONOPIN) 1 MG disintegrating tablet; Take 1 tablet (1 mg total) by mouth 3 (three) times daily as needed.  Dispense: 60 tablet; Refill: 3 - escitalopram (LEXAPRO) 10 MG tablet; Take 3 tablets (30 mg total) by mouth daily.  Dispense: 270 tablet; Refill: 0 - mirtazapine (REMERON) 30 MG tablet; Take 2 tablets (60 mg total) by mouth at bedtime.  Dispense: 180 tablet; Refill: 0  4. Bipolar II disorder (HCC) - lithium 600 MG capsule; Take 1 capsule (600 mg total) by mouth 2 (two) times daily with a meal.  Dispense: 180 capsule; Refill: 0 - lurasidone (LATUDA) 20 MG TABS tablet; Take 1 tablet (20  mg total) by mouth every evening.  Dispense: 90 tablet; Refill: 0  5. Insomnia related to another mental disorder - mirtazapine (REMERON) 30 MG tablet; Take 2 tablets (60 mg total) by mouth at bedtime.  Dispense: 180 tablet; Refill: 0 - traZODone (DESYREL) 100 MG tablet; TAKE ONE &ONE HALF TABLET BY MOUTH AT BEDTIME. MAY ALSO TAKE 1 AT BEDTIME AS NEEDED FOR INSOMNIA  Dispense: 225  tablet; Refill: 0    Encouraged to continue therpay  Follow Up Instructions: In 2-3 months or sooner if needed   I discussed the assessment and treatment plan with the patient. The patient was provided an opportunity to ask questions and all were answered. The patient agreed with the plan and demonstrated an understanding of the instructions.   The patient was advised to call back or seek an in-person evaluation if the symptoms worsen or if the condition fails to improve as anticipated.  I provided 25 minutes of non-face-to-face time during this encounter.   Charlcie Cradle, MD

## 2020-05-12 DIAGNOSIS — F4310 Post-traumatic stress disorder, unspecified: Secondary | ICD-10-CM | POA: Diagnosis not present

## 2020-05-15 DIAGNOSIS — F4310 Post-traumatic stress disorder, unspecified: Secondary | ICD-10-CM | POA: Diagnosis not present

## 2020-05-16 DIAGNOSIS — R1909 Other intra-abdominal and pelvic swelling, mass and lump: Secondary | ICD-10-CM | POA: Diagnosis not present

## 2020-05-19 ENCOUNTER — Telehealth: Payer: Self-pay

## 2020-05-19 DIAGNOSIS — F4310 Post-traumatic stress disorder, unspecified: Secondary | ICD-10-CM | POA: Diagnosis not present

## 2020-05-19 NOTE — Progress Notes (Signed)
Chronic Care Management Pharmacy Assistant   Name: Matthew Ortiz.  MRN: [id number] DOB: 11-07-1960  Reason for Encounter: Medication Review/General Adherence call.  Patient Questions:  1.  Have you seen any other providers since your last visit? No  2.  Any changes in your medicines or health? No     PCP : [name], DO  Allergies:   Allergies  Allergen Reactions  . Bee Pollen   . Milk-Related Compounds Diarrhea  . Penicillins Rash    Medications: Outpatient Encounter Medications as of 05/19/2020  Medication Sig  . Acetylcysteine (N-ACETYL-L-CYSTEINE) POWD 1 Units/day by Does not apply route daily.  Marland Kitchen albuterol (PROAIR HFA) 108 (90 Base) MCG/ACT inhaler Inhale 1-2 puffs into the lungs every 6 (six) hours as needed for wheezing or shortness of breath.  Marland Kitchen atorvastatin (LIPITOR) 10 MG tablet TAKE 1 TABLET BY MOUTH EVERY DAY  . clonazePAM (KLONOPIN) 1 MG disintegrating tablet Take 1 tablet (1 mg total) by mouth 3 (three) times daily as needed.  Marland Kitchen escitalopram (LEXAPRO) 10 MG tablet Take 3 tablets (30 mg total) by mouth daily.  . Fish Oil-Cholecalciferol (FISH OIL + D3 PO) Take by mouth. (Patient not taking: Reported on 11/15/2019)  . fluticasone (FLONASE) 50 MCG/ACT nasal spray Place 2 sprays into both nostrils daily.  . fluticasone (FLOVENT HFA) 110 MCG/ACT inhaler Inhale 2 puffs into the lungs 2 (two) times daily. (Patient taking differently: Inhale 1 puff into the lungs daily. )  . glucose blood test strip Use to test blood sugars 2 times daily.  . indomethacin (INDOCIN) 25 MG capsule Take 1 capsule (25 mg total) by mouth 3 (three) times daily as needed.  . lithium 600 MG capsule Take 1 capsule (600 mg total) by mouth 2 (two) times daily with a meal.  . lurasidone (LATUDA) 20 MG TABS tablet Take 1 tablet (20 mg total) by mouth every evening.  . meloxicam (MOBIC) 15 MG tablet TAKE 1 TABLET BY MOUTH EVERY DAY  . metFORMIN (GLUCOPHAGE) 500 MG tablet Take 1 tablet (500  mg total) by mouth 2 (two) times daily with a meal.  . mirtazapine (REMERON) 30 MG tablet Take 2 tablets (60 mg total) by mouth at bedtime.  Marland Kitchen omeprazole (PRILOSEC) 40 MG capsule Take 40 mg by mouth daily.  . traZODone (DESYREL) 100 MG tablet TAKE ONE &ONE HALF TABLET BY MOUTH AT BEDTIME. MAY ALSO TAKE 1 AT BEDTIME AS NEEDED FOR INSOMNIA  . TRUEplus Lancets 30G MISC Use to test blood sugars 2 times daily.   No facility-administered encounter medications on file as of 05/19/2020.    Current Diagnosis: Patient Active Problem List   Diagnosis Date Noted  . Chronic obstructive pulmonary disease, unspecified COPD type ([place]) 11/15/2019  . Cough 01/09/2018  . Lower resp. tract infection 01/09/2018  . Sinus congestion 01/09/2018  . Arthritis 12/22/2017  . Infertility male 12/22/2017  . Asthma 10/11/2017  . Chronic rhinitis 10/11/2017  . Psychophysiological insomnia 08/30/2017  . Major depressive disorder in partial remission ([place]) 08/30/2017  . Anxiety and depression 08/30/2017  . Chronically on benzodiazepine therapy 08/30/2017  . Hepatic steatosis 07/12/2017  . Vitamin D deficiency 07/12/2017  . Idiopathic hematuria 07/12/2017  . Essential hypertension 07/12/2017  . Hyperlipidemia LDL goal <100 07/12/2017  . Type 2 diabetes mellitus with complication, without long-term current use of insulin ([place]) 07/12/2017  . Diverticulitis of large intestine 01/29/2014  . PTSD (post-traumatic stress disorder) 01/22/2014  . Insomnia related to another mental disorder 01/22/2014  .  GAD (generalized anxiety disorder) 01/22/2014  . Skin-picking disorder 01/22/2014  . Bipolar 2 disorder, major depressive episode ([place]) 01/22/2014  . Benign prostatic hyperplasia 05/03/2011  . Hemorrhoids, internal 05/03/2011  . Male erectile disorder 05/03/2011  . Bipolar affective ([place]) 04/02/2011  . Diverticulitis 04/02/2011  . GERD (gastroesophageal reflux disease) 04/02/2011  . IBS (irritable bowel syndrome)  04/02/2011  . Osteoarthritis of shoulder 04/02/2011  . Snoring 04/02/2011  . Tachycardia 04/02/2011    Goals Addressed   None    I have attempted without success to contact this patient by phone three times to do his General Adherence call. I left a Voice message for patient to return my call.  Left Voice message on  05/19/2020 ,05/21/2020,05/23/2020  Follow-Up:  Pharmacist Review   [place] Clinical Pharmacist Assistant [phone]

## 2020-05-22 DIAGNOSIS — F4310 Post-traumatic stress disorder, unspecified: Secondary | ICD-10-CM | POA: Diagnosis not present

## 2020-05-26 DIAGNOSIS — H43813 Vitreous degeneration, bilateral: Secondary | ICD-10-CM | POA: Diagnosis not present

## 2020-05-26 DIAGNOSIS — H5213 Myopia, bilateral: Secondary | ICD-10-CM | POA: Diagnosis not present

## 2020-05-26 DIAGNOSIS — E119 Type 2 diabetes mellitus without complications: Secondary | ICD-10-CM | POA: Diagnosis not present

## 2020-05-26 DIAGNOSIS — F4310 Post-traumatic stress disorder, unspecified: Secondary | ICD-10-CM | POA: Diagnosis not present

## 2020-05-26 DIAGNOSIS — H524 Presbyopia: Secondary | ICD-10-CM | POA: Diagnosis not present

## 2020-05-26 DIAGNOSIS — H52223 Regular astigmatism, bilateral: Secondary | ICD-10-CM | POA: Diagnosis not present

## 2020-05-26 DIAGNOSIS — H25091 Other age-related incipient cataract, right eye: Secondary | ICD-10-CM | POA: Diagnosis not present

## 2020-05-29 DIAGNOSIS — F4310 Post-traumatic stress disorder, unspecified: Secondary | ICD-10-CM | POA: Diagnosis not present

## 2020-06-02 DIAGNOSIS — I209 Angina pectoris, unspecified: Secondary | ICD-10-CM | POA: Diagnosis not present

## 2020-06-02 DIAGNOSIS — F4310 Post-traumatic stress disorder, unspecified: Secondary | ICD-10-CM | POA: Diagnosis not present

## 2020-06-02 DIAGNOSIS — I10 Essential (primary) hypertension: Secondary | ICD-10-CM | POA: Diagnosis not present

## 2020-06-02 DIAGNOSIS — R0600 Dyspnea, unspecified: Secondary | ICD-10-CM | POA: Diagnosis not present

## 2020-06-02 DIAGNOSIS — R9431 Abnormal electrocardiogram [ECG] [EKG]: Secondary | ICD-10-CM | POA: Diagnosis not present

## 2020-06-02 DIAGNOSIS — I739 Peripheral vascular disease, unspecified: Secondary | ICD-10-CM | POA: Diagnosis not present

## 2020-06-02 DIAGNOSIS — E785 Hyperlipidemia, unspecified: Secondary | ICD-10-CM | POA: Diagnosis not present

## 2020-06-05 DIAGNOSIS — F4310 Post-traumatic stress disorder, unspecified: Secondary | ICD-10-CM | POA: Diagnosis not present

## 2020-06-09 DIAGNOSIS — F4310 Post-traumatic stress disorder, unspecified: Secondary | ICD-10-CM | POA: Diagnosis not present

## 2020-06-10 DIAGNOSIS — I739 Peripheral vascular disease, unspecified: Secondary | ICD-10-CM | POA: Diagnosis not present

## 2020-06-12 DIAGNOSIS — F4310 Post-traumatic stress disorder, unspecified: Secondary | ICD-10-CM | POA: Diagnosis not present

## 2020-06-13 ENCOUNTER — Telehealth: Payer: Self-pay

## 2020-06-13 NOTE — Progress Notes (Signed)
Chronic Care Management Pharmacy Assistant   Name: Iyad Deroo.  MRN: [id number] DOB: [birth year]  Reason for Encounter: Medication Review /General Adherence Call Patient Questions:  1.  Have you seen any other providers since your last visit? No  2.  Any changes in your medicines or health? No     PCP : [name], DO  Allergies:   Allergies  Allergen Reactions  . Bee Pollen   . Milk-Related Compounds Diarrhea  . Penicillins Rash    Medications: Outpatient Encounter Medications as of 06/13/2020  Medication Sig  . Acetylcysteine (N-ACETYL-L-CYSTEINE) POWD 1 Units/day by Does not apply route daily.  Marland Kitchen albuterol (PROAIR HFA) 108 (90 Base) MCG/ACT inhaler Inhale 1-2 puffs into the lungs every 6 (six) hours as needed for wheezing or shortness of breath.  Marland Kitchen atorvastatin (LIPITOR) 10 MG tablet TAKE 1 TABLET BY MOUTH EVERY DAY  . clonazePAM (KLONOPIN) 1 MG disintegrating tablet Take 1 tablet (1 mg total) by mouth 3 (three) times daily as needed.  Marland Kitchen escitalopram (LEXAPRO) 10 MG tablet Take 3 tablets (30 mg total) by mouth daily.  . Fish Oil-Cholecalciferol (FISH OIL + D3 PO) Take by mouth. (Patient not taking: Reported on 11/15/2019)  . fluticasone (FLONASE) 50 MCG/ACT nasal spray Place 2 sprays into both nostrils daily.  . fluticasone (FLOVENT HFA) 110 MCG/ACT inhaler Inhale 2 puffs into the lungs 2 (two) times daily. (Patient taking differently: Inhale 1 puff into the lungs daily. )  . glucose blood test strip Use to test blood sugars 2 times daily.  . indomethacin (INDOCIN) 25 MG capsule Take 1 capsule (25 mg total) by mouth 3 (three) times daily as needed.  . lithium 600 MG capsule Take 1 capsule (600 mg total) by mouth 2 (two) times daily with a meal.  . lurasidone (LATUDA) 20 MG TABS tablet Take 1 tablet (20 mg total) by mouth every evening.  . meloxicam (MOBIC) 15 MG tablet TAKE 1 TABLET BY MOUTH EVERY DAY  . metFORMIN (GLUCOPHAGE) 500 MG tablet Take 1 tablet (500  mg total) by mouth 2 (two) times daily with a meal.  . mirtazapine (REMERON) 30 MG tablet Take 2 tablets (60 mg total) by mouth at bedtime.  Marland Kitchen omeprazole (PRILOSEC) 40 MG capsule Take 40 mg by mouth daily.  . traZODone (DESYREL) 100 MG tablet TAKE ONE &ONE HALF TABLET BY MOUTH AT BEDTIME. MAY ALSO TAKE 1 AT BEDTIME AS NEEDED FOR INSOMNIA  . TRUEplus Lancets 30G MISC Use to test blood sugars 2 times daily.   No facility-administered encounter medications on file as of 06/13/2020.    Current Diagnosis: Patient Active Problem List   Diagnosis Date Noted  . Chronic obstructive pulmonary disease, unspecified COPD type ([place]) 11/15/2019  . Cough 01/09/2018  . Lower resp. tract infection 01/09/2018  . Sinus congestion 01/09/2018  . Arthritis 12/22/2017  . Infertility male 12/22/2017  . Asthma 10/11/2017  . Chronic rhinitis 10/11/2017  . Psychophysiological insomnia 08/30/2017  . Major depressive disorder in partial remission ([place]) 08/30/2017  . Anxiety and depression 08/30/2017  . Chronically on benzodiazepine therapy 08/30/2017  . Hepatic steatosis 07/12/2017  . Vitamin D deficiency 07/12/2017  . Idiopathic hematuria 07/12/2017  . Essential hypertension 07/12/2017  . Hyperlipidemia LDL goal <100 07/12/2017  . Type 2 diabetes mellitus with complication, without long-term current use of insulin ([place]) 07/12/2017  . Diverticulitis of large intestine 01/29/2014  . PTSD (post-traumatic stress disorder) 01/22/2014  . Insomnia related to another mental disorder 01/22/2014  .  GAD (generalized anxiety disorder) 01/22/2014  . Skin-picking disorder 01/22/2014  . Bipolar 2 disorder, major depressive episode ([place]) 01/22/2014  . Benign prostatic hyperplasia 05/03/2011  . Hemorrhoids, internal 05/03/2011  . Male erectile disorder 05/03/2011  . Bipolar affective ([place]) 04/02/2011  . Diverticulitis 04/02/2011  . GERD (gastroesophageal reflux disease) 04/02/2011  . IBS (irritable bowel syndrome)  04/02/2011  . Osteoarthritis of shoulder 04/02/2011  . Snoring 04/02/2011  . Tachycardia 04/02/2011    Goals Addressed   None    Called patient and discussed medication adherence  with patient, No issues at this time with current medication.   Patient denies ED visit since her last CPP follow up.  Patient denies any side effects with her medication. Patient denies any problems with her current pharmacy  Patient states everything is going good.   Reviewed chart and adherence measures. Per insurance data patient is 90-99 % adherent to atorvastatin and 100% adherent to metformin .   Follow-Up:  Pharmacist Review   Bessie [place] Pharmacist Assistant [phone] (40 Minutes Spent)

## 2020-06-16 DIAGNOSIS — I209 Angina pectoris, unspecified: Secondary | ICD-10-CM | POA: Diagnosis not present

## 2020-06-16 DIAGNOSIS — I10 Essential (primary) hypertension: Secondary | ICD-10-CM | POA: Diagnosis not present

## 2020-06-16 DIAGNOSIS — R0609 Other forms of dyspnea: Secondary | ICD-10-CM | POA: Diagnosis not present

## 2020-06-19 DIAGNOSIS — F4310 Post-traumatic stress disorder, unspecified: Secondary | ICD-10-CM | POA: Diagnosis not present

## 2020-06-23 DIAGNOSIS — F4310 Post-traumatic stress disorder, unspecified: Secondary | ICD-10-CM | POA: Diagnosis not present

## 2020-06-26 DIAGNOSIS — F4310 Post-traumatic stress disorder, unspecified: Secondary | ICD-10-CM | POA: Diagnosis not present

## 2020-06-30 DIAGNOSIS — F4310 Post-traumatic stress disorder, unspecified: Secondary | ICD-10-CM | POA: Diagnosis not present

## 2020-07-03 DIAGNOSIS — F4310 Post-traumatic stress disorder, unspecified: Secondary | ICD-10-CM | POA: Diagnosis not present

## 2020-07-10 ENCOUNTER — Telehealth (INDEPENDENT_AMBULATORY_CARE_PROVIDER_SITE_OTHER): Payer: Medicare PPO | Admitting: Psychiatry

## 2020-07-10 ENCOUNTER — Encounter (HOSPITAL_COMMUNITY): Payer: Self-pay | Admitting: Psychiatry

## 2020-07-10 ENCOUNTER — Other Ambulatory Visit: Payer: Self-pay

## 2020-07-10 DIAGNOSIS — F5105 Insomnia due to other mental disorder: Secondary | ICD-10-CM | POA: Diagnosis not present

## 2020-07-10 DIAGNOSIS — F424 Excoriation (skin-picking) disorder: Secondary | ICD-10-CM

## 2020-07-10 DIAGNOSIS — F4310 Post-traumatic stress disorder, unspecified: Secondary | ICD-10-CM

## 2020-07-10 DIAGNOSIS — Z79899 Other long term (current) drug therapy: Secondary | ICD-10-CM

## 2020-07-10 DIAGNOSIS — F411 Generalized anxiety disorder: Secondary | ICD-10-CM

## 2020-07-10 DIAGNOSIS — F3181 Bipolar II disorder: Secondary | ICD-10-CM

## 2020-07-10 MED ORDER — CLONAZEPAM 1 MG PO TBDP: 1.0000 mg | ORAL_TABLET | Freq: Three times a day (TID) | ORAL | 3 refills | Status: DC | PRN

## 2020-07-10 MED ORDER — ESCITALOPRAM OXALATE 10 MG PO TABS: 30.0000 mg | ORAL_TABLET | Freq: Every day | ORAL | 0 refills | Status: DC

## 2020-07-10 MED ORDER — MIRTAZAPINE 30 MG PO TABS
60.0000 mg | ORAL_TABLET | Freq: Every day | ORAL | 0 refills | Status: DC
Start: 2020-07-10 — End: 2020-09-18

## 2020-07-10 MED ORDER — LATUDA 20 MG PO TABS: 20.0000 mg | ORAL_TABLET | Freq: Every evening | ORAL | 0 refills | Status: DC

## 2020-07-10 MED ORDER — LITHIUM CARBONATE 600 MG PO CAPS: 600.0000 mg | ORAL_CAPSULE | Freq: Two times a day (BID) | ORAL | 0 refills | Status: DC

## 2020-07-10 MED ORDER — TRAZODONE HCL 100 MG PO TABS
ORAL_TABLET | ORAL | 0 refills | Status: DC
Start: 2020-07-10 — End: 2020-08-07

## 2020-07-10 NOTE — Progress Notes (Signed)
Virtual Visit via Telephone Note  I connected with Matthew Ortiz. on 07/10/20 at  2:00 PM EST by telephone and verified that I am speaking with the correct person using two identifiers.  Location: Patient: home Provider: office   I discussed the limitations, risks, security and privacy concerns of performing an evaluation and management service by telephone and the availability of in person appointments. I also discussed with the patient that there may be a patient responsible charge related to this service. The patient expressed understanding and agreed to proceed.   History of Present Illness: Overall Matthew Ortiz is doing well. He was recently diagnosed with peripheral artery disease. He has been experiencing severe pain in his legs at bedtime. Matthew Ortiz is getting a work up with his cardiologist. He is walking 2-3 miles several days of week. Matthew Ortiz is being careful about COVID and taking the appropriate precautions. Matthew Ortiz remains weary of his new neighbors. They have no direct conflict but Matthew Ortiz remains HV around them. Matthew Ortiz does not leave his house much. He is dealing with his wife's health issues and his dog's health problems. Matthew Ortiz is depressed. He often isolates and spends time watching tv. He is denying anhedonia. Matthew Ortiz feels trapped and hopeless at times. He sometimes has passive thoughts of death but denies any active SI/HI. Matthew Ortiz denies any manic or hypomanic like episodes. He sometimes feels that he has more energy and less desire to sleep but his PM meds put him to sleep for 5 hrs. He sometimes gets angry and will then shadow box. He denies HI. He is not engaging in skin picking.    Observations/Objective:  General Appearance: unable to assess  Eye Contact:  unable to assess  Speech:  Clear and Coherent and Normal Rate  Volume:  Normal  Mood:  Anxious and Depressed  Affect:  Congruent  Thought Process:  Coherent and Descriptions of Associations: Circumstantial  Orientation:  Full  (Time, Place, and Person)  Thought Content:  Logical  Suicidal Thoughts:  No  Homicidal Thoughts:  No  Memory:  Immediate;   Good  Judgement:  Good  Insight:  Good  Psychomotor Activity: unable to assess  Concentration:  Concentration: Good  Recall:  Good  Fund of Knowledge:  Good  Language:  Good  Akathisia:  unable to assess  Handed:  Right  AIMS (if indicated):     Assets:  Communication Skills Desire for Improvement Financial Resources/Insurance Housing Resilience Talents/Skills Transportation Vocational/Educational  ADL's:  unable to assess  Cognition:  WNL  Sleep:         Assessment and Plan: 1. PTSD (post-traumatic stress disorder) - clonazePAM (KLONOPIN) 1 MG disintegrating tablet; Take 1 tablet (1 mg total) by mouth 3 (three) times daily as needed.  Dispense: 60 tablet; Refill: 3 - escitalopram (LEXAPRO) 10 MG tablet; Take 3 tablets (30 mg total) by mouth daily.  Dispense: 270 tablet; Refill: 0 - mirtazapine (REMERON) 30 MG tablet; Take 2 tablets (60 mg total) by mouth at bedtime.  Dispense: 180 tablet; Refill: 0  2. GAD (generalized anxiety disorder) - clonazePAM (KLONOPIN) 1 MG disintegrating tablet; Take 1 tablet (1 mg total) by mouth 3 (three) times daily as needed.  Dispense: 60 tablet; Refill: 3 - escitalopram (LEXAPRO) 10 MG tablet; Take 3 tablets (30 mg total) by mouth daily.  Dispense: 270 tablet; Refill: 0 - mirtazapine (REMERON) 30 MG tablet; Take 2 tablets (60 mg total) by mouth at bedtime.  Dispense: 180 tablet; Refill: 0  3. Skin-picking disorder -  escitalopram (LEXAPRO) 10 MG tablet; Take 3 tablets (30 mg total) by mouth daily.  Dispense: 270 tablet; Refill: 0  4. Bipolar II disorder (HCC) - lithium 600 MG capsule; Take 1 capsule (600 mg total) by mouth 2 (two) times daily with a meal.  Dispense: 180 capsule; Refill: 0 - lurasidone (LATUDA) 20 MG TABS tablet; Take 1 tablet (20 mg total) by mouth every evening.  Dispense: 90 tablet; Refill: 0  5.  Insomnia related to another mental disorder - mirtazapine (REMERON) 30 MG tablet; Take 2 tablets (60 mg total) by mouth at bedtime.  Dispense: 180 tablet; Refill: 0 - traZODone (DESYREL) 100 MG tablet; TAKE ONE &ONE HALF TABLET BY MOUTH AT BEDTIME. MAY ALSO TAKE 1 AT BEDTIME AS NEEDED FOR INSOMNIA  Dispense: 225 tablet; Refill: 0  6. Encounter for long-term (current) use of medications - Comprehensive metabolic panel - Lithium level - TSH - Lipid panel - Prolactin - Hemoglobin A1c - CBC  Depression screen [facility] 2/9 07/10/2020 11/15/2019 02/28/2019 01/19/2018 01/09/2018  Decreased Interest 1 2 0 0 0  Down, Depressed, Hopeless 3 2 0 0 0  PHQ - 2 Score 4 4 0 0 0  Altered sleeping 2 3 - - -  Tired, decreased energy 2 1 - - -  Change in appetite 2 - - - -  Feeling bad or failure about yourself  2 - - - -  Trouble concentrating 2 2 - - -  Moving slowly or fidgety/restless 2 1 - - -  Suicidal thoughts 2 0 - - -  PHQ-9 Score 18 11 - - -  Difficult doing work/chores Somewhat difficult Somewhat difficult - - -   Flowsheet Row Video Visit from 07/10/2020 in [facility]  C-SSRS RISK CATEGORY Error: Q3, 4, or 5 should not be populated when Q2 is No        Follow Up Instructions: In 2-3 months or sooner if needed  I discussed the assessment and treatment plan with the patient. The patient was provided an opportunity to ask questions and all were answered. The patient agreed with the plan and demonstrated an understanding of the instructions.   The patient was advised to call back or seek an in-person evaluation if the symptoms worsen or if the condition fails to improve as anticipated.  I provided 30 minutes of non-face-to-face time during this encounter.   Charlcie Cradle, MD

## 2020-07-14 DIAGNOSIS — F4310 Post-traumatic stress disorder, unspecified: Secondary | ICD-10-CM | POA: Diagnosis not present

## 2020-07-15 DIAGNOSIS — I10 Essential (primary) hypertension: Secondary | ICD-10-CM | POA: Diagnosis not present

## 2020-07-17 DIAGNOSIS — F4310 Post-traumatic stress disorder, unspecified: Secondary | ICD-10-CM | POA: Diagnosis not present

## 2020-07-24 DIAGNOSIS — F4310 Post-traumatic stress disorder, unspecified: Secondary | ICD-10-CM | POA: Diagnosis not present

## 2020-07-24 DIAGNOSIS — I10 Essential (primary) hypertension: Secondary | ICD-10-CM | POA: Diagnosis not present

## 2020-07-24 DIAGNOSIS — E785 Hyperlipidemia, unspecified: Secondary | ICD-10-CM | POA: Diagnosis not present

## 2020-07-25 DIAGNOSIS — I872 Venous insufficiency (chronic) (peripheral): Secondary | ICD-10-CM | POA: Diagnosis not present

## 2020-07-28 DIAGNOSIS — F4310 Post-traumatic stress disorder, unspecified: Secondary | ICD-10-CM | POA: Diagnosis not present

## 2020-07-31 DIAGNOSIS — F4310 Post-traumatic stress disorder, unspecified: Secondary | ICD-10-CM | POA: Diagnosis not present

## 2020-07-31 DIAGNOSIS — Z79899 Other long term (current) drug therapy: Secondary | ICD-10-CM | POA: Diagnosis not present

## 2020-08-01 LAB — LITHIUM LEVEL: Lithium Lvl: 0.7 mmol/L (ref 0.5–1.2)

## 2020-08-01 LAB — COMPREHENSIVE METABOLIC PANEL
ALT: 24 IU/L (ref 0–44)
AST: 14 IU/L (ref 0–40)
Albumin/Globulin Ratio: 2 (ref 1.2–2.2)
Albumin: 4.6 g/dL (ref 3.8–4.8)
Alkaline Phosphatase: 92 IU/L (ref 44–121)
BUN/Creatinine Ratio: 20 (ref 10–24)
BUN: 19 mg/dL (ref 8–27)
Bilirubin Total: 2 mg/dL — ABNORMAL HIGH (ref 0.0–1.2)
CO2: 19 mmol/L — ABNORMAL LOW (ref 20–29)
Calcium: 9.3 mg/dL (ref 8.6–10.2)
Chloride: 106 mmol/L (ref 96–106)
Creatinine, Ser: 0.93 mg/dL (ref 0.76–1.27)
Globulin, Total: 2.3 g/dL (ref 1.5–4.5)
Glucose: 109 mg/dL — ABNORMAL HIGH (ref 65–99)
Potassium: 4.5 mmol/L (ref 3.5–5.2)
Sodium: 142 mmol/L (ref 134–144)
Total Protein: 6.9 g/dL (ref 6.0–8.5)
eGFR: 93 mL/min/{1.73_m2} (ref >59)

## 2020-08-01 LAB — LIPID PANEL
Chol/HDL Ratio: 2.8 ratio (ref 0.0–5.0)
Cholesterol, Total: 104 mg/dL (ref 100–199)
HDL: 37 mg/dL — ABNORMAL LOW (ref >39)
LDL Chol Calc (NIH): 42 mg/dL (ref 0–99)
Triglycerides: 143 mg/dL (ref 0–149)
VLDL Cholesterol Cal: 25 mg/dL (ref 5–40)

## 2020-08-01 LAB — TSH: TSH: 2.17 u[IU]/mL (ref 0.450–4.500)

## 2020-08-01 LAB — PROLACTIN: Prolactin: 6.1 ng/mL (ref 4.0–15.2)

## 2020-08-04 DIAGNOSIS — F4310 Post-traumatic stress disorder, unspecified: Secondary | ICD-10-CM | POA: Diagnosis not present

## 2020-08-05 ENCOUNTER — Other Ambulatory Visit (HOSPITAL_COMMUNITY): Payer: Self-pay | Admitting: Psychiatry

## 2020-08-05 DIAGNOSIS — F5105 Insomnia due to other mental disorder: Secondary | ICD-10-CM

## 2020-08-07 DIAGNOSIS — F4310 Post-traumatic stress disorder, unspecified: Secondary | ICD-10-CM | POA: Diagnosis not present

## 2020-08-11 DIAGNOSIS — F4310 Post-traumatic stress disorder, unspecified: Secondary | ICD-10-CM | POA: Diagnosis not present

## 2020-08-14 DIAGNOSIS — F4310 Post-traumatic stress disorder, unspecified: Secondary | ICD-10-CM | POA: Diagnosis not present

## 2020-08-18 DIAGNOSIS — F4310 Post-traumatic stress disorder, unspecified: Secondary | ICD-10-CM | POA: Diagnosis not present

## 2020-08-21 DIAGNOSIS — F4310 Post-traumatic stress disorder, unspecified: Secondary | ICD-10-CM | POA: Diagnosis not present

## 2020-08-25 DIAGNOSIS — F4310 Post-traumatic stress disorder, unspecified: Secondary | ICD-10-CM | POA: Diagnosis not present

## 2020-08-27 ENCOUNTER — Telehealth: Payer: Self-pay

## 2020-08-27 NOTE — Progress Notes (Signed)
    Chronic Care Management Pharmacy Assistant   Name: Matthew Ortiz.  MRN: [id number] DOB: 02-22-1961  Reason for Encounter: Medication Review/General Adherence Call.    Recent office visits:  No recent Office Visit  Recent consult visits:  07/10/2020 Dr.[name] MD ([facility] visits:  None in previous 6 months  Medications: Outpatient Encounter Medications as of 08/27/2020  Medication Sig  . Acetylcysteine (N-ACETYL-L-CYSTEINE) POWD 1 Units/day by Does not apply route daily.  Marland Kitchen albuterol (PROAIR HFA) 108 (90 Base) MCG/ACT inhaler Inhale 1-2 puffs into the lungs every 6 (six) hours as needed for wheezing or shortness of breath.  Marland Kitchen atorvastatin (LIPITOR) 10 MG tablet TAKE 1 TABLET BY MOUTH EVERY DAY  . clonazePAM (KLONOPIN) 1 MG disintegrating tablet Take 1 tablet (1 mg total) by mouth 3 (three) times daily as needed.  Marland Kitchen escitalopram (LEXAPRO) 10 MG tablet Take 3 tablets (30 mg total) by mouth daily.  . Fish Oil-Cholecalciferol (FISH OIL + D3 PO) Take by mouth. (Patient not taking: Reported on 11/15/2019)  . fluticasone (FLONASE) 50 MCG/ACT nasal spray Place 2 sprays into both nostrils daily.  . fluticasone (FLOVENT HFA) 110 MCG/ACT inhaler Inhale 2 puffs into the lungs 2 (two) times daily. (Patient taking differently: Inhale 1 puff into the lungs daily. )  . glucose blood test strip Use to test blood sugars 2 times daily.  . indomethacin (INDOCIN) 25 MG capsule Take 1 capsule (25 mg total) by mouth 3 (three) times daily as needed.  . lithium 600 MG capsule Take 1 capsule (600 mg total) by mouth 2 (two) times daily with a meal.  . lurasidone (LATUDA) 20 MG TABS tablet Take 1 tablet (20 mg total) by mouth every evening.  . meloxicam (MOBIC) 15 MG tablet TAKE 1 TABLET BY MOUTH EVERY DAY  . metFORMIN (GLUCOPHAGE) 500 MG tablet Take 1 tablet (500 mg total) by mouth 2 (two) times daily with a meal.  . mirtazapine (REMERON) 30 MG tablet Take 2 tablets (60 mg total) by  mouth at bedtime.  Marland Kitchen omeprazole (PRILOSEC) 40 MG capsule Take 40 mg by mouth daily.  . traZODone (DESYREL) 100 MG tablet TAKE ONE &ONE HALF TABLET BY MOUTH AT BEDTIME. MAY ALSO TAKE 1 AT BEDTIME AS NEEDED FOR INSOMNIA  . TRUEplus Lancets 30G MISC Use to test blood sugars 2 times daily.   No facility-administered encounter medications on file as of 08/27/2020.   Star Rating Drugs: Atorvastatin 10 mg last filled on 03/12/2020 for 90 day supply at [facility]. Metformin 500 mg last filled on 11/15/2019 for 90 day supply at [facility].  Patient states he switch his PCP to different  location in [place].Notifed Clinical Pharmacist.  [name] Pharmacist Assistant [phone]

## 2020-08-28 DIAGNOSIS — F4310 Post-traumatic stress disorder, unspecified: Secondary | ICD-10-CM | POA: Diagnosis not present

## 2020-09-01 DIAGNOSIS — F4310 Post-traumatic stress disorder, unspecified: Secondary | ICD-10-CM | POA: Diagnosis not present

## 2020-09-04 DIAGNOSIS — F4310 Post-traumatic stress disorder, unspecified: Secondary | ICD-10-CM | POA: Diagnosis not present

## 2020-09-08 DIAGNOSIS — F4310 Post-traumatic stress disorder, unspecified: Secondary | ICD-10-CM | POA: Diagnosis not present

## 2020-09-09 DIAGNOSIS — M25562 Pain in left knee: Secondary | ICD-10-CM | POA: Diagnosis not present

## 2020-09-09 DIAGNOSIS — M545 Low back pain, unspecified: Secondary | ICD-10-CM | POA: Diagnosis not present

## 2020-09-09 DIAGNOSIS — M25561 Pain in right knee: Secondary | ICD-10-CM | POA: Diagnosis not present

## 2020-09-11 DIAGNOSIS — F4310 Post-traumatic stress disorder, unspecified: Secondary | ICD-10-CM | POA: Diagnosis not present

## 2020-09-18 ENCOUNTER — Telehealth (HOSPITAL_COMMUNITY): Payer: Medicare PPO | Admitting: Psychiatry

## 2020-09-18 ENCOUNTER — Other Ambulatory Visit (HOSPITAL_COMMUNITY): Payer: Self-pay | Admitting: Psychiatry

## 2020-09-18 ENCOUNTER — Other Ambulatory Visit: Payer: Self-pay

## 2020-09-18 DIAGNOSIS — F3181 Bipolar II disorder: Secondary | ICD-10-CM | POA: Diagnosis not present

## 2020-09-18 DIAGNOSIS — F424 Excoriation (skin-picking) disorder: Secondary | ICD-10-CM

## 2020-09-18 DIAGNOSIS — F411 Generalized anxiety disorder: Secondary | ICD-10-CM | POA: Diagnosis not present

## 2020-09-18 DIAGNOSIS — F4310 Post-traumatic stress disorder, unspecified: Secondary | ICD-10-CM

## 2020-09-18 DIAGNOSIS — F5105 Insomnia due to other mental disorder: Secondary | ICD-10-CM | POA: Diagnosis not present

## 2020-09-18 MED ORDER — ESCITALOPRAM OXALATE 10 MG PO TABS: 30.0000 mg | ORAL_TABLET | Freq: Every day | ORAL | 0 refills | Status: DC

## 2020-09-18 MED ORDER — TRAZODONE HCL 100 MG PO TABS: ORAL_TABLET | ORAL | 0 refills | Status: DC

## 2020-09-18 MED ORDER — CLONAZEPAM 1 MG PO TBDP: 1.0000 mg | ORAL_TABLET | Freq: Three times a day (TID) | ORAL | 3 refills | Status: DC | PRN

## 2020-09-18 MED ORDER — LATUDA 20 MG PO TABS: 20.0000 mg | ORAL_TABLET | Freq: Every evening | ORAL | 0 refills | Status: DC

## 2020-09-18 MED ORDER — LITHIUM CARBONATE 600 MG PO CAPS: 600.0000 mg | ORAL_CAPSULE | Freq: Two times a day (BID) | ORAL | 0 refills | Status: DC

## 2020-09-18 MED ORDER — MIRTAZAPINE 30 MG PO TABS: 60.0000 mg | ORAL_TABLET | Freq: Every day | ORAL | 0 refills | Status: DC

## 2020-09-18 NOTE — Telephone Encounter (Signed)
Needs PA for Lexapro. He has been on this medication and dose for years. He has failed other antidepressants. Risk of decompensation if medication is altered.

## 2020-09-18 NOTE — Progress Notes (Signed)
Virtual Visit via Telephone Note  I connected with Matthew Ortiz. on 09/18/20 at  1:30 PM EDT by telephone and verified that I am speaking with the correct person using two identifiers.  Location: Patient: home Provider: office   I discussed the limitations, risks, security and privacy concerns of performing an evaluation and management service by telephone and the availability of in person appointments. I also discussed with the patient that there may be a patient responsible charge related to this service. The patient expressed understanding and agreed to proceed.   History of Present Illness: Matthew Ortiz and his wife have both had some health stressors. She seems depressed. She is going to have to knee replacement surgery this summer.  It has lead to a lot of arguments. She often accuses him of not doing enough to take care of her. The arguments have crossed a line. He has tried to talk to her about it and she told him he could leave if he was not happy. Matthew Ortiz admits he is depressed. He talks about it twice a week with his therapist. He was offered the option of having a Education officer, museum come in to do an assessment for a home health aide. His depression is a little worse due to home stressors. He is isolating a little more due to gas prices and busy schedules. There are times that he is drained and doesn't want to socialize. He has significant knee pain and went to [facility]. They gave him some meds to help the pain so he is sleeping better. He has some nightmares and some bizarre dreams. His appetite has increased due to stress. He notes an increase in thoughts about wishing he was dead. He feels trapped with no hope of escape. He is having SI without plan or intent. It happens about 2-3x/week without plan or intent. It typically happens when they are arguing.    Observations/Objective:  General Appearance: unable to assess  Eye Contact:  unable to assess  Speech:  Clear and Coherent and Normal  Rate  Volume:  Normal  Mood:  Anxious and Depressed  Affect:  Congruent  Thought Process:  Goal Directed, Linear and Descriptions of Associations: Intact  Orientation:  Full (Time, Place, and Person)  Thought Content:  Paranoid Ideation and Rumination  Suicidal Thoughts:  Yes.  without intent/plan  Homicidal Thoughts:  No  Memory:  Immediate;   Good  Judgement:  Good  Insight:  Good  Psychomotor Activity: unable to assess  Concentration:  Concentration: Good  Recall:  Good  Fund of Knowledge:  Good  Language:  Good  Akathisia:  unable to assess  Handed:  Right  AIMS (if indicated):     Assets:  Communication Skills Desire for Improvement Financial Resources/Insurance Housing Resilience Talents/Skills Transportation Vocational/Educational  ADL's:  unable to assess  Cognition:  WNL  Sleep:         Assessment and Plan: Depression screen [facility] 2/9 09/18/2020 07/10/2020 11/15/2019 02/28/2019 01/19/2018  Decreased Interest 2 1 2  0 0  Down, Depressed, Hopeless 3 3 2  0 0  PHQ - 2 Score 5 4 4  0 0  Altered sleeping 2 2 3  - -  Tired, decreased energy 3 2 1  - -  Change in appetite 3 2 - - -  Feeling bad or failure about yourself  3 2 - - -  Trouble concentrating 2 2 2  - -  Moving slowly or fidgety/restless 0 2 1 - -  Suicidal thoughts 2 2 0 - -  PHQ-9 Score 20 18 11  - -  Difficult doing work/chores Very difficult Somewhat difficult Somewhat difficult - -    Flowsheet Row Video Visit from 09/18/2020 in [facility] Video Visit from 07/10/2020 in [place] Error: Q7 should not be populated when Q6 is No Error: Q3, 4, or 5 should not be populated when Q2 is No     - talked about the importance of self care  - he is active in therapy  - he does not want his meds changed today and is working on using his coping skills and using therapy  1. PTSD (post-traumatic stress disorder) -  clonazePAM (KLONOPIN) 1 MG disintegrating tablet; Take 1 tablet (1 mg total) by mouth 3 (three) times daily as needed.  Dispense: 60 tablet; Refill: 3 - escitalopram (LEXAPRO) 10 MG tablet; Take 3 tablets (30 mg total) by mouth daily.  Dispense: 270 tablet; Refill: 0 - mirtazapine (REMERON) 30 MG tablet; Take 2 tablets (60 mg total) by mouth at bedtime.  Dispense: 180 tablet; Refill: 0  2. GAD (generalized anxiety disorder) - clonazePAM (KLONOPIN) 1 MG disintegrating tablet; Take 1 tablet (1 mg total) by mouth 3 (three) times daily as needed.  Dispense: 60 tablet; Refill: 3 - escitalopram (LEXAPRO) 10 MG tablet; Take 3 tablets (30 mg total) by mouth daily.  Dispense: 270 tablet; Refill: 0 - mirtazapine (REMERON) 30 MG tablet; Take 2 tablets (60 mg total) by mouth at bedtime.  Dispense: 180 tablet; Refill: 0  3. Skin-picking disorder - escitalopram (LEXAPRO) 10 MG tablet; Take 3 tablets (30 mg total) by mouth daily.  Dispense: 270 tablet; Refill: 0  4. Bipolar II disorder (HCC) - lithium 600 MG capsule; Take 1 capsule (600 mg total) by mouth 2 (two) times daily with a meal.  Dispense: 180 capsule; Refill: 0 - lurasidone (LATUDA) 20 MG TABS tablet; Take 1 tablet (20 mg total) by mouth every evening.  Dispense: 90 tablet; Refill: 0  5. Insomnia related to another mental disorder - mirtazapine (REMERON) 30 MG tablet; Take 2 tablets (60 mg total) by mouth at bedtime.  Dispense: 180 tablet; Refill: 0 - traZODone (DESYREL) 100 MG tablet; TAKE ONE &ONE HALF TABLET BY MOUTH AT BEDTIME. MAY ALSO TAKE 1 AT BEDTIME AS NEEDED FOR INSOMNIA  Dispense: 225 tablet; Refill: 0     Follow Up Instructions: In 4-8 weeks or sooner if needed   I discussed the assessment and treatment plan with the patient. The patient was provided an opportunity to ask questions and all were answered. The patient agreed with the plan and demonstrated an understanding of the instructions.   The patient was advised to call back or  seek an in-person evaluation if the symptoms worsen or if the condition fails to improve as anticipated.  I provided 25 minutes of non-face-to-face time during this encounter.   Charlcie Cradle, MD

## 2020-09-22 DIAGNOSIS — F4310 Post-traumatic stress disorder, unspecified: Secondary | ICD-10-CM | POA: Diagnosis not present

## 2020-09-24 ENCOUNTER — Telehealth (HOSPITAL_COMMUNITY): Payer: Self-pay

## 2020-09-24 DIAGNOSIS — E119 Type 2 diabetes mellitus without complications: Secondary | ICD-10-CM | POA: Diagnosis not present

## 2020-09-24 DIAGNOSIS — F319 Bipolar disorder, unspecified: Secondary | ICD-10-CM | POA: Diagnosis not present

## 2020-09-24 DIAGNOSIS — E7800 Pure hypercholesterolemia, unspecified: Secondary | ICD-10-CM | POA: Diagnosis not present

## 2020-09-24 DIAGNOSIS — K580 Irritable bowel syndrome with diarrhea: Secondary | ICD-10-CM | POA: Diagnosis not present

## 2020-09-24 DIAGNOSIS — N401 Enlarged prostate with lower urinary tract symptoms: Secondary | ICD-10-CM | POA: Diagnosis not present

## 2020-09-24 DIAGNOSIS — I10 Essential (primary) hypertension: Secondary | ICD-10-CM | POA: Diagnosis not present

## 2020-09-24 DIAGNOSIS — K219 Gastro-esophageal reflux disease without esophagitis: Secondary | ICD-10-CM | POA: Diagnosis not present

## 2020-09-24 DIAGNOSIS — J45909 Unspecified asthma, uncomplicated: Secondary | ICD-10-CM | POA: Diagnosis not present

## 2020-09-24 DIAGNOSIS — Z1159 Encounter for screening for other viral diseases: Secondary | ICD-10-CM | POA: Diagnosis not present

## 2020-09-24 NOTE — Telephone Encounter (Signed)
Spoke with [facility] and started a PA on patient's Escitalopram 10mg  tablet.  Sent for Review & waiting for determination  (Reference # [id number] s/w [place])

## 2020-09-25 ENCOUNTER — Telehealth: Payer: Self-pay | Admitting: Family Medicine

## 2020-09-25 ENCOUNTER — Telehealth (HOSPITAL_COMMUNITY): Payer: Self-pay

## 2020-09-25 DIAGNOSIS — R1310 Dysphagia, unspecified: Secondary | ICD-10-CM | POA: Diagnosis not present

## 2020-09-25 DIAGNOSIS — K449 Diaphragmatic hernia without obstruction or gangrene: Secondary | ICD-10-CM | POA: Diagnosis not present

## 2020-09-25 DIAGNOSIS — R197 Diarrhea, unspecified: Secondary | ICD-10-CM | POA: Diagnosis not present

## 2020-09-25 DIAGNOSIS — Z800 Family history of malignant neoplasm of digestive organs: Secondary | ICD-10-CM | POA: Diagnosis not present

## 2020-09-25 DIAGNOSIS — Z1211 Encounter for screening for malignant neoplasm of colon: Secondary | ICD-10-CM | POA: Diagnosis not present

## 2020-09-25 DIAGNOSIS — F4310 Post-traumatic stress disorder, unspecified: Secondary | ICD-10-CM | POA: Diagnosis not present

## 2020-09-25 DIAGNOSIS — K219 Gastro-esophageal reflux disease without esophagitis: Secondary | ICD-10-CM | POA: Diagnosis not present

## 2020-09-25 NOTE — Telephone Encounter (Signed)
[employer] PRES[employer] COVERAGE APPROVED  ESCITALOPRAM 10MG  TABLET REFERENCE # [id number] EFFECTGIVE 09/24/2020 TO 05/02/2021  [facility]/W [facility] NOTIFIED PHARMACY. PATIENT WILL BE NOTIFED WHEN MEDICATION IS READY FOR PICKUP

## 2020-09-25 NOTE — Telephone Encounter (Signed)
I called patient to schedule AWV.  Patient said he has transferred to a practice in [place].  Please remove PCP.

## 2020-09-25 NOTE — Telephone Encounter (Signed)
[employer] COVER[employer] APPROVED  ESCITALOPRAM 10MG  TABLET REFERENCE # [id number] EFFECTIVE 09/24/2020 TO 05/02/2021  [facility] PHARMACY NOTIFIED. PT WILL BE NOTIFIED WHEN MEDICATION IS READY FOR PICKUP

## 2020-09-29 DIAGNOSIS — F4310 Post-traumatic stress disorder, unspecified: Secondary | ICD-10-CM | POA: Diagnosis not present

## 2020-10-02 ENCOUNTER — Other Ambulatory Visit (HOSPITAL_COMMUNITY): Payer: Self-pay | Admitting: Psychiatry

## 2020-10-02 DIAGNOSIS — F424 Excoriation (skin-picking) disorder: Secondary | ICD-10-CM

## 2020-10-02 DIAGNOSIS — F411 Generalized anxiety disorder: Secondary | ICD-10-CM

## 2020-10-02 DIAGNOSIS — F4310 Post-traumatic stress disorder, unspecified: Secondary | ICD-10-CM

## 2020-10-02 MED ORDER — ESCITALOPRAM OXALATE 10 MG PO TABS: 3.0000 | ORAL_TABLET | Freq: Every day | ORAL | 0 refills | Status: DC

## 2020-10-09 DIAGNOSIS — F4310 Post-traumatic stress disorder, unspecified: Secondary | ICD-10-CM | POA: Diagnosis not present

## 2020-10-13 DIAGNOSIS — F4310 Post-traumatic stress disorder, unspecified: Secondary | ICD-10-CM | POA: Diagnosis not present

## 2020-10-16 DIAGNOSIS — F4310 Post-traumatic stress disorder, unspecified: Secondary | ICD-10-CM | POA: Diagnosis not present

## 2020-10-19 ENCOUNTER — Other Ambulatory Visit: Payer: Self-pay | Admitting: Family Medicine

## 2020-10-20 DIAGNOSIS — F4310 Post-traumatic stress disorder, unspecified: Secondary | ICD-10-CM | POA: Diagnosis not present

## 2020-10-20 NOTE — Telephone Encounter (Signed)
Last OV 11/15/19 Last fill 04/09/20  #90/1

## 2020-10-23 DIAGNOSIS — F4310 Post-traumatic stress disorder, unspecified: Secondary | ICD-10-CM | POA: Diagnosis not present

## 2020-10-27 DIAGNOSIS — E119 Type 2 diabetes mellitus without complications: Secondary | ICD-10-CM | POA: Diagnosis not present

## 2020-10-27 DIAGNOSIS — E782 Mixed hyperlipidemia: Secondary | ICD-10-CM | POA: Diagnosis not present

## 2020-10-27 DIAGNOSIS — F4310 Post-traumatic stress disorder, unspecified: Secondary | ICD-10-CM | POA: Diagnosis not present

## 2020-10-27 DIAGNOSIS — K219 Gastro-esophageal reflux disease without esophagitis: Secondary | ICD-10-CM | POA: Diagnosis not present

## 2020-10-27 DIAGNOSIS — I10 Essential (primary) hypertension: Secondary | ICD-10-CM | POA: Diagnosis not present

## 2020-11-06 DIAGNOSIS — F4310 Post-traumatic stress disorder, unspecified: Secondary | ICD-10-CM | POA: Diagnosis not present

## 2020-11-10 DIAGNOSIS — F4310 Post-traumatic stress disorder, unspecified: Secondary | ICD-10-CM | POA: Diagnosis not present

## 2020-11-13 DIAGNOSIS — F4310 Post-traumatic stress disorder, unspecified: Secondary | ICD-10-CM | POA: Diagnosis not present

## 2020-11-17 DIAGNOSIS — F4310 Post-traumatic stress disorder, unspecified: Secondary | ICD-10-CM | POA: Diagnosis not present

## 2020-11-24 DIAGNOSIS — F4310 Post-traumatic stress disorder, unspecified: Secondary | ICD-10-CM | POA: Diagnosis not present

## 2020-11-27 ENCOUNTER — Telehealth (INDEPENDENT_AMBULATORY_CARE_PROVIDER_SITE_OTHER): Payer: Medicare PPO | Admitting: Psychiatry

## 2020-11-27 ENCOUNTER — Other Ambulatory Visit: Payer: Self-pay

## 2020-11-27 DIAGNOSIS — F3181 Bipolar II disorder: Secondary | ICD-10-CM

## 2020-11-27 DIAGNOSIS — F5105 Insomnia due to other mental disorder: Secondary | ICD-10-CM | POA: Diagnosis not present

## 2020-11-27 DIAGNOSIS — F4310 Post-traumatic stress disorder, unspecified: Secondary | ICD-10-CM

## 2020-11-27 DIAGNOSIS — F411 Generalized anxiety disorder: Secondary | ICD-10-CM | POA: Diagnosis not present

## 2020-11-27 DIAGNOSIS — F424 Excoriation (skin-picking) disorder: Secondary | ICD-10-CM | POA: Diagnosis not present

## 2020-11-27 MED ORDER — TRAZODONE HCL 100 MG PO TABS: ORAL_TABLET | ORAL | 0 refills | Status: DC

## 2020-11-27 MED ORDER — CLONAZEPAM 1 MG PO TBDP: 1.0000 mg | ORAL_TABLET | Freq: Three times a day (TID) | ORAL | 2 refills | Status: DC | PRN

## 2020-11-27 MED ORDER — MIRTAZAPINE 30 MG PO TABS: 60.0000 mg | ORAL_TABLET | Freq: Every day | ORAL | 0 refills | Status: DC

## 2020-11-27 MED ORDER — ESCITALOPRAM OXALATE 10 MG PO TABS: 30.0000 mg | ORAL_TABLET | Freq: Every day | ORAL | 0 refills | Status: DC

## 2020-11-27 MED ORDER — LITHIUM CARBONATE 600 MG PO CAPS: 600.0000 mg | ORAL_CAPSULE | Freq: Two times a day (BID) | ORAL | 0 refills | Status: DC

## 2020-11-27 NOTE — Progress Notes (Signed)
Virtual Visit via Telephone Note  I connected with [name] on 11/27/20 at  2:30 PM EDT by telephone and verified that I am speaking with the correct person using two identifiers.  Location: Patient: home Provider: office   I discussed the limitations, risks, security and privacy concerns of performing an evaluation and management service by telephone and the availability of in person appointments. I also discussed with the patient that there may be a patient responsible charge related to this service. The patient expressed understanding and agreed to proceed.   History of Present Illness: Matthew Ortiz is having a hard time. His wife had knee replacement surgery last week. He has been having a difficult time trying to help her. He has no help or support despite exploring numerous options. He does not feel he is qualified for the things that are expected of him to take care of her.  Matthew Ortiz is frustrated, depressed, anxious and is triggering his PTSD. He feels like he and his wife are under attack and that they were not properly informed of aftercare. He does have someone helping them take [name] to her follow up appointment next week. This is causing a lot of stress and friction between [place]. He denies HI. He does have ongoing passive SI without plan or intent when he is really frustrated.    Observations/Objective:  General Appearance: unable to assess  Eye Contact:  unable to assess  Speech:  Clear and Coherent and Normal Rate  Volume:  Normal  Mood:  Anxious, Depressed, and Irritable  Affect:  Congruent  Thought Process:  Coherent and Descriptions of Associations: Circumstantial  Orientation:  Full (Time, Place, and Person)  Thought Content:  Paranoid Ideation and Rumination  Suicidal Thoughts:  Yes.  without intent/plan  Homicidal Thoughts:  No  Memory:  Immediate;   Good  Judgement:  Good  Insight:  Good  Psychomotor Activity: unable to assess  Concentration:   Concentration: Good  Recall:  Good  Fund of Knowledge:  Good  Language:  Good  Akathisia:  unable to assess  Handed:  unable to assess  AIMS (if indicated):     Assets:  Communication Skills Desire for Improvement Financial Resources/Insurance Housing Intimacy Resilience Talents/Skills Transportation Vocational/Educational  ADL's:  unable to assess  Cognition:  WNL  Sleep:        Assessment and Plan:  Talked about the importance of self care  D/c Latuda due to cost  Matthew Ortiz continues to engage in therapy  1. PTSD (post-traumatic stress disorder) - clonazePAM (KLONOPIN) 1 MG disintegrating tablet; Take 1 tablet (1 mg total) by mouth 3 (three) times daily as needed.  Dispense: 60 tablet; Refill: 2 - escitalopram (LEXAPRO) 10 MG tablet; Take 3 tablets (30 mg total) by mouth daily.  Dispense: 270 tablet; Refill: 0 - mirtazapine (REMERON) 30 MG tablet; Take 2 tablets (60 mg total) by mouth at bedtime.  Dispense: 180 tablet; Refill: 0  2. GAD (generalized anxiety disorder) - clonazePAM (KLONOPIN) 1 MG disintegrating tablet; Take 1 tablet (1 mg total) by mouth 3 (three) times daily as needed.  Dispense: 60 tablet; Refill: 2 - escitalopram (LEXAPRO) 10 MG tablet; Take 3 tablets (30 mg total) by mouth daily.  Dispense: 270 tablet; Refill: 0 - mirtazapine (REMERON) 30 MG tablet; Take 2 tablets (60 mg total) by mouth at bedtime.  Dispense: 180 tablet; Refill: 0  3. Skin-picking disorder - escitalopram (LEXAPRO) 10 MG tablet; Take 3 tablets (30 mg total) by mouth daily.  Dispense: 270 tablet; Refill: 0  4. Bipolar II disorder (HCC) - lithium 600 MG capsule; Take 1 capsule (600 mg total) by mouth 2 (two) times daily with a meal.  Dispense: 180 capsule; Refill: 0  5. Insomnia related to another mental disorder - mirtazapine (REMERON) 30 MG tablet; Take 2 tablets (60 mg total) by mouth at bedtime.  Dispense: 180 tablet; Refill: 0 - traZODone (DESYREL) 100 MG tablet; TAKE ONE &ONE HALF  TABLET BY MOUTH AT BEDTIME. MAY ALSO TAKE 1 AT BEDTIME AS NEEDED FOR INSOMNIA  Dispense: 225 tablet; Refill: 0   Follow Up Instructions: In 2 months or sooner if needed   I discussed the assessment and treatment plan with the patient. The patient was provided an opportunity to ask questions and all were answered. The patient agreed with the plan and demonstrated an understanding of the instructions.   The patient was advised to call back or seek an in-person evaluation if the symptoms worsen or if the condition fails to improve as anticipated.  I provided 20 minutes of non-face-to-face time during this encounter.   Charlcie Cradle, MD

## 2020-12-01 DIAGNOSIS — F4310 Post-traumatic stress disorder, unspecified: Secondary | ICD-10-CM | POA: Diagnosis not present

## 2020-12-04 DIAGNOSIS — F4310 Post-traumatic stress disorder, unspecified: Secondary | ICD-10-CM | POA: Diagnosis not present

## 2020-12-11 DIAGNOSIS — F4310 Post-traumatic stress disorder, unspecified: Secondary | ICD-10-CM | POA: Diagnosis not present

## 2020-12-15 DIAGNOSIS — F4310 Post-traumatic stress disorder, unspecified: Secondary | ICD-10-CM | POA: Diagnosis not present

## 2020-12-18 DIAGNOSIS — F4310 Post-traumatic stress disorder, unspecified: Secondary | ICD-10-CM | POA: Diagnosis not present

## 2020-12-20 ENCOUNTER — Other Ambulatory Visit (HOSPITAL_COMMUNITY): Payer: Self-pay | Admitting: Psychiatry

## 2020-12-20 DIAGNOSIS — F424 Excoriation (skin-picking) disorder: Secondary | ICD-10-CM

## 2020-12-20 DIAGNOSIS — F411 Generalized anxiety disorder: Secondary | ICD-10-CM

## 2020-12-20 DIAGNOSIS — F4310 Post-traumatic stress disorder, unspecified: Secondary | ICD-10-CM

## 2020-12-25 DIAGNOSIS — F4310 Post-traumatic stress disorder, unspecified: Secondary | ICD-10-CM | POA: Diagnosis not present

## 2020-12-29 DIAGNOSIS — F4310 Post-traumatic stress disorder, unspecified: Secondary | ICD-10-CM | POA: Diagnosis not present

## 2021-01-01 DIAGNOSIS — F4310 Post-traumatic stress disorder, unspecified: Secondary | ICD-10-CM | POA: Diagnosis not present

## 2021-01-05 DIAGNOSIS — F4310 Post-traumatic stress disorder, unspecified: Secondary | ICD-10-CM | POA: Diagnosis not present

## 2021-01-07 DIAGNOSIS — F4310 Post-traumatic stress disorder, unspecified: Secondary | ICD-10-CM | POA: Diagnosis not present

## 2021-01-29 ENCOUNTER — Telehealth (HOSPITAL_BASED_OUTPATIENT_CLINIC_OR_DEPARTMENT_OTHER): Payer: Medicare PPO | Admitting: Psychiatry

## 2021-01-29 ENCOUNTER — Other Ambulatory Visit: Payer: Self-pay

## 2021-01-29 DIAGNOSIS — F424 Excoriation (skin-picking) disorder: Secondary | ICD-10-CM

## 2021-01-29 DIAGNOSIS — F3181 Bipolar II disorder: Secondary | ICD-10-CM

## 2021-01-29 DIAGNOSIS — F411 Generalized anxiety disorder: Secondary | ICD-10-CM

## 2021-01-29 DIAGNOSIS — F4310 Post-traumatic stress disorder, unspecified: Secondary | ICD-10-CM | POA: Diagnosis not present

## 2021-01-29 DIAGNOSIS — F5105 Insomnia due to other mental disorder: Secondary | ICD-10-CM

## 2021-01-29 DIAGNOSIS — Z79899 Other long term (current) drug therapy: Secondary | ICD-10-CM

## 2021-01-29 MED ORDER — TRAZODONE HCL 100 MG PO TABS: ORAL_TABLET | ORAL | 0 refills | Status: DC

## 2021-01-29 MED ORDER — CLONAZEPAM 1 MG PO TBDP: 1.0000 mg | ORAL_TABLET | Freq: Three times a day (TID) | ORAL | 2 refills | Status: DC | PRN

## 2021-01-29 MED ORDER — LITHIUM CARBONATE 600 MG PO CAPS
600.0000 mg | ORAL_CAPSULE | Freq: Two times a day (BID) | ORAL | 0 refills | Status: DC
Start: 2021-01-29 — End: 2021-04-09

## 2021-01-29 MED ORDER — ESCITALOPRAM OXALATE 10 MG PO TABS: 30.0000 mg | ORAL_TABLET | Freq: Every day | ORAL | 0 refills | Status: DC

## 2021-01-29 MED ORDER — MIRTAZAPINE 30 MG PO TABS: 60.0000 mg | ORAL_TABLET | Freq: Every day | ORAL | 0 refills | Status: DC

## 2021-01-29 NOTE — Progress Notes (Signed)
Virtual Visit via Telephone Note  I connected with [name] on 01/29/21 at  2:30 PM EDT by telephone and verified that I am speaking with the correct person using two identifiers.  Location: Patient: home Provider: office   I discussed the limitations, risks, security and privacy concerns of performing an evaluation and management service by telephone and the availability of in person appointments. I also discussed with the patient that there may be a patient responsible charge related to this service. The patient expressed understanding and agreed to proceed.   History of Present Illness: Matthew Ortiz shares that he is a little better this week as compared to last week. His wife is slowly recovering from knee replacement surgery. His wife's attitude has calmed a little over the last few days. Hopefully it will last. She is still going to physical therapy and she is able to move around better and can drive some. Matthew Ortiz is going to have 3 vein ablations in November. His leg pain is near constant. His sleep is poor due to get pain and hopes the ablation will help. Matthew Ortiz has been diagnosed with peripheral artery disease. He is feeling overwhelmed by his own health issues. Matthew Ortiz admits he is a little more depressed lately. He has low motivation and is endorsing some anhedonia. Matthew Ortiz is having ongoing issues with his brother and other family members. Since our last visit he has had weeks of having a song in his head and was feeling very agitated. Matthew Ortiz denies impulsivity. He had SI about 1 month ago and talked to his therapist and moved past it and it has resolved. He denies SI/HI. During high stress times he does skin pick but it stops when he is feeling a little better.  Matthew Ortiz has stopped N-Acetly- L- Cysteine but doesn't know why.    Observations/Objective:  General Appearance: unable to assess  Eye Contact:  unable to assess  Speech:  Clear and Coherent and Normal Rate  Volume:  Normal   Mood:  Anxious and Depressed  Affect:  Congruent  Thought Process:  Coherent and Descriptions of Associations: Circumstantial  Orientation:  Full (Time, Place, and Person)  Thought Content:  Rumination  Suicidal Thoughts:  No  Homicidal Thoughts:  No  Memory:  Immediate;   Good  Judgement:  Good  Insight:  Good  Psychomotor Activity: unable to assess  Concentration:  Concentration: Good  Recall:  Good  Fund of Knowledge:  Good  Language:  Good  Akathisia:  unable to assess  Handed:  unable to assess  AIMS (if indicated):     Assets:  Communication Skills Desire for Improvement Financial Resources/Insurance Housing Intimacy Resilience Social Support Talents/Skills Transportation Vocational/Educational  ADL's:  unable to assess  Cognition:  WNL  Sleep:        Assessment and Plan: 1. Bipolar II disorder (HCC) - lithium 600 MG capsule; Take 1 capsule (600 mg total) by mouth 2 (two) times daily with a meal.  Dispense: 180 capsule; Refill: 0  2. PTSD (post-traumatic stress disorder) - clonazePAM (KLONOPIN) 1 MG disintegrating tablet; Take 1 tablet (1 mg total) by mouth 3 (three) times daily as needed.  Dispense: 60 tablet; Refill: 2 - escitalopram (LEXAPRO) 10 MG tablet; Take 3 tablets (30 mg total) by mouth daily.  Dispense: 270 tablet; Refill: 0 - mirtazapine (REMERON) 30 MG tablet; Take 2 tablets (60 mg total) by mouth at bedtime.  Dispense: 180 tablet; Refill: 0  3. GAD (generalized anxiety disorder) - clonazePAM (KLONOPIN) 1  MG disintegrating tablet; Take 1 tablet (1 mg total) by mouth 3 (three) times daily as needed.  Dispense: 60 tablet; Refill: 2 - escitalopram (LEXAPRO) 10 MG tablet; Take 3 tablets (30 mg total) by mouth daily.  Dispense: 270 tablet; Refill: 0 - mirtazapine (REMERON) 30 MG tablet; Take 2 tablets (60 mg total) by mouth at bedtime.  Dispense: 180 tablet; Refill: 0  4. Skin-picking disorder - escitalopram (LEXAPRO) 10 MG tablet; Take 3 tablets (30  mg total) by mouth daily.  Dispense: 270 tablet; Refill: 0  5. Insomnia related to another mental disorder - mirtazapine (REMERON) 30 MG tablet; Take 2 tablets (60 mg total) by mouth at bedtime.  Dispense: 180 tablet; Refill: 0 - traZODone (DESYREL) 100 MG tablet; TAKE ONE &ONE HALF TABLET BY MOUTH AT BEDTIME. MAY ALSO TAKE 1 AT BEDTIME AS NEEDED FOR INSOMNIA  Dispense: 225 tablet; Refill: 0  6. Encounter for long-term (current) use of medications - Lithium level - CBC - Hemoglobin A0O - Basic Metabolic Panel (BMET)    Follow Up Instructions: In 2-3 months or sooner if needed   I discussed the assessment and treatment plan with the patient. The patient was provided an opportunity to ask questions and all were answered. The patient agreed with the plan and demonstrated an understanding of the instructions.   The patient was advised to call back or seek an in-person evaluation if the symptoms worsen or if the condition fails to improve as anticipated.  I provided 28 minutes of non-face-to-face time during this encounter.   Charlcie Cradle, MD
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
## 2021-04-09 ENCOUNTER — Other Ambulatory Visit: Payer: Self-pay

## 2021-04-09 ENCOUNTER — Telehealth (HOSPITAL_BASED_OUTPATIENT_CLINIC_OR_DEPARTMENT_OTHER): Payer: Medicare PPO | Admitting: Psychiatry

## 2021-04-09 ENCOUNTER — Encounter (HOSPITAL_COMMUNITY): Payer: Self-pay | Admitting: Psychiatry

## 2021-04-09 DIAGNOSIS — F4310 Post-traumatic stress disorder, unspecified: Secondary | ICD-10-CM | POA: Diagnosis not present

## 2021-04-09 DIAGNOSIS — F3181 Bipolar II disorder: Secondary | ICD-10-CM | POA: Diagnosis not present

## 2021-04-09 DIAGNOSIS — F424 Excoriation (skin-picking) disorder: Secondary | ICD-10-CM | POA: Diagnosis not present

## 2021-04-09 DIAGNOSIS — F5105 Insomnia due to other mental disorder: Secondary | ICD-10-CM

## 2021-04-09 DIAGNOSIS — F411 Generalized anxiety disorder: Secondary | ICD-10-CM

## 2021-04-09 MED ORDER — CLONAZEPAM 1 MG PO TBDP: 1.0000 mg | ORAL_TABLET | Freq: Three times a day (TID) | ORAL | 2 refills | Status: DC | PRN

## 2021-04-09 MED ORDER — TRAZODONE HCL 100 MG PO TABS: ORAL_TABLET | ORAL | 0 refills | Status: DC

## 2021-04-09 MED ORDER — LITHIUM CARBONATE 300 MG PO CAPS: ORAL_CAPSULE | ORAL | 0 refills | Status: DC

## 2021-04-09 MED ORDER — ESCITALOPRAM OXALATE 10 MG PO TABS: 30.0000 mg | ORAL_TABLET | Freq: Every day | ORAL | 0 refills | Status: DC

## 2021-04-09 MED ORDER — MIRTAZAPINE 30 MG PO TABS: 60.0000 mg | ORAL_TABLET | Freq: Every day | ORAL | 0 refills | Status: DC

## 2021-04-09 NOTE — Progress Notes (Signed)
Virtual Visit via Video Note  I connected with Matthew Ortiz. on 04/09/21 at  1:30 PM EST by a video enabled telemedicine application and verified that I am speaking with the correct person using two identifiers.  Location: Patient: home Provider: office   I discussed the limitations of evaluation and management by telemedicine and the availability of in person appointments. The patient expressed understanding and agreed to proceed.  History of Present Illness: Matthew Ortiz is no longer working with his long time therapist, Mateo Flow. They stopped at end of September. He states he is doing ok without her. Matthew Ortiz has been having a lot of marital problems and they were fighting but had a good talk 3 days ago and he hopes that things will continue to improve. Matthew Ortiz has been spending most of his time at home due to his hypervigilance. He is admitting to isolation. It is taking him a while to fall asleep and he is having frequent nightmares. Matthew Ortiz also shares he is having housing and financial stressors. He is tired of trying to make new friends and feels the best thing he can do for himself to stay put. He thinks he has been dropped by his friend group in [place]. He states he is ok being alone because it saves him from dealing with heartache. Matthew Ortiz denies any SI/HI in the last 1 week. Prior he thinks he might have been heading down that road. He doesn't feel happy or any joy right now. His depression is worse than the PTSD right now. The skin picking is very, very minimal and he is proud of how his hands look right now. Matthew Ortiz sometimes has increased energy and will shadow box. It will make him happy and euphoric. It is very relaxing and makes him closer to God. He will do this for 15 min and he will feel exhausted afterwards. The euphoric feeling will last for a few hours afterwards He tends to impulsively spend money during the euphoric feeling. Those nights he has more trouble falling asleep.  Mostly it  is in the evening or night hours. It occurs for a hours about 3-4x/week in the last 2 months. This week it has not happened at all in at least one week lately.    Observations/Objective: Psychiatric Specialty Exam: ROS  There were no vitals taken for this visit.There is no height or weight on file to calculate BMI.  General Appearance: Casual  Eye Contact:  Fair  Speech:  Clear and Coherent and Normal Rate  Volume:  Normal  Mood:  Anxious and Depressed  Affect:  Congruent and irritable  Thought Process:  Coherent and Descriptions of Associations: Circumstantial  Orientation:  Full (Time, Place, and Person)  Thought Content:  Rumination  Suicidal Thoughts:  No  Homicidal Thoughts:  No  Memory:  Immediate;   Good  Judgement:  Fair  Insight:  Good  Psychomotor Activity:  Normal  Concentration:  Concentration: Good  Recall:  Good  Fund of Knowledge:  Good  Language:  Good  Akathisia:  No  Handed:  Right  AIMS (if indicated):     Assets:  Communication Skills Desire for Improvement Financial Resources/Insurance Housing Resilience Talents/Skills Transportation Vocational/Educational  ADL's:  Intact  Cognition:  WNL  Sleep:        Assessment and Plan: Depression screen [facility] 2/9 09/18/2020 07/10/2020 11/15/2019 02/28/2019 01/19/2018  Decreased Interest 2 1 2  0 0  Down, Depressed, Hopeless 3 3 2  0 0  PHQ - 2 Score 5 4  4 0 0  Altered sleeping 2 2 3  - -  Tired, decreased energy 3 2 1  - -  Change in appetite 3 2 - - -  Feeling bad or failure about yourself  3 2 - - -  Trouble concentrating 2 2 2  - -  Moving slowly or fidgety/restless 0 2 1 - -  Suicidal thoughts 2 2 0 - -  PHQ-9 Score 20 18 11  - -  Difficult doing work/chores Very difficult Somewhat difficult Somewhat difficult - -    Flowsheet Row Video Visit from 09/18/2020 in [facility] Video Visit from 07/10/2020 in [facility]  C-SSRS RISK  CATEGORY Error: Q7 should not be populated when Q6 is No Error: Q3, 4, or 5 should not be populated when Q2 is No       Matthew Ortiz is no longer in therapy. They were meeting 2x/week but he started feeling that it was no longer helping so he opted to stop at the end of September. He is taking a break and is planning on finding a new therapist in the future.   Matthew Ortiz had his Lithium drawn at the [place] about 1 month and states the Lithium level is 0.86. He will mail in a copy of all his recent lab work.   - increase Lithium to 600mg  qAM and 900mg  qPM  There are no diagnoses linked to this encounter.   Follow Up Instructions: In 2-3 weeks or sooner if needed   I discussed the assessment and treatment plan with the patient. The patient was provided an opportunity to ask questions and all were answered. The patient agreed with the plan and demonstrated an understanding of the instructions.   The patient was advised to call back or seek an in-person evaluation if the symptoms worsen or if the condition fails to improve as anticipated.  I provided 26 minutes of non-face-to-face time during this encounter.   Charlcie Cradle, MD

## 2021-05-07 ENCOUNTER — Other Ambulatory Visit: Payer: Self-pay

## 2021-05-07 ENCOUNTER — Telehealth (HOSPITAL_BASED_OUTPATIENT_CLINIC_OR_DEPARTMENT_OTHER): Payer: Medicare PPO | Admitting: Psychiatry

## 2021-05-07 DIAGNOSIS — F411 Generalized anxiety disorder: Secondary | ICD-10-CM | POA: Diagnosis not present

## 2021-05-07 DIAGNOSIS — F4310 Post-traumatic stress disorder, unspecified: Secondary | ICD-10-CM | POA: Diagnosis not present

## 2021-05-07 DIAGNOSIS — F5105 Insomnia due to other mental disorder: Secondary | ICD-10-CM

## 2021-05-07 DIAGNOSIS — F424 Excoriation (skin-picking) disorder: Secondary | ICD-10-CM | POA: Diagnosis not present

## 2021-05-07 DIAGNOSIS — F3181 Bipolar II disorder: Secondary | ICD-10-CM | POA: Diagnosis not present

## 2021-05-07 MED ORDER — TRAZODONE HCL 100 MG PO TABS: 200.0000 mg | ORAL_TABLET | Freq: Every day | ORAL | 0 refills | Status: DC
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
## 2021-06-18 ENCOUNTER — Encounter (HOSPITAL_COMMUNITY): Payer: Self-pay | Admitting: Psychiatry

## 2021-06-18 ENCOUNTER — Telehealth (HOSPITAL_BASED_OUTPATIENT_CLINIC_OR_DEPARTMENT_OTHER): Payer: Medicare PPO | Admitting: Psychiatry

## 2021-06-18 ENCOUNTER — Other Ambulatory Visit: Payer: Self-pay

## 2021-06-18 DIAGNOSIS — F411 Generalized anxiety disorder: Secondary | ICD-10-CM

## 2021-06-18 DIAGNOSIS — F5105 Insomnia due to other mental disorder: Secondary | ICD-10-CM

## 2021-06-18 DIAGNOSIS — F3181 Bipolar II disorder: Secondary | ICD-10-CM

## 2021-06-18 DIAGNOSIS — F4310 Post-traumatic stress disorder, unspecified: Secondary | ICD-10-CM | POA: Diagnosis not present

## 2021-06-18 DIAGNOSIS — F424 Excoriation (skin-picking) disorder: Secondary | ICD-10-CM | POA: Diagnosis not present

## 2021-06-18 MED ORDER — ESCITALOPRAM OXALATE 10 MG PO TABS: 30.0000 mg | ORAL_TABLET | Freq: Every day | ORAL | 0 refills | Status: DC

## 2021-06-18 MED ORDER — TRAZODONE HCL 100 MG PO TABS: 200.0000 mg | ORAL_TABLET | Freq: Every day | ORAL | 0 refills | Status: DC

## 2021-06-18 MED ORDER — LITHIUM CARBONATE 300 MG PO CAPS: ORAL_CAPSULE | ORAL | 0 refills | Status: DC

## 2021-06-18 MED ORDER — CLONAZEPAM 1 MG PO TBDP: 1.0000 mg | ORAL_TABLET | Freq: Three times a day (TID) | ORAL | 1 refills | Status: DC | PRN

## 2021-06-18 MED ORDER — MIRTAZAPINE 30 MG PO TABS: 60.0000 mg | ORAL_TABLET | Freq: Every day | ORAL | 0 refills | Status: DC

## 2021-06-18 NOTE — Progress Notes (Signed)
Virtual Visit via Video Note  I connected with Matthew Ortiz. on 06/18/21 at  2:15 PM EST by televideo platform and verified that I am speaking with the correct person using two identifiers.He was unable to get mychart to work.   Location: Patient: home Provider: office   I discussed the limitations, risks, security and privacy concerns of performing an evaluation and management service by telephone and the availability of in person appointments. I also discussed with the patient that there may be a patient responsible charge related to this service. The patient expressed understanding and agreed to proceed.   History of Present Illness: Matthew Ortiz is having increased PTSD symptoms due to increased stress. His wife had knee replacement surgery yesterday and he is caring for her. The majority of care falls on him and it is overwhelming and tiring. He is having severe nightmares and hypervigilance. He is not sleeping well and is exhausted. He is stress eating and has gained 10 lbs in the 3 months. His motivation is low to go out and do things is very low.  Matthew Ortiz has picking at his skin again. This is usually a sign that his anxiety is high. He knows things will improve as his wife recovers but it is hard to deal with right now. He is depressed every day and finds no enjoyment in things. Pt denies recent manic and hypomanic symptoms including periods of decreased need for sleep, increased energy, mood lability, impulsivity, FOI, and excessive spending. Matthew Ortiz denies SI/HI. When stressed he has on/off passive thoughts of death. Matthew Ortiz has been working on saying no.     Observations/Objective: Psychiatric Specialty Exam: ROS  There were no vitals taken for this visit.There is no height or weight on file to calculate BMI.  General Appearance: Casual  Eye Contact:  Good  Speech:  Clear and Coherent and Normal Rate  Volume:  Normal  Mood:  Anxious and Depressed  Affect:  Congruent  Thought Process:   Coherent and Descriptions of Associations: Circumstantial  Orientation:  Full (Time, Place, and Person)  Thought Content:  Rumination  Suicidal Thoughts:  No  Homicidal Thoughts:  No  Memory:  Immediate;   Good  Judgement:  Good  Insight:  Good  Psychomotor Activity:  Normal  Concentration:  Concentration: Good  Recall:  Good  Fund of Knowledge:  Good  Language:  Good  Akathisia:  No  Handed:  Right  AIMS (if indicated):     Assets:  Communication Skills Desire for Improvement Financial Resources/Insurance Housing Intimacy Resilience Talents/Skills Transportation Vocational/Educational  ADL's:  Intact  Cognition:  WNL  Sleep:         Assessment and Plan: Depression screen [facility] 2/9 06/18/2021 05/07/2021 09/18/2020 07/10/2020 11/15/2019  Decreased Interest 3 1 2 1 2   Down, Depressed, Hopeless 3 2 3 3 2   PHQ - 2 Score 6 3 5 4 4   Altered sleeping 3 3 2 2 3   Tired, decreased energy 3 3 3 2 1   Change in appetite 3 3 3 2  -  Feeling bad or failure about yourself  3 2 3 2  -  Trouble concentrating 2 2 2 2 2   Moving slowly or fidgety/restless 0 0 0 2 1  Suicidal thoughts 1 0 2 2 0  PHQ-9 Score 21 16 20 18 11   Difficult doing work/chores Very difficult Very difficult Very difficult Somewhat difficult Somewhat difficult    Flowsheet Row Video Visit from 06/18/2021 in [facility] Video Visit from  05/07/2021 in [facility] Video Visit from 09/18/2020 in [place] Error: Q3, 4, or 5 should not be populated when Q2 is No No Risk Error: Q7 should not be populated when Q6 is No       1. PTSD (post-traumatic stress disorder) - clonazePAM (KLONOPIN) 1 MG disintegrating tablet; Take 1 tablet (1 mg total) by mouth 3 (three) times daily as needed.  Dispense: 90 tablet; Refill: 1 - escitalopram (LEXAPRO) 10 MG tablet; Take 3 tablets (30 mg total) by mouth  daily.  Dispense: 270 tablet; Refill: 0 - mirtazapine (REMERON) 30 MG tablet; Take 2 tablets (60 mg total) by mouth at bedtime.  Dispense: 180 tablet; Refill: 0  2. GAD (generalized anxiety disorder) - clonazePAM (KLONOPIN) 1 MG disintegrating tablet; Take 1 tablet (1 mg total) by mouth 3 (three) times daily as needed.  Dispense: 90 tablet; Refill: 1 - escitalopram (LEXAPRO) 10 MG tablet; Take 3 tablets (30 mg total) by mouth daily.  Dispense: 270 tablet; Refill: 0 - mirtazapine (REMERON) 30 MG tablet; Take 2 tablets (60 mg total) by mouth at bedtime.  Dispense: 180 tablet; Refill: 0  3. Skin-picking disorder - escitalopram (LEXAPRO) 10 MG tablet; Take 3 tablets (30 mg total) by mouth daily.  Dispense: 270 tablet; Refill: 0  4. Bipolar II disorder (HCC) - lithium carbonate 300 MG capsule; Take 2 capsules (600 mg total) by mouth daily with breakfast AND 3 capsules (900 mg total) daily after supper.  Dispense: 450 capsule; Refill: 0  5. Insomnia related to another mental disorder - mirtazapine (REMERON) 30 MG tablet; Take 2 tablets (60 mg total) by mouth at bedtime.  Dispense: 180 tablet; Refill: 0 - traZODone (DESYREL) 100 MG tablet; Take 2 tablets (200 mg total) by mouth at bedtime.  Dispense: 180 tablet; Refill: 0    Follow Up Instructions: In 1-2 months or sooner if needed   I discussed the assessment and treatment plan with the patient. The patient was provided an opportunity to ask questions and all were answered. The patient agreed with the plan and demonstrated an understanding of the instructions.   The patient was advised to call back or seek an in-person evaluation if the symptoms worsen or if the condition fails to improve as anticipated.  I provided 20 minutes of non-face-to-face time during this encounter.   Charlcie Cradle, MD

## 2021-08-27 ENCOUNTER — Telehealth (HOSPITAL_BASED_OUTPATIENT_CLINIC_OR_DEPARTMENT_OTHER): Payer: Medicare PPO | Admitting: Psychiatry

## 2021-08-27 DIAGNOSIS — F424 Excoriation (skin-picking) disorder: Secondary | ICD-10-CM | POA: Diagnosis not present

## 2021-08-27 DIAGNOSIS — F4310 Post-traumatic stress disorder, unspecified: Secondary | ICD-10-CM

## 2021-08-27 DIAGNOSIS — E118 Type 2 diabetes mellitus with unspecified complications: Secondary | ICD-10-CM

## 2021-08-27 DIAGNOSIS — F3181 Bipolar II disorder: Secondary | ICD-10-CM

## 2021-08-27 DIAGNOSIS — F5105 Insomnia due to other mental disorder: Secondary | ICD-10-CM

## 2021-08-27 DIAGNOSIS — F411 Generalized anxiety disorder: Secondary | ICD-10-CM | POA: Diagnosis not present

## 2021-08-27 MED ORDER — LITHIUM CARBONATE 300 MG PO CAPS: ORAL_CAPSULE | ORAL | 0 refills | Status: DC

## 2021-08-27 MED ORDER — MIRTAZAPINE 30 MG PO TABS: 60.0000 mg | ORAL_TABLET | Freq: Every day | ORAL | 0 refills | Status: DC

## 2021-08-27 MED ORDER — CLONAZEPAM 1 MG PO TBDP: 1.0000 mg | ORAL_TABLET | Freq: Three times a day (TID) | ORAL | 2 refills | Status: DC | PRN

## 2021-08-27 MED ORDER — ESCITALOPRAM OXALATE 10 MG PO TABS: 30.0000 mg | ORAL_TABLET | Freq: Every day | ORAL | 0 refills | Status: DC

## 2021-08-27 MED ORDER — TRAZODONE HCL 100 MG PO TABS: 200.0000 mg | ORAL_TABLET | Freq: Every day | ORAL | 0 refills | Status: DC

## 2021-08-27 NOTE — Progress Notes (Signed)
Virtual Visit via Phone Note ? ?I connected with Matthew Ortiz. on 08/27/21 at  1:30 PM EDT by phone and verified that I am speaking with the correct person using two identifiers. We attempted to connect by video many times but were unable to do so.  ? ?Location: ?Patient: home ?Provider: office ?  ?I discussed the limitations of evaluation and management by telemedicine and the availability of in person appointments. The patient expressed understanding and agreed to proceed. ? ?History of Present Illness: ?"I'm doing ok. I would not say anything super grand". Matthew Ortiz is still recovering from knee replacement. [name] does not give him privacy when he has private calls. Matthew Ortiz has talked to her about it multiple times and she still has changed. It is frustrating that he has so little privacy or time to himself while helping her recover from surgery. Dealing with her is hard. He is making friends with his new neighbor. Matthew Ortiz is depressed but it is unchanged.Marland Kitchen He is thinking about taking up writing again. A friend is encouraging him to start but Matthew Ortiz is not in the appropriate mind frame for it yet. He is not sleeping well. His PTSD is unchanged. Matthew Ortiz admits to having on/off SI without plan or intent. A lot of it has to do with dealing with [name].The last time he had SI was 1 week ago.  He denies HI.  ?  ?Observations/Objective: ?Psychiatric Specialty Exam: ? ?General Appearance: unable to assess  ?Eye Contact:  unable to assess  ?Speech:  Clear and Coherent and Normal Rate  ?Volume:  Normal  ?Mood:  Anxious and Depressed  ?Affect:  Congruent  ?Thought Process:  Coherent and Descriptions of Associations: Circumstantial  ?Orientation:  Full (Time, Place, and Person)  ?Thought Content:  Rumination  ?Suicidal Thoughts:  No  ?Homicidal Thoughts:  No  ?Memory:  Immediate;   Good  ?Judgement:  Fair  ?Insight:  Fair  ?Psychomotor Activity: unable to assess  ?Concentration:  Concentration: Good  ?Recall:   Good  ?Fund of Knowledge:  Good  ?Language:  Good  ?Akathisia:  unable to assess  ?Handed:  unable to assess  ?AIMS (if indicated):     ?Assets:  Communication Skills ?Desire for Improvement ?Financial Resources/Insurance ?Housing ?Leisure Time ?Resilience ?Talents/Skills ?Transportation ?Vocational/Educational  ?ADL's:  unable to assess  ?Cognition:  WNL  ?Sleep:     ? ? ? ? ?Assessment and Plan: ? ? ?  06/18/2021  ?  2:29 PM 05/07/2021  ?  2:47 PM 09/18/2020  ?  1:45 PM 07/10/2020  ?  3:01 PM 11/15/2019  ? 10:31 AM  ?Depression screen PHQ 2/9  ?Decreased Interest '3 1 2 1 2  '$ ?Down, Depressed, Hopeless '3 2 3 3 2  '$ ?PHQ - 2 Score '6 3 5 4 4  '$ ?Altered sleeping '3 3 2 2 3  '$ ?Tired, decreased energy '3 3 3 2 1  '$ ?Change in appetite '3 3 3 2   '$ ?Feeling bad or failure about yourself  '3 2 3 2   '$ ?Trouble concentrating '2 2 2 2 2  '$ ?Moving slowly or fidgety/restless 0 0 0 2 1  ?Suicidal thoughts 1 0 2 2 0  ?PHQ-9 Score '21 16 20 18 11  '$ ?Difficult doing work/chores Very difficult Very difficult Very difficult Somewhat difficult Somewhat difficult  ? ? ?Flowsheet Row Video Visit from 06/18/2021 in [facility] Video Visit from 05[facility]07/2021 in [facility] Video [facility]  ?C-SSRS RISK CATEGORY Error: Q3, 4, or 5 should not be populated when Q2 is No No Risk Error: Q7 should not be populated when Q6 is No  ? ?  ? ?Status of current problems: ongoing depression and PTSD ? ?Meds:  ?1. Type 2 diabetes mellitus with complication, without long-term current use of insulin ([place]) ? ?2. PTSD (post-traumatic stress disorder) ?- clonazePAM (KLONOPIN) 1 MG disintegrating tablet; Take 1 tablet (1 mg total) by mouth 3 (three) times daily as needed.  Dispense: 90 tablet; Refill: 2 ?- escitalopram (LEXAPRO) 10 MG tablet; Take 3 tablets (30 mg total) by mouth daily.  Dispense: 270 tablet; Refill: 0 ?- mirtazapine (REMERON)  30 MG tablet; Take 2 tablets (60 mg total) by mouth at bedtime.  Dispense: 180 tablet; Refill: 0 ? ?3. GAD (generalized anxiety disorder) ?- clonazePAM (KLONOPIN) 1 MG disintegrating tablet; Take 1 tablet (1 mg total) by mouth 3 (three) times daily as needed.  Dispense: 90 tablet; Refill: 2 ?- escitalopram (LEXAPRO) 10 MG tablet; Take 3 tablets (30 mg total) by mouth daily.  Dispense: 270 tablet; Refill: 0 ?- mirtazapine (REMERON) 30 MG tablet; Take 2 tablets (60 mg total) by mouth at bedtime.  Dispense: 180 tablet; Refill: 0 ? ?4. Skin-picking disorder ?- escitalopram (LEXAPRO) 10 MG tablet; Take 3 tablets (30 mg total) by mouth daily.  Dispense: 270 tablet; Refill: 0 ? ?5. Bipolar II disorder (HCC) ?- lithium carbonate 300 MG capsule; Take 2 capsules (600 mg total) by mouth daily with breakfast AND 3 capsules (900 mg total) daily after supper.  Dispense: 450 capsule; Refill: 0 ? ?6. Insomnia related to another mental disorder ?- mirtazapine (REMERON) 30 MG tablet; Take 2 tablets (60 mg total) by mouth at bedtime.  Dispense: 180 tablet; Refill: 0 ?- traZODone (DESYREL) 100 MG tablet; Take 2 tablets (200 mg total) by mouth at bedtime.  Dispense: 180 tablet; Refill: 0 ? ? ? ?Therapy: brief supportive therapy provided. Discussed psychosocial stressors in detail.   ? ? ? ?Collaboration of Care: Referral or follow-up with counselor/therapist AEB therapy ? ?Patient/Guardian was advised Release of Information must be obtained prior to any record release in order to collaborate their care with an outside provider. Patient/Guardian was advised if they have not already done so to contact the registration department to sign all necessary forms in order for [place] to release information regarding their care.  ? ?Consent: Patient/Guardian gives verbal consent for treatment and assignment of benefits for services provided during this visit. Patient/Guardian expressed understanding and agreed to proceed.  ? ? ? ? ?Follow Up  Instructions: ?Follow up in 2-3 months or sooner if needed ?  ? ?I discussed the assessment and treatment plan with the patient. The patient was provided an opportunity to ask questions and all were answered. The patient agreed with the plan and demonstrated an understanding of the instructions. ?  ?The patient was advised to call back or seek an in-person evaluation if the symptoms worsen or if the condition fails to improve as anticipated. ? ?I provided 21 minutes of non-face-to-face time during this encounter. ? ? ?Charlcie Cradle, MD ? ?

## 2021-11-05 ENCOUNTER — Telehealth (HOSPITAL_BASED_OUTPATIENT_CLINIC_OR_DEPARTMENT_OTHER): Payer: Medicare PPO | Admitting: Psychiatry

## 2021-11-05 ENCOUNTER — Encounter (HOSPITAL_COMMUNITY): Payer: Self-pay | Admitting: Psychiatry

## 2021-11-05 DIAGNOSIS — F411 Generalized anxiety disorder: Secondary | ICD-10-CM | POA: Diagnosis not present

## 2021-11-05 DIAGNOSIS — F4310 Post-traumatic stress disorder, unspecified: Secondary | ICD-10-CM

## 2021-11-05 DIAGNOSIS — F424 Excoriation (skin-picking) disorder: Secondary | ICD-10-CM

## 2021-11-05 DIAGNOSIS — F3181 Bipolar II disorder: Secondary | ICD-10-CM

## 2021-11-05 DIAGNOSIS — F5105 Insomnia due to other mental disorder: Secondary | ICD-10-CM

## 2021-11-05 MED ORDER — TRAZODONE HCL 100 MG PO TABS: 200.0000 mg | ORAL_TABLET | Freq: Every day | ORAL | 0 refills | Status: DC

## 2021-11-05 MED ORDER — MIRTAZAPINE 30 MG PO TABS: 60.0000 mg | ORAL_TABLET | Freq: Every day | ORAL | 0 refills | Status: DC

## 2021-11-05 MED ORDER — LITHIUM CARBONATE 300 MG PO CAPS: ORAL_CAPSULE | ORAL | 0 refills | Status: DC

## 2021-11-05 MED ORDER — CLONAZEPAM 1 MG PO TBDP: 1.0000 mg | ORAL_TABLET | Freq: Three times a day (TID) | ORAL | 1 refills | Status: DC | PRN

## 2021-11-05 MED ORDER — ESCITALOPRAM OXALATE 10 MG PO TABS: 30.0000 mg | ORAL_TABLET | Freq: Every day | ORAL | 0 refills | Status: DC
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
## 2021-12-04 ENCOUNTER — Other Ambulatory Visit (HOSPITAL_COMMUNITY): Payer: Self-pay | Admitting: Psychiatry

## 2021-12-04 DIAGNOSIS — F5105 Insomnia due to other mental disorder: Secondary | ICD-10-CM

## 2021-12-20 ENCOUNTER — Other Ambulatory Visit (HOSPITAL_COMMUNITY): Payer: Self-pay | Admitting: Psychiatry

## 2021-12-20 DIAGNOSIS — F3181 Bipolar II disorder: Secondary | ICD-10-CM

## 2022-01-14 ENCOUNTER — Telehealth (HOSPITAL_BASED_OUTPATIENT_CLINIC_OR_DEPARTMENT_OTHER): Payer: Medicare PPO | Admitting: Psychiatry

## 2022-01-14 ENCOUNTER — Encounter (HOSPITAL_COMMUNITY): Payer: Self-pay | Admitting: Psychiatry

## 2022-01-14 DIAGNOSIS — F411 Generalized anxiety disorder: Secondary | ICD-10-CM | POA: Diagnosis not present

## 2022-01-14 DIAGNOSIS — F3181 Bipolar II disorder: Secondary | ICD-10-CM | POA: Diagnosis not present

## 2022-01-14 DIAGNOSIS — F4310 Post-traumatic stress disorder, unspecified: Secondary | ICD-10-CM

## 2022-01-14 DIAGNOSIS — F5105 Insomnia due to other mental disorder: Secondary | ICD-10-CM

## 2022-01-14 DIAGNOSIS — F424 Excoriation (skin-picking) disorder: Secondary | ICD-10-CM | POA: Diagnosis not present

## 2022-01-14 MED ORDER — N-ACETYL-L-CYSTEINE POWD: 1.0000 [IU]/d | Freq: Every day | 0 refills | Status: DC

## 2022-01-14 MED ORDER — TRAZODONE HCL 100 MG PO TABS: 200.0000 mg | ORAL_TABLET | Freq: Every day | ORAL | 0 refills | Status: DC

## 2022-01-14 MED ORDER — CLONAZEPAM 1 MG PO TBDP: 1.0000 mg | ORAL_TABLET | Freq: Three times a day (TID) | ORAL | 2 refills | Status: DC | PRN

## 2022-01-14 MED ORDER — LITHIUM CARBONATE 300 MG PO CAPS: ORAL_CAPSULE | ORAL | 0 refills | Status: DC

## 2022-01-14 MED ORDER — MIRTAZAPINE 30 MG PO TABS: 60.0000 mg | ORAL_TABLET | Freq: Every day | ORAL | 0 refills | Status: DC

## 2022-01-14 MED ORDER — ESCITALOPRAM OXALATE 10 MG PO TABS: 30.0000 mg | ORAL_TABLET | Freq: Every day | ORAL | 0 refills | Status: DC

## 2022-01-14 NOTE — Progress Notes (Signed)
Virtual Visit via Video Note  I connected with Matthew Farr. on 01/14/22 at  3:00 PM EDT by   a video enabled telemedicine application and verified that I am speaking with the correct person using two identifiers.  Location: Patient: home Provider: office   I discussed the limitations of evaluation and management by telemedicine and the availability of in person appointments. The patient expressed understanding and agreed to proceed.  History of Present Illness: Matthew Ortiz shares that he has ongoing stressors. His wife is suffering from multiple dental problems that are very expensive to fix. One of his dogs has bladder cancer. He is trying to keep things in perspective. His own health has been put "on the back burner". Matthew Ortiz has been having tightening in his chest and throat. Feels more anxious at night before he goes to bed. He sometimes wakes up with a panic attack about 2x/week. It takes him about an hour to fall asleep and then wakes up at 4:30am. He is having nightmares a couple of times a week and the theme is always someone is trying to kill him or him dying somehow. During the day he has some anxiety but it is manageable. Most days he stays at home and this helps to keep his PTSD stable. His hypervigilance is high anytime he leaves home. Matthew Ortiz is angry and depressed but is managing. Other than anger/irritability he denies manic or hypomanic like symptoms or episodes. He denies SI/HI. He has been doing some skin picking.     Observations/Objective: Psychiatric Specialty Exam: ROS  There were no vitals taken for this visit.There is no height or weight on file to calculate BMI.  General Appearance: Casual  Eye Contact:  Good  Speech:  Clear and Coherent and Normal Rate  Volume:  Normal  Mood:  Anxious and Depressed  Affect:  Congruent  Thought Process:  Coherent and Descriptions of Associations: Circumstantial  Orientation:  Full (Time, Place, and Person)  Thought Content:  Logical   Suicidal Thoughts:  No  Homicidal Thoughts:  No  Memory:  Immediate;   Good  Judgement:  Good  Insight:  Good  Psychomotor Activity:  Normal  Concentration:  Concentration: Good  Recall:  Good  Fund of Knowledge:  Good  Language:  Good  Akathisia:  No  Handed:  Right  AIMS (if indicated):     Assets:  Communication Skills Desire for Improvement Financial Resources/Insurance Housing Intimacy Leisure Time Resilience Talents/Skills Transportation Vocational/Educational  ADL's:  Intact  Cognition:  WNL  Sleep:        Assessment and Plan:     11/05/2021   10:22 AM 06/18/2021    2:29 PM 05/07/2021    2:47 PM 09/18/2020    1:45 PM 07/10/2020    3:01 PM  Depression screen PHQ 2/9  Decreased Interest '2 3 1 2 1  '$ Down, Depressed, Hopeless '3 3 2 3 3  '$ PHQ - 2 Score '5 6 3 5 4  '$ Altered sleeping '3 3 3 2 2  '$ Tired, decreased energy '3 3 3 3 2  '$ Change in appetite '3 3 3 3 2  '$ Feeling bad or failure about yourself  '3 3 2 3 2  '$ Trouble concentrating '2 2 2 2 2  '$ Moving slowly or fidgety/restless 0 0 0 0 2  Suicidal thoughts 3 1 0 2 2  PHQ-9 Score '22 21 16 20 18  '$ Difficult doing work/chores Extremely dIfficult Very difficult Very difficult Very difficult Somewhat difficult    Flowsheet Row  Video Visit from 11/05/2021 in [facility] Video Visit from 06/18/2021 in [facility] Video Visit from 05/07/2021 in [facility]  C-SSRS RISK CATEGORY Error: Q7 should not be populated when Q6 is No Error: Q3, 4, or 5 should not be populated when Q2 is No No Risk          Status of current problems: ongoing depression, anxiety and PTSD  Meds: Matthew Ortiz does not want his meds changed at this time. 1. GAD (generalized anxiety disorder) - clonazePAM (KLONOPIN) 1 MG disintegrating tablet; Take 1 tablet (1 mg total) by mouth 3 (three) times daily as needed.  Dispense: 90 tablet; Refill: 2 -  escitalopram (LEXAPRO) 10 MG tablet; Take 3 tablets (30 mg total) by mouth daily.  Dispense: 270 tablet; Refill: 0 - mirtazapine (REMERON) 30 MG tablet; Take 2 tablets (60 mg total) by mouth at bedtime.  Dispense: 180 tablet; Refill: 0  2. Bipolar II disorder (HCC) - lithium carbonate 300 MG capsule; Take 2 capsules (600 mg total) by mouth daily with breakfast AND 3 capsules (900 mg total) daily after supper.  Dispense: 450 capsule; Refill: 0  3. Skin-picking disorder - escitalopram (LEXAPRO) 10 MG tablet; Take 3 tablets (30 mg total) by mouth daily.  Dispense: 270 tablet; Refill: 0 - Acetylcysteine (N-ACETYL-L-CYSTEINE) POWD; 1 Units/day by Does not apply route daily.  Dispense: 30 g; Refill: 0  4. PTSD (post-traumatic stress disorder) - clonazePAM (KLONOPIN) 1 MG disintegrating tablet; Take 1 tablet (1 mg total) by mouth 3 (three) times daily as needed.  Dispense: 90 tablet; Refill: 2 - escitalopram (LEXAPRO) 10 MG tablet; Take 3 tablets (30 mg total) by mouth daily.  Dispense: 270 tablet; Refill: 0 - mirtazapine (REMERON) 30 MG tablet; Take 2 tablets (60 mg total) by mouth at bedtime.  Dispense: 180 tablet; Refill: 0  5. Insomnia related to another mental disorder - mirtazapine (REMERON) 30 MG tablet; Take 2 tablets (60 mg total) by mouth at bedtime.  Dispense: 180 tablet; Refill: 0 - traZODone (DESYREL) 100 MG tablet; Take 2 tablets (200 mg total) by mouth at bedtime.  Dispense: 180 tablet; Refill: 0     Labs: Matthew Ortiz will send in a copy of his recent lab work in mid 2023    Therapy: brief supportive therapy provided. Discussed psychosocial stressors in detail.      Collaboration of Care: Other none  Patient/Guardian was advised Release of Information must be obtained prior to any record release in order to collaborate their care with an outside provider. Patient/Guardian was advised if they have not already done so to contact the registration department to sign all necessary forms  in order for [place] to release information regarding their care.   Consent: Patient/Guardian gives verbal consent for treatment and assignment of benefits for services provided during this visit. Patient/Guardian expressed understanding and agreed to proceed.      Follow Up Instructions: Follow up in 2-3 months or sooner if needed    I discussed the assessment and treatment plan with the patient. The patient was provided an opportunity to ask questions and all were answered. The patient agreed with the plan and demonstrated an understanding of the instructions.   The patient was advised to call back or seek an in-person evaluation if the symptoms worsen or if the condition fails to improve as anticipated.  I provided 21 minutes of non-face-to-face time during this encounter.   Charlcie Cradle, MD

## 2022-03-18 ENCOUNTER — Telehealth (HOSPITAL_BASED_OUTPATIENT_CLINIC_OR_DEPARTMENT_OTHER): Payer: Medicare PPO | Admitting: Psychiatry

## 2022-03-18 DIAGNOSIS — Z79899 Other long term (current) drug therapy: Secondary | ICD-10-CM

## 2022-03-18 DIAGNOSIS — F411 Generalized anxiety disorder: Secondary | ICD-10-CM | POA: Diagnosis not present

## 2022-03-18 DIAGNOSIS — F5105 Insomnia due to other mental disorder: Secondary | ICD-10-CM

## 2022-03-18 DIAGNOSIS — F424 Excoriation (skin-picking) disorder: Secondary | ICD-10-CM

## 2022-03-18 DIAGNOSIS — F4310 Post-traumatic stress disorder, unspecified: Secondary | ICD-10-CM

## 2022-03-18 DIAGNOSIS — F3181 Bipolar II disorder: Secondary | ICD-10-CM | POA: Diagnosis not present

## 2022-03-18 DIAGNOSIS — Z5181 Encounter for therapeutic drug level monitoring: Secondary | ICD-10-CM

## 2022-03-18 MED ORDER — ESCITALOPRAM OXALATE 10 MG PO TABS: 30.0000 mg | ORAL_TABLET | Freq: Every day | ORAL | 0 refills | Status: DC

## 2022-03-18 MED ORDER — CLONAZEPAM 1 MG PO TBDP: 1.0000 mg | ORAL_TABLET | Freq: Three times a day (TID) | ORAL | 1 refills | Status: DC | PRN

## 2022-03-18 MED ORDER — LITHIUM CARBONATE 300 MG PO CAPS: ORAL_CAPSULE | ORAL | 0 refills | Status: DC

## 2022-03-18 MED ORDER — TRAZODONE HCL 100 MG PO TABS: 200.0000 mg | ORAL_TABLET | Freq: Every day | ORAL | 0 refills | Status: DC

## 2022-03-18 MED ORDER — MIRTAZAPINE 30 MG PO TABS: 60.0000 mg | ORAL_TABLET | Freq: Every day | ORAL | 0 refills | Status: DC

## 2022-03-18 NOTE — Progress Notes (Signed)
Virtual Visit via Video Note  I connected with Matthew Ortiz. on 03/18/22 at  2:30 PM EST by  a video enabled telemedicine application and verified that I am speaking with the correct person using two identifiers.  Location: Patient: home Provider: office   I discussed the limitations of evaluation and management by telemedicine and the availability of in person appointments. The patient expressed understanding and agreed to proceed.  History of Present Illness: Matthew Ortiz shares he is doing ok. His wife continues to have health problems and will be having a surgical procedure next month. She is also having some memory problems. Most days he is feeling depressed and somewhat hopeless due to the constant onset on new stressors. It is overwhelming because so much of the household responsibilities and care for his wife fall on him. He wakes up at 4am most nights and he is having nightmares every night. He admits to feeling tired most days. He is endorsing some anhedonia but states it is because he has very little time for himself. His appetite is fair but he is working on weight loss. He eats 1 meal/day and then light meals or snacks for the rest of the day. His focus is not great but he is managing. About 1-3 daysweek he has passive thoughts of death when overwhelmed. He denies SI/HI. He continues to experience anxiety and HV. The anxiety comes in waves and happens when he feels overwhelmed by doctors visits or household chores. He avoids going out when he can. He still has exaggerated startle reflex. He denies any recent skin picking. Matthew Ortiz denies any manic or hypomanic like symptoms or episodes. He is trying to take things one day to the next without losing his cool.    Observations/Objective: Psychiatric Specialty Exam: ROS  There were no vitals taken for this visit.There is no height or weight on file to calculate BMI.  General Appearance: Casual and Well Groomed  Eye Contact:  Good  Speech:   Clear and Coherent and Normal Rate  Volume:  Normal  Mood:  Anxious and Depressed  Affect:  Full Range  Thought Process:  Coherent and Descriptions of Associations: Circumstantial  Orientation:  Full (Time, Place, and Person)  Thought Content:  Rumination  Suicidal Thoughts:  No  Homicidal Thoughts:  No  Memory:  Immediate;   Good  Judgement:  Good  Insight:  Good  Psychomotor Activity:  Normal  Concentration:  Concentration: Good  Recall:  Good  Fund of Knowledge:  Good  Language:  Good  Akathisia:  No  Handed:  Right  AIMS (if indicated):     Assets:  Communication Skills Desire for Improvement Financial Resources/Insurance Housing Resilience Talents/Skills Transportation Vocational/Educational  ADL's:  Intact  Cognition:  WNL  Sleep:        Assessment and Plan:     03/18/2022    2:41 PM 11/05/2021   10:22 AM 06/18/2021    2:29 PM 05/07/2021    2:47 PM 09/18/2020    1:45 PM  Depression screen PHQ 2/9  Decreased Interest '2 2 3 1 2  '$ Down, Depressed, Hopeless '3 3 3 2 3  '$ PHQ - 2 Score '5 5 6 3 5  '$ Altered sleeping '3 3 3 3 2  '$ Tired, decreased energy '3 3 3 3 3  '$ Change in appetite 0 '3 3 3 3  '$ Feeling bad or failure about yourself  '3 3 3 2 3  '$ Trouble concentrating '2 2 2 2 2  '$ Moving slowly or fidgety/restless  0 0 0 0 0  Suicidal thoughts '1 3 1 '$ 0 2  PHQ-9 Score '17 22 21 16 20  '$ Difficult doing work/chores Very difficult Extremely dIfficult Very difficult Very difficult Very difficult    Flowsheet Row Video Visit from 03/18/2022 in [facility] Video Visit from 11/05/2021 in [facility] Video Visit from 06/18/2021 in [facility] Error: Q3, 4, or 5 should not be populated when Q2 is No Error: Q7 should not be populated when Q6 is No Error: Q3, 4, or 5 should not be populated when Q2 is No          Status of current problems: ongoing  depression, anxiety and PTSD symptoms  Meds:  1. Bipolar II disorder (HCC) - lithium carbonate 300 MG capsule; Take 2 capsules (600 mg total) by mouth daily with breakfast AND 3 capsules (900 mg total) daily after supper.  Dispense: 450 capsule; Refill: 0  2. PTSD (post-traumatic stress disorder) - escitalopram (LEXAPRO) 10 MG tablet; Take 3 tablets (30 mg total) by mouth daily.  Dispense: 270 tablet; Refill: 0 - clonazePAM (KLONOPIN) 1 MG disintegrating tablet; Take 1 tablet (1 mg total) by mouth 3 (three) times daily as needed.  Dispense: 90 tablet; Refill: 1 - mirtazapine (REMERON) 30 MG tablet; Take 2 tablets (60 mg total) by mouth at bedtime.  Dispense: 180 tablet; Refill: 0  3. Insomnia related to another mental disorder - mirtazapine (REMERON) 30 MG tablet; Take 2 tablets (60 mg total) by mouth at bedtime.  Dispense: 180 tablet; Refill: 0 - traZODone (DESYREL) 100 MG tablet; Take 2 tablets (200 mg total) by mouth at bedtime.  Dispense: 180 tablet; Refill: 0  4. GAD (generalized anxiety disorder) - escitalopram (LEXAPRO) 10 MG tablet; Take 3 tablets (30 mg total) by mouth daily.  Dispense: 270 tablet; Refill: 0 - clonazePAM (KLONOPIN) 1 MG disintegrating tablet; Take 1 tablet (1 mg total) by mouth 3 (three) times daily as needed.  Dispense: 90 tablet; Refill: 1 - mirtazapine (REMERON) 30 MG tablet; Take 2 tablets (60 mg total) by mouth at bedtime.  Dispense: 180 tablet; Refill: 0  5. Skin-picking disorder - escitalopram (LEXAPRO) 10 MG tablet; Take 3 tablets (30 mg total) by mouth daily.  Dispense: 270 tablet; Refill: 0  6. Encounter for long-term (current) use of medications - Comprehensive Metabolic Panel (CMET) - TSH  7. Encounter for therapeutic drug monitoring - Lithium level - Comprehensive Metabolic Panel (CMET) - TSH      Therapy: brief supportive therapy provided. Discussed psychosocial stressors in detail.    Collaboration of Care: Other none  Patient/Guardian  was advised Release of Information must be obtained prior to any record release in order to collaborate their care with an outside provider. Patient/Guardian was advised if they have not already done so to contact the registration department to sign all necessary forms in order for [place] to release information regarding their care.   Consent: Patient/Guardian gives verbal consent for treatment and assignment of benefits for services provided during this visit. Patient/Guardian expressed understanding and agreed to proceed.       Follow Up Instructions: Follow up in 2 months or sooner if needed    I discussed the assessment and treatment plan with the patient. The patient was provided an opportunity to ask questions and all were answered. The patient agreed with the plan and demonstrated an understanding of the instructions.   The patient was advised  to call back or seek an in-person evaluation if the symptoms worsen or if the condition fails to improve as anticipated.  I provided 19 minutes of non-face-to-face time during this encounter.   Charlcie Cradle, MD

## 2022-05-20 ENCOUNTER — Telehealth (HOSPITAL_BASED_OUTPATIENT_CLINIC_OR_DEPARTMENT_OTHER): Payer: Medicare PPO | Admitting: Psychiatry

## 2022-05-20 ENCOUNTER — Encounter (HOSPITAL_COMMUNITY): Payer: Self-pay | Admitting: Psychiatry

## 2022-05-20 DIAGNOSIS — F424 Excoriation (skin-picking) disorder: Secondary | ICD-10-CM

## 2022-05-20 DIAGNOSIS — F4310 Post-traumatic stress disorder, unspecified: Secondary | ICD-10-CM

## 2022-05-20 DIAGNOSIS — F411 Generalized anxiety disorder: Secondary | ICD-10-CM | POA: Diagnosis not present

## 2022-05-20 DIAGNOSIS — F5105 Insomnia due to other mental disorder: Secondary | ICD-10-CM

## 2022-05-20 DIAGNOSIS — F3181 Bipolar II disorder: Secondary | ICD-10-CM | POA: Diagnosis not present

## 2022-05-20 MED ORDER — LITHIUM CARBONATE 300 MG PO CAPS: ORAL_CAPSULE | ORAL | 0 refills | Status: DC

## 2022-05-20 MED ORDER — MIRTAZAPINE 30 MG PO TABS: 60.0000 mg | ORAL_TABLET | Freq: Every day | ORAL | 0 refills | Status: DC

## 2022-05-20 MED ORDER — ESCITALOPRAM OXALATE 10 MG PO TABS: 30.0000 mg | ORAL_TABLET | Freq: Every day | ORAL | 0 refills | Status: DC

## 2022-05-20 MED ORDER — CLONAZEPAM 1 MG PO TBDP: 1.0000 mg | ORAL_TABLET | Freq: Three times a day (TID) | ORAL | 2 refills | Status: DC | PRN

## 2022-05-20 MED ORDER — TRAZODONE HCL 100 MG PO TABS: 200.0000 mg | ORAL_TABLET | Freq: Every day | ORAL | 0 refills | Status: DC
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
## 2022-07-07 ENCOUNTER — Encounter: Payer: Self-pay | Admitting: Adult Health

## 2022-07-08 ENCOUNTER — Encounter: Payer: Self-pay | Admitting: Adult Health

## 2022-07-08 ENCOUNTER — Ambulatory Visit (INDEPENDENT_AMBULATORY_CARE_PROVIDER_SITE_OTHER): Payer: Medicare PPO | Admitting: Adult Health

## 2022-07-08 VITALS — BP 130/82 | HR 84 | Temp 98.2°F | Resp 17 | Ht 73.0 in | Wt 175.2 lb

## 2022-07-08 DIAGNOSIS — E118 Type 2 diabetes mellitus with unspecified complications: Secondary | ICD-10-CM | POA: Diagnosis not present

## 2022-07-08 DIAGNOSIS — Z125 Encounter for screening for malignant neoplasm of prostate: Secondary | ICD-10-CM

## 2022-07-08 DIAGNOSIS — Z13820 Encounter for screening for osteoporosis: Secondary | ICD-10-CM

## 2022-07-08 DIAGNOSIS — F5101 Primary insomnia: Secondary | ICD-10-CM

## 2022-07-08 DIAGNOSIS — M1A9XX0 Chronic gout, unspecified, without tophus (tophi): Secondary | ICD-10-CM

## 2022-07-08 DIAGNOSIS — J302 Other seasonal allergic rhinitis: Secondary | ICD-10-CM

## 2022-07-08 DIAGNOSIS — Z7689 Persons encountering health services in other specified circumstances: Secondary | ICD-10-CM | POA: Diagnosis not present

## 2022-07-08 DIAGNOSIS — N529 Male erectile dysfunction, unspecified: Secondary | ICD-10-CM

## 2022-07-08 DIAGNOSIS — I10 Essential (primary) hypertension: Secondary | ICD-10-CM | POA: Diagnosis not present

## 2022-07-08 DIAGNOSIS — F3181 Bipolar II disorder: Secondary | ICD-10-CM

## 2022-07-08 DIAGNOSIS — E785 Hyperlipidemia, unspecified: Secondary | ICD-10-CM

## 2022-07-08 DIAGNOSIS — J4530 Mild persistent asthma, uncomplicated: Secondary | ICD-10-CM

## 2022-07-08 DIAGNOSIS — I739 Peripheral vascular disease, unspecified: Secondary | ICD-10-CM

## 2022-07-08 NOTE — Progress Notes (Signed)
[facility]  Provider: Durenda Age DNP  Code Status:   Full Code  Goals of Care:     07/08/2022   12:45 PM  Advanced Directives  Does Patient Have a Medical Advance Directive? No     Chief Complaint  Patient presents with   New Patient (Initial Visit)    Patient in office to establish care     HPI: Patient is a 62 y.o. male seen today to establish care with [facility]. His wife is a patient of [facility] as well. He is now retired. 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 He has been married for 32 years and has no children. He smoked 1.5 pack/day of cigarettes for 18 years. He follows  up with Dr. Loretta Plume, psychiatrist, for his bipolar disorder. He complains of urine dribbling for 8 months. He feels embarrassed of having wet pants when he thought he is already done urinating but gets his pants wet with urine. He complains of having both legs hurting when he walks half mile. He was diagnosed before with PAD. He stated that he has 2/10 pain of BLE while sitting down and sometimes he has 8/10 pain at night. About 2 years ago, he said that he had his vein "Burnt and Zapped". He said that he has right kidney stone. It has been years since he had a gout flare up.     Past Medical History:  Diagnosis Date   Anxiety    Asthma    COPD (chronic obstructive pulmonary disease) ([place])    Depression    Diabetes mellitus, type II ([place])    GERD (gastroesophageal reflux disease)    Gout    HTN (hypertension)    IBS (irritable bowel syndrome)    Palpitations    Peripheral artery disease ([place])     Past Surgical History:  Procedure Laterality Date   APPENDECTOMY  1977   FUNDOPLASTY TRANSTHORACIC  2008   HERNIA REPAIR  2014   RECONSTRUCTION OF NOSE  1978    Allergies  Allergen Reactions   Bee Pollen    Milk-Related Compounds Diarrhea   Penicillins Rash    Outpatient Encounter  Medications as of 07/08/2022  Medication Sig   albuterol (PROAIR HFA) 108 (90 Base) MCG/ACT inhaler Inhale 1-2 puffs into the lungs every 6 (six) hours as needed for wheezing or shortness of breath.   amLODipine (NORVASC) 5 MG tablet Take 5 mg by mouth daily.   atenolol (TENORMIN) 12.5 mg TABS tablet Take 5 mg by mouth 2 (two) times daily.   atorvastatin (LIPITOR) 10 MG tablet TAKE 1 TABLET BY MOUTH EVERY DAY   clonazePAM (KLONOPIN) 1 MG disintegrating tablet Take 1 tablet (1 mg total) by mouth 3 (three) times daily as needed.   escitalopram (LEXAPRO) 10 MG tablet Take 3 tablets (30 mg total) by mouth daily.   Fish Oil-Cholecalciferol (FISH OIL + D3 PO) Take by mouth.   fluticasone (FLONASE) 50 MCG/ACT nasal spray Place 2 sprays into both nostrils daily.   fluticasone (FLOVENT HFA) 110 MCG/ACT inhaler Inhale 2 puffs into the lungs 2 (two) times daily.   glucose blood test strip Use to test blood sugars 2 times daily.   indomethacin (INDOCIN) 25 MG capsule Take 1 capsule (25 mg total) by mouth 3 (three) times daily as needed.   lithium carbonate 300 MG capsule Take 2 capsules (600 mg total) by mouth daily  with breakfast AND 3 capsules (900 mg total) daily after supper.   losartan (COZAAR) 50 MG tablet Take 50 mg by mouth daily.   metFORMIN (GLUCOPHAGE) 500 MG tablet Take 1 tablet (500 mg total) by mouth 2 (two) times daily with a meal.   mirtazapine (REMERON) 30 MG tablet Take 2 tablets (60 mg total) by mouth at bedtime.   omeprazole (PRILOSEC) 40 MG capsule Take 40 mg by mouth daily.   traZODone (DESYREL) 100 MG tablet Take 2 tablets (200 mg total) by mouth at bedtime.   TRUEplus Lancets 30G MISC Use to test blood sugars 2 times daily.   Acetylcysteine (N-ACETYL-L-CYSTEINE) POWD 1 Units/day by Does not apply route daily. (Patient not taking: Reported on 07/08/2022)   No facility-administered encounter medications on file as of 07/08/2022.    Review of Systems:  Review of Systems  Constitutional:   Negative for activity change, appetite change and fever.  HENT:  Negative for sore throat.   Eyes: Negative.   Cardiovascular:  Negative for chest pain and leg swelling.  Gastrointestinal:  Negative for abdominal distention, diarrhea and vomiting.  Genitourinary:  Negative for dysuria, frequency and urgency.  Skin:  Negative for color change.  Neurological:  Negative for dizziness and headaches.  Psychiatric/Behavioral:  Negative for behavioral problems and sleep disturbance. The patient is not nervous/anxious.     Health Maintenance  Topic Date Due   Hepatitis C Screening  Never done   FOOT EXAM  07/13/2018   OPHTHALMOLOGY EXAM  08/02/2018   Medicare Annual Wellness (AWV)  02/28/2020   HEMOGLOBIN A1C  05/17/2020   Diabetic kidney evaluation - Urine ACR  11/14/2020   Diabetic kidney evaluation - eGFR measurement  07/31/2021   COVID-19 Vaccine (7 - 2023-24 season) 07/24/2022 (Originally 03/31/2022)   COLONOSCOPY (Pts 45-51yr Insurance coverage will need to be confirmed)  05/04/2027   DTaP/Tdap/Td (4 - Td or Tdap) 11/14/2029   INFLUENZA VACCINE  Completed   HIV Screening  Completed   Zoster Vaccines- Shingrix  Completed   HPV VACCINES  Aged Out    Physical Exam: Vitals:   07/08/22 1245  BP: 130/82  Pulse: 84  Resp: 17  Temp: 98.2 F (36.8 C)  TempSrc: Temporal  SpO2: 96%  Weight: 175 lb 3.2 oz (79.5 kg)  Height: '6\' 1"'$  (1.854 m)   Body mass index is 23.11 kg/m. Physical Exam Constitutional:      Appearance: Normal appearance.  HENT:     Head: Normocephalic and atraumatic.     Mouth/Throat:     Mouth: Mucous membranes are moist.  Eyes:     Conjunctiva/sclera: Conjunctivae normal.  Cardiovascular:     Rate and Rhythm: Normal rate and regular rhythm.     Pulses: Normal pulses.     Heart sounds: Normal heart sounds.  Pulmonary:     Effort: Pulmonary effort is normal.     Breath sounds: Normal breath sounds.  Abdominal:     General: Bowel sounds are normal.      Palpations: Abdomen is soft.  Musculoskeletal:        General: No swelling. Normal range of motion.     Cervical back: Normal range of motion.  Skin:    General: Skin is warm and dry.  Neurological:     General: No focal deficit present.     Mental Status: He is alert and oriented to person, place, and time.  Psychiatric:        Mood and Affect: Mood normal.  Behavior: Behavior normal.        Thought Content: Thought content normal.        Judgment: Judgment normal.     Labs reviewed: Basic Metabolic Panel: No results for input(s): "NA", "K", "CL", "CO2", "GLUCOSE", "BUN", "CREATININE", "CALCIUM", "MG", "PHOS", "TSH" in the last 8760 hours. Liver Function Tests: No results for input(s): "AST", "ALT", "ALKPHOS", "BILITOT", "PROT", "ALBUMIN" in the last 8760 hours. No results for input(s): "LIPASE", "AMYLASE" in the last 8760 hours. No results for input(s): "AMMONIA" in the last 8760 hours. CBC: No results for input(s): "WBC", "NEUTROABS", "HGB", "HCT", "MCV", "PLT" in the last 8760 hours. Lipid Panel: No results for input(s): "CHOL", "HDL", "LDLCALC", "TRIG", "CHOLHDL", "LDLDIRECT" in the last 8760 hours. Lab Results  Component Value Date   HGBA1C 5.2 11/15/2019    Procedures since last visit: No results found.  Assessment/Plan  1. Encounter to establish care -  established care with [facility]  2. Type 2 diabetes mellitus with complication, without long-term current use of insulin (HCC) -  continue Metformin -  check CBG daily, log and bring to next appointment - Hemoglobin A1C  3. Essential hypertension -  continue Amlodipine, tenormin and Losartan -  check BP, log and bring to next appointment - Complete Metabolic Panel with eGFR  4. Hyperlipidemia LDL goal <100 -  continue Atorvastatin - Lipid panel  5. Bipolar 2 disorder, major depressive episode ([place]) -  continue Lithium carbonate, Klonopin and Lexapro -  follows up with psychiatrist, Dr. Loretta Plume -  Lithium level - CBC With Differential/Platelet  6. Primary insomnia -  continue Trazodone -  follows up with Dr. Loretta Plume  7. Seasonal allergic rhinitis, unspecified trigger -  continue Flonase  8. PAD (peripheral artery disease) (HCC) -  BLE pain at night and when walking 1/2 mile - Ambulatory referral to Vascular Surgery  9. Male erectile disorder -  taken Viagra PRN  10. Prostate cancer screening -  has urine dribbling - PSA, Total and Free - PSA, total and free  11. Screening for osteoporosis - DG Bone Density; Future  12. Mild persistent asthma without complication - continue PRN Albuterol  13. Chronic gout without tophus, unspecified cause, unspecified site -  takes Indomethacin PRN - Uric acid - Uric acid    Labs/tests ordered:   Uric acid  PSA, Total and Free - DG Bone Density; Future - Lithium level - CBC With Differential/Platelet - Lipid panel - Hemoglobin A1C - Complete Metabolic Panel with eGFR   Next appt:  in a month

## 2022-07-08 NOTE — Patient Instructions (Signed)

## 2022-07-09 LAB — PSA, TOTAL AND FREE
PSA, % Free: 21 % (calc) — ABNORMAL LOW (ref >25)
PSA, Free: 0.3 ng/mL
PSA, Total: 1.4 ng/mL (ref <=4.0)

## 2022-07-09 LAB — CBC WITH DIFFERENTIAL/PLATELET
Absolute Monocytes: 676 cells/uL (ref 200–950)
Basophils Absolute: 48 cells/uL (ref 0–200)
Basophils Relative: 0.7 %
Eosinophils Absolute: 110 cells/uL (ref 15–500)
Eosinophils Relative: 1.6 %
HCT: 44.9 % (ref 38.5–50.0)
Hemoglobin: 15.3 g/dL (ref 13.2–17.1)
Lymphs Abs: 1739 cells/uL (ref 850–3900)
MCH: 30.1 pg (ref 27.0–33.0)
MCHC: 34.1 g/dL (ref 32.0–36.0)
MCV: 88.4 fL (ref 80.0–100.0)
MPV: 10.7 fL (ref 7.5–12.5)
Monocytes Relative: 9.8 %
Neutro Abs: 4326 cells/uL (ref 1500–7800)
Neutrophils Relative %: 62.7 %
Platelets: 307 10*3/uL (ref 140–400)
RBC: 5.08 10*6/uL (ref 4.20–5.80)
RDW: 13.3 % (ref 11.0–15.0)
Total Lymphocyte: 25.2 %
WBC: 6.9 10*3/uL (ref 3.8–10.8)

## 2022-07-09 LAB — COMPLETE METABOLIC PANEL WITH GFR
AG Ratio: 2.3 (calc) (ref 1.0–2.5)
ALT: 24 U/L (ref 9–46)
AST: 13 U/L (ref 10–35)
Albumin: 5 g/dL (ref 3.6–5.1)
Alkaline phosphatase (APISO): 81 U/L (ref 35–144)
BUN: 17 mg/dL (ref 7–25)
CO2: 24 mmol/L (ref 20–32)
Calcium: 10 mg/dL (ref 8.6–10.3)
Chloride: 107 mmol/L (ref 98–110)
Creat: 0.95 mg/dL (ref 0.70–1.35)
Globulin: 2.2 g/dL (calc) (ref 1.9–3.7)
Glucose, Bld: 100 mg/dL — ABNORMAL HIGH (ref 65–99)
Potassium: 4.6 mmol/L (ref 3.5–5.3)
Sodium: 142 mmol/L (ref 135–146)
Total Bilirubin: 2.5 mg/dL — ABNORMAL HIGH (ref 0.2–1.2)
Total Protein: 7.2 g/dL (ref 6.1–8.1)
eGFR: 90 mL/min/{1.73_m2} (ref >=60)

## 2022-07-09 LAB — LIPID PANEL
Cholesterol: 111 mg/dL (ref <200)
HDL: 39 mg/dL — ABNORMAL LOW (ref >=40)
LDL Cholesterol (Calc): 39 mg/dL (calc)
Non-HDL Cholesterol (Calc): 72 mg/dL (calc) (ref <130)
Total CHOL/HDL Ratio: 2.8 (calc) (ref <5.0)
Triglycerides: 314 mg/dL — ABNORMAL HIGH (ref <150)

## 2022-07-09 LAB — LITHIUM LEVEL: Lithium Lvl: 0.6 mmol/L (ref 0.6–1.2)

## 2022-07-09 LAB — HEMOGLOBIN A1C
Hgb A1c MFr Bld: 5.7 % of total Hgb — ABNORMAL HIGH (ref <5.7)
Mean Plasma Glucose: 117 mg/dL
eAG (mmol/L): 6.5 mmol/L

## 2022-07-09 LAB — URIC ACID: Uric Acid, Serum: 7.3 mg/dL (ref 4.0–8.0)

## 2022-07-13 NOTE — Progress Notes (Signed)
-    PSA % free is 21, low (normal >25%), will re-check next visit -  Triglycerides  314, high (normal <150), continue Atorvastatin and can discuss on next visit, Will need to be on low-fat diet, moderate exercise at least 150 minutes/week, increase plant-based diet -  CMP, CBC, A1c, uric acid,  all within normal

## 2022-07-14 ENCOUNTER — Other Ambulatory Visit: Payer: Self-pay

## 2022-07-14 DIAGNOSIS — I739 Peripheral vascular disease, unspecified: Secondary | ICD-10-CM

## 2022-07-28 ENCOUNTER — Ambulatory Visit (INDEPENDENT_AMBULATORY_CARE_PROVIDER_SITE_OTHER): Payer: Medicare PPO

## 2022-07-28 ENCOUNTER — Encounter: Payer: Self-pay | Admitting: Vascular Surgery

## 2022-07-28 ENCOUNTER — Ambulatory Visit: Payer: Medicare PPO | Admitting: Vascular Surgery

## 2022-07-28 VITALS — BP 114/71 | HR 83 | Temp 98.5°F | Ht 73.0 in | Wt 177.0 lb

## 2022-07-28 DIAGNOSIS — M25562 Pain in left knee: Secondary | ICD-10-CM | POA: Diagnosis not present

## 2022-07-28 DIAGNOSIS — M25561 Pain in right knee: Secondary | ICD-10-CM | POA: Diagnosis not present

## 2022-07-28 DIAGNOSIS — I739 Peripheral vascular disease, unspecified: Secondary | ICD-10-CM

## 2022-07-28 LAB — VAS US ABI WITH/WO TBI
Left ABI: 1.14
Right ABI: 1.18

## 2022-07-28 NOTE — Progress Notes (Signed)
[facility] [place]  Patient name: Matthew Ortiz. MRN: [id number] DOB: 04-10-1961 Sex: male  REASON FOR CONSULT: Evaluation lower extremity discomfort, rule out arterial insufficiency  HPI: Mehar Langlinais. is a 62 y.o. male, who is here today for evaluation of lower extremity discomfort.  He has several different complaints.  He reports having more progressive leg pain over the last 3 to 4 years.  This can occur with exercise and at rest.  He reports that when he is sitting in a recliner he has significant pain throughout both legs.  This is more so in his calf and the rest of his legs.  It is not specifically in his joints.  He also reports that on occasion he will have burning in his left foot as if it is on fire.  This is worse and awakens him at night.  He has no history of tissue loss.  He does report that if he is walking particularly uphill that he will have total leg discomfort bilaterally.  He has no cardiac history.  Quit smoking in 2000.  He does report laser ablation of his bilateral saphenous veins several years ago in [place].  He did not have preprocedural swelling.  He did have some swelling following the procedures.  He does not have any large varicosities.  Past Medical History:  Diagnosis Date   Anxiety    Asthma    COPD (chronic obstructive pulmonary disease) (HCC)    Depression    Diabetes mellitus, type II (HCC)    GERD (gastroesophageal reflux disease)    Gout    HTN (hypertension)    IBS (irritable bowel syndrome)    Palpitations    Peripheral artery disease ([place])     Family History  Problem Relation Age of Onset   Depression Mother    Anxiety disorder Mother    Schizophrenia Father    Suicidality Father    Suicidality Paternal Grandfather    Drug abuse Paternal Grandfather    Alcohol abuse Paternal Grandfather     SOCIAL HISTORY: Social History   Socioeconomic History   Marital status:  Married    Spouse name: Not on file   Number of children: Not on file   Years of education: Not on file   Highest education level: Not on file  Occupational History   Not on file  Tobacco Use   Smoking status: Former    Packs/day: 1.50    Years: 20.00    Additional pack years: 0.00    Total pack years: 30.00    Types: Cigarettes    Quit date: 05/03/1998    Years since quitting: 24.2   Smokeless tobacco: Never  Vaping Use   Vaping Use: Never used  Substance and Sexual Activity   Alcohol use: Yes    Comment: socially-small amount of wine monthly - 1 glass    Drug use: No    Types: Marijuana    Comment: last use in 2018   Sexual activity: Not Currently    Partners: Female  Other Topics Concern   Not on file  Social History Narrative   Not on file   Social Determinants of Health   Financial Resource Strain: Not on file  Food Insecurity: Not on file  Transportation Needs: Not on file  Physical Activity: Not on file  Stress: Not on file  Social Connections: Not on file  Intimate Partner Violence: Not on file    Allergies  Allergen Reactions  Bee Pollen    Milk-Related Compounds Diarrhea   Penicillins Rash    Current Outpatient Medications  Medication Sig Dispense Refill   Acetylcysteine (N-ACETYL-L-CYSTEINE) POWD 1 Units/day by Does not apply route daily. 30 g 0   albuterol (PROAIR HFA) 108 (90 Base) MCG/ACT inhaler Inhale 1-2 puffs into the lungs every 6 (six) hours as needed for wheezing or shortness of breath. 1 Inhaler 3   amLODipine (NORVASC) 5 MG tablet Take 5 mg by mouth daily.     atenolol (TENORMIN) 12.5 mg TABS tablet Take 5 mg by mouth 2 (two) times daily.     atorvastatin (LIPITOR) 10 MG tablet TAKE 1 TABLET BY MOUTH EVERY DAY 90 tablet 3   BREO ELLIPTA 200-25 MCG/ACT AEPB Inhale 1 puff into the lungs daily.     clonazePAM (KLONOPIN) 1 MG disintegrating tablet Take 1 tablet (1 mg total) by mouth 3 (three) times daily as needed. 90 tablet 2    escitalopram (LEXAPRO) 10 MG tablet Take 3 tablets (30 mg total) by mouth daily. 270 tablet 0   Fish Oil-Cholecalciferol (FISH OIL + D3 PO) Take by mouth.     fluticasone (FLONASE) 50 MCG/ACT nasal spray Place 2 sprays into both nostrils daily. 16 g 3   fluticasone (FLOVENT HFA) 110 MCG/ACT inhaler Inhale 2 puffs into the lungs 2 (two) times daily. 1 Inhaler 5   glucose blood test strip Use to test blood sugars 2 times daily. 420 each 3   indomethacin (INDOCIN) 25 MG capsule Take 1 capsule (25 mg total) by mouth 3 (three) times daily as needed. 30 capsule 0   lithium carbonate 300 MG capsule Take 2 capsules (600 mg total) by mouth daily with breakfast AND 3 capsules (900 mg total) daily after supper. 450 capsule 0   losartan (COZAAR) 50 MG tablet Take 50 mg by mouth daily.     metFORMIN (GLUCOPHAGE) 500 MG tablet Take 1 tablet (500 mg total) by mouth 2 (two) times daily with a meal. 180 tablet 3   mirtazapine (REMERON) 30 MG tablet Take 2 tablets (60 mg total) by mouth at bedtime. 180 tablet 0   pantoprazole (PROTONIX) 40 MG tablet Take 40 mg by mouth daily.     traZODone (DESYREL) 100 MG tablet Take 2 tablets (200 mg total) by mouth at bedtime. 180 tablet 0   TRUEplus Lancets 30G MISC Use to test blood sugars 2 times daily. 200 each 3   No current facility-administered medications for this visit.    REVIEW OF SYSTEMS:  [X]  denotes positive finding, [ ]  denotes negative finding Cardiac  Comments:  Chest pain or chest pressure:    Shortness of breath upon exertion:    Short of breath when lying flat:    Irregular heart rhythm:        Vascular    Pain in calf, thigh, or hip brought on by ambulation: x   Pain in feet at night that wakes you up from your sleep:  x   Blood clot in your veins:    Leg swelling:         Pulmonary    Oxygen at home:    Productive cough:     Wheezing:         Neurologic    Sudden weakness in arms or legs:     Sudden numbness in arms or legs:     Sudden  onset of difficulty speaking or slurred speech:    Temporary loss of vision in one  eye:     Problems with dizziness:         Gastrointestinal    Blood in stool:     Vomited blood:         Genitourinary    Burning when urinating:     Blood in urine:        Psychiatric    Major depression:         Hematologic    Bleeding problems:    Problems with blood clotting too easily:        Skin    Rashes or ulcers:        Constitutional    Fever or chills:      PHYSICAL EXAM: Vitals:   07/28/22 1326  BP: 114/71  Pulse: 83  Temp: 98.5 F (36.9 C)  SpO2: 95%  Weight: 177 lb (80.3 kg)  Height: 6\' 1"  (1.854 m)    GENERAL: The patient is a well-nourished male, in no acute distress. The vital signs are documented above. CARDIOVASCULAR: 2+ radial pulses bilaterally.  2+ dorsalis pedis and 3+ posterior tibial pulses bilaterally PULMONARY: There is good air exchange  MUSCULOSKELETAL: There are no major deformities or cyanosis. NEUROLOGIC: No focal weakness or paresthesias are detected. SKIN: There are no ulcers or rashes noted. PSYCHIATRIC: The patient has a normal affect.  DATA:  Noninvasive studies today reveal normal ankle arm index and normal triphasic waveforms in both lower extremities.  MEDICAL ISSUES: Had long discussion with the patient.  His walking symptoms could be consistent with peripheral arterial disease.  I explained that it would be essentially impossible for any of his rest of symptoms to be related to arterial insufficiency.  He has symptoms more consistent with peripheral neuropathy.  His lower extremity arterial studies are completely normal.  I do not feel that any of his symptoms are related to peripheral artery occlusive disease.  He was reassured with this discussion and will see [place] again on an as-needed basis   Rosetta Posner, MD [place] [facility] [phone] Pager [phone]  Note: Portions of this  report may have been transcribed using voice recognition software.  Every effort has been made to ensure accuracy; however, inadvertent computerized transcription errors may still be present.

## 2022-08-09 ENCOUNTER — Encounter: Payer: Self-pay | Admitting: Adult Health

## 2022-08-09 ENCOUNTER — Ambulatory Visit (INDEPENDENT_AMBULATORY_CARE_PROVIDER_SITE_OTHER): Payer: Medicare PPO | Admitting: Adult Health

## 2022-08-09 VITALS — BP 120/80 | HR 88 | Temp 98.2°F | Resp 18 | Ht 72.0 in | Wt 177.5 lb

## 2022-08-09 DIAGNOSIS — G629 Polyneuropathy, unspecified: Secondary | ICD-10-CM | POA: Diagnosis not present

## 2022-08-09 DIAGNOSIS — E118 Type 2 diabetes mellitus with unspecified complications: Secondary | ICD-10-CM | POA: Diagnosis not present

## 2022-08-09 DIAGNOSIS — I10 Essential (primary) hypertension: Secondary | ICD-10-CM

## 2022-08-09 DIAGNOSIS — F339 Major depressive disorder, recurrent, unspecified: Secondary | ICD-10-CM | POA: Diagnosis not present

## 2022-08-09 DIAGNOSIS — F411 Generalized anxiety disorder: Secondary | ICD-10-CM

## 2022-08-09 DIAGNOSIS — F1110 Opioid abuse, uncomplicated: Secondary | ICD-10-CM | POA: Insufficient documentation

## 2022-08-09 DIAGNOSIS — E538 Deficiency of other specified B group vitamins: Secondary | ICD-10-CM | POA: Insufficient documentation

## 2022-08-09 DIAGNOSIS — F3170 Bipolar disorder, currently in remission, most recent episode unspecified: Secondary | ICD-10-CM

## 2022-08-09 DIAGNOSIS — M545 Low back pain, unspecified: Secondary | ICD-10-CM | POA: Insufficient documentation

## 2022-08-09 DIAGNOSIS — M79672 Pain in left foot: Secondary | ICD-10-CM | POA: Insufficient documentation

## 2022-08-09 DIAGNOSIS — L219 Seborrheic dermatitis, unspecified: Secondary | ICD-10-CM | POA: Insufficient documentation

## 2022-08-09 DIAGNOSIS — F17201 Nicotine dependence, unspecified, in remission: Secondary | ICD-10-CM | POA: Insufficient documentation

## 2022-08-09 DIAGNOSIS — E8881 Metabolic syndrome: Secondary | ICD-10-CM | POA: Insufficient documentation

## 2022-08-09 HISTORY — DX: Bipolar disorder, currently in remission, most recent episode unspecified: F31.70

## 2022-08-09 MED ORDER — GABAPENTIN 100 MG PO CAPS: 100.0000 mg | ORAL_CAPSULE | Freq: Two times a day (BID) | ORAL | 3 refills | Status: DC

## 2022-08-09 NOTE — Patient Instructions (Signed)
Peripheral Neuropathy Peripheral neuropathy is a type of nerve damage. It affects nerves that carry signals between the spinal cord and the arms, legs, and the rest of the body (peripheral nerves). It does not affect nerves in the spinal cord or brain. In peripheral neuropathy, one nerve or a group of nerves may be damaged. Peripheral neuropathy is a broad category that includes many specific nerve disorders, like diabetic neuropathy, hereditary neuropathy, and carpal tunnel syndrome. What are the causes? This condition may be caused by: Certain diseases, such as: Diabetes. This is the most common cause of peripheral neuropathy. Autoimmune diseases, such as rheumatoid arthritis and systemic lupus erythematosus. Nerve diseases that are passed from parent to child (inherited). Kidney disease. Thyroid disease. Other causes may include: Nerve injury. Pressure or stress on a nerve that lasts a long time. Lack (deficiency) of B vitamins. This can result from alcoholism, poor diet, or a restricted diet. Infections. Some medicines, such as cancer medicines (chemotherapy). Poisonous (toxic) substances, such as lead and mercury. Too little blood flowing to the legs. In some cases, the cause of this condition is not known. What are the signs or symptoms? Symptoms of this condition depend on which of your nerves is damaged. Symptoms in the legs, hands, and arms can include: Loss of feeling (numbness) in the feet, hands, or both. Tingling in the feet, hands, or both. Burning pain. Very sensitive skin. Weakness. Not being able to move a part of the body (paralysis). Clumsiness or poor coordination. Muscle twitching. Loss of balance. Symptoms in other parts of the body can include: Not being able to control your bladder. Feeling dizzy. Sexual problems. How is this diagnosed? Diagnosing and finding the cause of peripheral neuropathy can be difficult. Your health care provider will take your  medical history and do a physical exam. A neurological exam will also be done. This involves checking things that are affected by your brain, spinal cord, and nerves (nervous system). For example, your health care provider will check your reflexes, how you move, and what you can feel. You may have other tests, such as: Blood tests. Electromyogram (EMG) and nerve conduction tests. These tests check nerve function and how well the nerves are controlling the muscles. Imaging tests, such as a CT scan or MRI, to rule out other causes of your symptoms. Removing a small piece of nerve to be examined in a lab (nerve biopsy). Removing and examining a small amount of the fluid that surrounds the brain and spinal cord (lumbar puncture). How is this treated? Treatment for this condition may involve: Treating the underlying cause of the neuropathy, such as diabetes, kidney disease, or vitamin deficiencies. Stopping medicines that can cause neuropathy, such as chemotherapy. Medicine to help relieve pain. Medicines may include: Prescription or over-the-counter pain medicine. Anti-seizure medicine. Antidepressants. Pain-relieving patches that are applied to painful areas of skin. Surgery to relieve pressure on a nerve or to destroy a nerve that is causing pain. Physical therapy to help improve movement and balance. Devices to help you move around (assistive devices). Follow these instructions at home: Medicines Take over-the-counter and prescription medicines only as told by your health care provider. Do not take any other medicines without first asking your health care provider. Ask your health care provider if the medicine prescribed to you requires you to avoid driving or using machinery. Lifestyle  Do not use any products that contain nicotine or tobacco. These products include cigarettes, chewing tobacco, and vaping devices, such as e-cigarettes. Smoking keeps   blood from reaching damaged nerves. If you  need help quitting, ask your health care provider. Avoid or limit alcohol. Too much alcohol can cause a vitamin B deficiency, and vitamin B is needed for healthy nerves. Eat a healthy diet. This includes: Eating foods that are high in fiber, such as beans, whole grains, and fresh fruits and vegetables. Limiting foods that are high in fat and processed sugars, such as fried or sweet foods. General instructions  If you have diabetes, work closely with your health care provider to keep your blood sugar under control. If you have numbness in your feet: Check every day for signs of injury or infection. Watch for redness, warmth, and swelling. Wear padded socks and comfortable shoes. These help protect your feet. Develop a good support system. Living with peripheral neuropathy can be stressful. Consider talking with a mental health specialist or joining a support group. Use assistive devices and attend physical therapy as told by your health care provider. This may include using a walker or a cane. Keep all follow-up visits. This is important. Where to find more information National Institute of Neurological Disorders: www.ninds.nih.gov Contact a health care provider if: You have new signs or symptoms of peripheral neuropathy. You are struggling emotionally from dealing with peripheral neuropathy. Your pain is not well controlled. Get help right away if: You have an injury or infection that is not healing normally. You develop new weakness in an arm or leg. You have fallen or do so frequently. Summary Peripheral neuropathy is when the nerves in the arms or legs are damaged, resulting in numbness, weakness, or pain. There are many causes of peripheral neuropathy, including diabetes, pinched nerves, vitamin deficiencies, autoimmune disease, and hereditary conditions. Diagnosing and finding the cause of peripheral neuropathy can be difficult. Your health care provider will take your medical  history, do a physical exam, and do tests, including blood tests and nerve function tests. Treatment involves treating the underlying cause of the neuropathy and taking medicines to help control pain. Physical therapy and assistive devices may also help. This information is not intended to replace advice given to you by your health care provider. Make sure you discuss any questions you have with your health care provider. Document Revised: 12/23/2020 Document Reviewed: 12/23/2020 Elsevier Patient Education  2023 [employer]  
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
## 2022-08-12 ENCOUNTER — Telehealth (HOSPITAL_BASED_OUTPATIENT_CLINIC_OR_DEPARTMENT_OTHER): Payer: Medicare PPO | Admitting: Psychiatry

## 2022-08-12 DIAGNOSIS — F5105 Insomnia due to other mental disorder: Secondary | ICD-10-CM

## 2022-08-12 DIAGNOSIS — F424 Excoriation (skin-picking) disorder: Secondary | ICD-10-CM

## 2022-08-12 DIAGNOSIS — F411 Generalized anxiety disorder: Secondary | ICD-10-CM

## 2022-08-12 DIAGNOSIS — F3181 Bipolar II disorder: Secondary | ICD-10-CM

## 2022-08-12 DIAGNOSIS — F4310 Post-traumatic stress disorder, unspecified: Secondary | ICD-10-CM

## 2022-08-12 MED ORDER — LITHIUM CARBONATE 300 MG PO CAPS: ORAL_CAPSULE | ORAL | 0 refills | Status: DC

## 2022-08-12 MED ORDER — CLONAZEPAM 1 MG PO TBDP: 1.0000 mg | ORAL_TABLET | Freq: Three times a day (TID) | ORAL | 2 refills | Status: DC | PRN

## 2022-08-12 MED ORDER — TRAZODONE HCL 300 MG PO TABS: 300.0000 mg | ORAL_TABLET | Freq: Every day | ORAL | 0 refills | Status: DC

## 2022-08-12 MED ORDER — MIRTAZAPINE 30 MG PO TABS: 60.0000 mg | ORAL_TABLET | Freq: Every day | ORAL | 0 refills | Status: DC

## 2022-08-12 MED ORDER — ESCITALOPRAM OXALATE 10 MG PO TABS: 30.0000 mg | ORAL_TABLET | Freq: Every day | ORAL | 0 refills | Status: DC

## 2022-08-12 MED ORDER — N-ACETYL-L-CYSTEINE POWD: 1.0000 [IU]/d | Freq: Every day | 0 refills | Status: DC
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
## 2022-09-06 ENCOUNTER — Encounter (HOSPITAL_COMMUNITY): Payer: Self-pay

## 2022-10-07 ENCOUNTER — Telehealth (HOSPITAL_BASED_OUTPATIENT_CLINIC_OR_DEPARTMENT_OTHER): Payer: Medicare PPO | Admitting: Psychiatry

## 2022-10-07 DIAGNOSIS — F5105 Insomnia due to other mental disorder: Secondary | ICD-10-CM

## 2022-10-07 DIAGNOSIS — F4310 Post-traumatic stress disorder, unspecified: Secondary | ICD-10-CM

## 2022-10-07 DIAGNOSIS — F411 Generalized anxiety disorder: Secondary | ICD-10-CM | POA: Diagnosis not present

## 2022-10-07 DIAGNOSIS — F424 Excoriation (skin-picking) disorder: Secondary | ICD-10-CM

## 2022-10-07 DIAGNOSIS — F3181 Bipolar II disorder: Secondary | ICD-10-CM | POA: Diagnosis not present

## 2022-10-07 MED ORDER — LITHIUM CARBONATE 300 MG PO CAPS
ORAL_CAPSULE | ORAL | 0 refills | Status: DC
Start: 2022-10-07 — End: 2023-01-11

## 2022-10-07 MED ORDER — ESCITALOPRAM OXALATE 10 MG PO TABS: 30.0000 mg | ORAL_TABLET | Freq: Every day | ORAL | 0 refills | Status: DC

## 2022-10-07 MED ORDER — CLONAZEPAM 1 MG PO TBDP: 1.0000 mg | ORAL_TABLET | Freq: Three times a day (TID) | ORAL | 2 refills | Status: DC | PRN

## 2022-10-07 MED ORDER — MIRTAZAPINE 30 MG PO TABS
60.0000 mg | ORAL_TABLET | Freq: Every day | ORAL | 0 refills | Status: DC
Start: 2022-10-07 — End: 2023-01-11

## 2022-10-07 MED ORDER — TRAZODONE HCL 300 MG PO TABS
300.0000 mg | ORAL_TABLET | Freq: Every day | ORAL | 0 refills | Status: DC
Start: 2022-10-07 — End: 2023-01-11
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
## 2022-10-11 ENCOUNTER — Telehealth (HOSPITAL_COMMUNITY): Payer: Self-pay | Admitting: Psychiatry

## 2022-10-20 ENCOUNTER — Encounter: Payer: Self-pay | Admitting: Adult Health

## 2022-10-20 ENCOUNTER — Ambulatory Visit (INDEPENDENT_AMBULATORY_CARE_PROVIDER_SITE_OTHER): Payer: Medicare PPO | Admitting: Adult Health

## 2022-10-20 VITALS — BP 121/88 | HR 96 | Temp 97.8°F | Resp 18 | Ht 72.0 in | Wt 172.4 lb

## 2022-10-20 DIAGNOSIS — I10 Essential (primary) hypertension: Secondary | ICD-10-CM

## 2022-10-20 DIAGNOSIS — E118 Type 2 diabetes mellitus with unspecified complications: Secondary | ICD-10-CM

## 2022-10-20 DIAGNOSIS — E785 Hyperlipidemia, unspecified: Secondary | ICD-10-CM

## 2022-10-20 DIAGNOSIS — F3181 Bipolar II disorder: Secondary | ICD-10-CM | POA: Diagnosis not present

## 2022-10-20 DIAGNOSIS — J449 Chronic obstructive pulmonary disease, unspecified: Secondary | ICD-10-CM

## 2022-10-20 DIAGNOSIS — R079 Chest pain, unspecified: Secondary | ICD-10-CM

## 2022-10-20 DIAGNOSIS — G629 Polyneuropathy, unspecified: Secondary | ICD-10-CM

## 2022-10-20 LAB — CBC WITH DIFFERENTIAL/PLATELET
Absolute Monocytes: 541 cells/uL (ref 200–950)
Basophils Absolute: 51 cells/uL (ref 0–200)
Basophils Relative: 1 %
Eosinophils Absolute: 122 cells/uL (ref 15–500)
Eosinophils Relative: 2.4 %
HCT: 44.7 % (ref 38.5–50.0)
Hemoglobin: 15.1 g/dL (ref 13.2–17.1)
Lymphs Abs: 1352 cells/uL (ref 850–3900)
MCH: 29.4 pg (ref 27.0–33.0)
MCHC: 33.8 g/dL (ref 32.0–36.0)
MCV: 87.1 fL (ref 80.0–100.0)
MPV: 10.7 fL (ref 7.5–12.5)
Monocytes Relative: 10.6 %
Neutro Abs: 3035 cells/uL (ref 1500–7800)
Neutrophils Relative %: 59.5 %
Platelets: 272 10*3/uL (ref 140–400)
RBC: 5.13 10*6/uL (ref 4.20–5.80)
RDW: 12.8 % (ref 11.0–15.0)
Total Lymphocyte: 26.5 %
WBC: 5.1 10*3/uL (ref 3.8–10.8)

## 2022-10-20 LAB — BASIC METABOLIC PANEL WITH GFR
BUN: 14 mg/dL (ref 7–25)
CO2: 27 mmol/L (ref 20–32)
Calcium: 9.8 mg/dL (ref 8.6–10.3)
Chloride: 106 mmol/L (ref 98–110)
Creat: 0.79 mg/dL (ref 0.70–1.35)
Glucose, Bld: 102 mg/dL — ABNORMAL HIGH (ref 65–99)
Potassium: 4.6 mmol/L (ref 3.5–5.3)
Sodium: 140 mmol/L (ref 135–146)
eGFR: 100 mL/min/{1.73_m2} (ref >=60)

## 2022-10-20 MED ORDER — ATORVASTATIN CALCIUM 20 MG PO TABS
20.0000 mg | ORAL_TABLET | Freq: Every day | ORAL | 3 refills | Status: DC
Start: 2022-10-20 — End: 2024-02-09

## 2022-10-20 NOTE — Progress Notes (Signed)
[facility] clinic  Provider: Kenard Gower DNP  Code Status:  Full Code  Goals of Care:     10/20/2022    8:29 AM  Advanced Directives  Does Patient Have a Medical Advance Directive? No  Would patient like information on creating a medical advance directive? No - Patient declined     Chief Complaint  Patient presents with   Acute Visit    May need referral to cardiology    HPI: Patient is a 62 y.o. male seen today for an acute visit for requesting referral to cardiology. He stated that he  went to ED due to chest pain on 10/18/22. He said that he lifted boxes weighing 30 - 50 lbs for 3 hours then had chest pain. He went to ED and was given 4 baby ASA and 3 bags of IV fluids. He is now requesting referral to cardiology. He said that 2 weks prior to ED visit, he got dizzy and didn't tell anybody. PHQ-9 score today was 15, ranging in moderate depression. He follows up with Dr. Laurelyn Sickle, psychiatrist.    BP 121/88, takes Amlodipine, Atenolol and Losartan for hypertension.  He takes Gabapentin for neuropathy. He has burning, tingling and numbness on his feet but "can put up with it".  Past Medical History:  Diagnosis Date   Anxiety    Asthma    COPD (chronic obstructive pulmonary disease) (HCC)    Depression    Diabetes mellitus, type II (HCC)    GERD (gastroesophageal reflux disease)    Gout    HTN (hypertension)    IBS (irritable bowel syndrome)    Palpitations    Peripheral artery disease (HCC)     Past Surgical History:  Procedure Laterality Date   APPENDECTOMY  1977   FUNDOPLASTY TRANSTHORACIC  2008   HERNIA REPAIR  2014   RECONSTRUCTION OF NOSE  1978    Allergies  Allergen Reactions   Bee Pollen    Milk-Related Compounds Diarrhea   Penicillins Rash    Outpatient Encounter Medications as of 10/20/2022  Medication Sig   Acetylcysteine (N-ACETYL-L-CYSTEINE) POWD 1 Units/day by Does not apply route daily.   albuterol (PROAIR HFA) 108 (90 Base) MCG/ACT  inhaler Inhale 1-2 puffs into the lungs every 6 (six) hours as needed for wheezing or shortness of breath.   amLODipine (NORVASC) 5 MG tablet Take 5 mg by mouth daily.   atenolol (TENORMIN) 12.5 mg TABS tablet Take 5 mg by mouth 2 (two) times daily.   atorvastatin (LIPITOR) 10 MG tablet TAKE 1 TABLET BY MOUTH EVERY DAY   BREO ELLIPTA 200-25 MCG/ACT AEPB Inhale 1 puff into the lungs daily.   clonazePAM (KLONOPIN) 1 MG disintegrating tablet Take 1 tablet (1 mg total) by mouth 3 (three) times daily as needed.   escitalopram (LEXAPRO) 10 MG tablet Take 3 tablets (30 mg total) by mouth daily.   Fish Oil-Cholecalciferol (FISH OIL + D3 PO) Take by mouth.   fluticasone (FLONASE) 50 MCG/ACT nasal spray Place 2 sprays into both nostrils daily.   fluticasone (FLOVENT HFA) 110 MCG/ACT inhaler Inhale 2 puffs into the lungs 2 (two) times daily.   gabapentin (NEURONTIN) 100 MG capsule Take 1 capsule (100 mg total) by mouth 2 (two) times daily.   glucose blood test strip Use to test blood sugars 2 times daily.   indomethacin (INDOCIN) 25 MG capsule Take 1 capsule (25 mg total) by mouth 3 (three) times daily as needed.   lithium carbonate 300 MG capsule Take  2 capsules (600 mg total) by mouth daily with breakfast AND 3 capsules (900 mg total) daily after supper.   losartan (COZAAR) 50 MG tablet Take 50 mg by mouth daily.   metFORMIN (GLUCOPHAGE) 500 MG tablet Take 1 tablet (500 mg total) by mouth 2 (two) times daily with a meal.   mirtazapine (REMERON) 30 MG tablet Take 2 tablets (60 mg total) by mouth at bedtime.   pantoprazole (PROTONIX) 40 MG tablet Take 40 mg by mouth daily.   trazodone (DESYREL) 300 MG tablet Take 1 tablet (300 mg total) by mouth at bedtime.   TRUEplus Lancets 30G MISC Use to test blood sugars 2 times daily.   No facility-administered encounter medications on file as of 10/20/2022.    Review of Systems:  Review of Systems  Constitutional:  Negative for activity change, appetite change  and fever.  HENT:  Negative for sore throat.   Eyes: Negative.   Cardiovascular:  Positive for chest pain. Negative for leg swelling.  Gastrointestinal:  Negative for abdominal distention, diarrhea and vomiting.  Genitourinary:  Negative for dysuria, frequency and urgency.  Skin:  Negative for color change.  Neurological:  Negative for dizziness and headaches.  Psychiatric/Behavioral:  Negative for behavioral problems and sleep disturbance. The patient is not nervous/anxious.     Health Maintenance  Topic Date Due   Hepatitis C Screening  Never done   FOOT EXAM  07/13/2018   OPHTHALMOLOGY EXAM  08/02/2018   Medicare Annual Wellness (AWV)  02/28/2020   Diabetic kidney evaluation - Urine ACR  11/14/2020   COVID-19 Vaccine (7 - 2023-24 season) 03/31/2022   INFLUENZA VACCINE  12/02/2022   HEMOGLOBIN A1C  01/08/2023   Diabetic kidney evaluation - eGFR measurement  07/08/2023   Colonoscopy  05/04/2027   DTaP/Tdap/Td (4 - Td or Tdap) 11/14/2029   HIV Screening  Completed   Zoster Vaccines- Shingrix  Completed   HPV VACCINES  Aged Out    Physical Exam: Vitals:   10/20/22 0949  BP: 121/88  Pulse: 96  Resp: 18  Temp: 97.8 F (36.6 C)  SpO2: 96%  Weight: 172 lb 6.4 oz (78.2 kg)  Height: 6' (1.829 m)   Body mass index is 23.38 kg/m. Physical Exam Constitutional:      Appearance: Normal appearance.  HENT:     Head: Normocephalic and atraumatic.     Mouth/Throat:     Mouth: Mucous membranes are moist.  Eyes:     Conjunctiva/sclera: Conjunctivae normal.  Cardiovascular:     Rate and Rhythm: Normal rate and regular rhythm.     Pulses: Normal pulses.     Heart sounds: Normal heart sounds.  Pulmonary:     Effort: Pulmonary effort is normal.     Breath sounds: Normal breath sounds.  Abdominal:     General: Bowel sounds are normal.     Palpations: Abdomen is soft.  Musculoskeletal:        General: No swelling. Normal range of motion.     Cervical back: Normal range of  motion.  Skin:    General: Skin is warm and dry.  Neurological:     General: No focal deficit present.     Mental Status: He is alert and oriented to person, place, and time.  Psychiatric:        Mood and Affect: Mood normal.        Behavior: Behavior normal.        Thought Content: Thought content normal.  Judgment: Judgment normal.     Labs reviewed: Basic Metabolic Panel: Recent Labs    07/08/22 1329  NA 142  K 4.6  CL 107  CO2 24  GLUCOSE 100*  BUN 17  CREATININE 0.95  CALCIUM 10.0   Liver Function Tests: Recent Labs    07/08/22 1329  AST 13  ALT 24  BILITOT 2.5*  PROT 7.2   No results for input(s): "LIPASE", "AMYLASE" in the last 8760 hours. No results for input(s): "AMMONIA" in the last 8760 hours. CBC: Recent Labs    07/08/22 1329  WBC 6.9  NEUTROABS 4,326  HGB 15.3  HCT 44.9  MCV 88.4  PLT 307   Lipid Panel: Recent Labs    07/08/22 1329  CHOL 111  HDL 39*  LDLCALC 39  TRIG 086*  CHOLHDL 2.8   Lab Results  Component Value Date   HGBA1C 5.7 (H) 07/08/2022    Procedures since last visit: No results found.  Assessment/Plan  1. Chest pain, unspecified type -  denies chest pain today - Basic Metabolic Panel with eGFR - CBC With Differential/Platelet - Ambulatory referral to Cardiology  2. Chronic obstructive pulmonary disease, unspecified COPD type (HCC) -  no shortness of breath/wheezing -  continue Breo-Ellipta  3. Bipolar 2 disorder, major depressive episode (HCC) -  PHQ-9 score 15, ranging moderate depression -  continue Klonopin, Lithium  4. Essential hypertension -  stable -  continue Losartan, Atenolol  5. Type 2 diabetes mellitus with complication, without long-term current use of insulin (HCC) Lab Results  Component Value Date   HGBA1C 5.7 (H) 07/08/2022  -  continue Metformin  6. Neuropathy -  stable -  continue Neurontin  7. Hyperlipidemia LDL goal <100 Lab Results  Component Value Date   CHOL 111  07/08/2022   HDL 39 (L) 07/08/2022   LDLCALC 39 07/08/2022   TRIG 314 (H) 07/08/2022   CHOLHDL 2.8 07/08/2022    - atorvastatin (LIPITOR) 20 MG tablet; Take 1 tablet (20 mg total) by mouth daily.  Dispense: 90 tablet; Refill: 3     Labs/tests ordered:  BMP and CBC  Next appt:  11/08/2022

## 2022-10-20 NOTE — Progress Notes (Signed)
CBC normal, no anemia -  electrolytes and kidney function all normal

## 2022-10-26 NOTE — Progress Notes (Incomplete)
[facility] clinic  Provider: Kenard Gower DNP  Code Status:  Full Code  Goals of Care:     10/20/2022    8:29 AM  Advanced Directives  Does Patient Have a Medical Advance Directive? No  Would patient like information on creating a medical advance directive? No - Patient declined     Chief Complaint  Patient presents with  . Acute Visit    May need referral to cardiology    HPI: Patient is a 62 y.o. male seen today for an acute visit for requesting referral to cardiology.   Past Medical History:  Diagnosis Date  . Anxiety   . Asthma   . COPD (chronic obstructive pulmonary disease) (HCC)   . Depression   . Diabetes mellitus, type II (HCC)   . GERD (gastroesophageal reflux disease)   . Gout   . HTN (hypertension)   . IBS (irritable bowel syndrome)   . Palpitations   . Peripheral artery disease [facility])     Past Surgical History:  Procedure Laterality Date  . APPENDECTOMY  1977  . FUNDOPLASTY TRANSTHORACIC  2008  . HERNIA REPAIR  2014  . RECONSTRUCTION OF NOSE  1978    Allergies  Allergen Reactions  . Bee Pollen   . Milk-Related Compounds Diarrhea  . Penicillins Rash    Outpatient Encounter Medications as of 10/20/2022  Medication Sig  . Acetylcysteine (N-ACETYL-L-CYSTEINE) POWD 1 Units/day by Does not apply route daily.  Marland Kitchen albuterol (PROAIR HFA) 108 (90 Base) MCG/ACT inhaler Inhale 1-2 puffs into the lungs every 6 (six) hours as needed for wheezing or shortness of breath.  Marland Kitchen amLODipine (NORVASC) 5 MG tablet Take 5 mg by mouth daily.  Marland Kitchen atenolol (TENORMIN) 12.5 mg TABS tablet Take 5 mg by mouth 2 (two) times daily.  Marland Kitchen atorvastatin (LIPITOR) 10 MG tablet TAKE 1 TABLET BY MOUTH EVERY DAY  . BREO ELLIPTA 200-25 MCG/ACT AEPB Inhale 1 puff into the lungs daily.  . clonazePAM (KLONOPIN) 1 MG disintegrating tablet Take 1 tablet (1 mg total) by mouth 3 (three) times daily as needed.  Marland Kitchen escitalopram (LEXAPRO) 10 MG tablet Take 3 tablets (30 mg total) by mouth daily.   . Fish Oil-Cholecalciferol (FISH OIL + D3 PO) Take by mouth.  . fluticasone (FLONASE) 50 MCG/ACT nasal spray Place 2 sprays into both nostrils daily.  . fluticasone (FLOVENT HFA) 110 MCG/ACT inhaler Inhale 2 puffs into the lungs 2 (two) times daily.  Marland Kitchen gabapentin (NEURONTIN) 100 MG capsule Take 1 capsule (100 mg total) by mouth 2 (two) times daily.  Marland Kitchen glucose blood test strip Use to test blood sugars 2 times daily.  . indomethacin (INDOCIN) 25 MG capsule Take 1 capsule (25 mg total) by mouth 3 (three) times daily as needed.  . lithium carbonate 300 MG capsule Take 2 capsules (600 mg total) by mouth daily with breakfast AND 3 capsules (900 mg total) daily after supper.  . losartan (COZAAR) 50 MG tablet Take 50 mg by mouth daily.  . metFORMIN (GLUCOPHAGE) 500 MG tablet Take 1 tablet (500 mg total) by mouth 2 (two) times daily with a meal.  . mirtazapine (REMERON) 30 MG tablet Take 2 tablets (60 mg total) by mouth at bedtime.  . pantoprazole (PROTONIX) 40 MG tablet Take 40 mg by mouth daily.  . trazodone (DESYREL) 300 MG tablet Take 1 tablet (300 mg total) by mouth at bedtime.  . TRUEplus Lancets 30G MISC Use to test blood sugars 2 times daily.   No facility-administered  encounter medications on file as of 10/20/2022.    Review of Systems:  Review of Systems  Constitutional:  Negative for activity change, appetite change and fever.  HENT:  Negative for sore throat.   Eyes: Negative.   Cardiovascular:  Negative for chest pain and leg swelling.  Gastrointestinal:  Negative for abdominal distention, diarrhea and vomiting.  Genitourinary:  Negative for dysuria, frequency and urgency.  Skin:  Negative for color change.  Neurological:  Negative for dizziness and headaches.  Psychiatric/Behavioral:  Negative for behavioral problems and sleep disturbance. The patient is not nervous/anxious.     Health Maintenance  Topic Date Due  . Hepatitis C Screening  Never done  . FOOT EXAM  07/13/2018  .  OPHTHALMOLOGY EXAM  08/02/2018  . Medicare Annual Wellness (AWV)  02/28/2020  . Diabetic kidney evaluation - Urine ACR  11/14/2020  . COVID-19 Vaccine (7 - 2023-24 season) 03/31/2022  . INFLUENZA VACCINE  12/02/2022  . HEMOGLOBIN A1C  01/08/2023  . Diabetic kidney evaluation - eGFR measurement  07/08/2023  . Colonoscopy  05/04/2027  . DTaP/Tdap/Td (4 - Td or Tdap) 11/14/2029  . HIV Screening  Completed  . Zoster Vaccines- Shingrix  Completed  . HPV VACCINES  Aged Out    Physical Exam: Vitals:   10/20/22 0949  BP: 121/88  Pulse: 96  Resp: 18  Temp: 97.8 F (36.6 C)  SpO2: 96%  Weight: 172 lb 6.4 oz (78.2 kg)  Height: 6' (1.829 m)   Body mass index is 23.38 kg/m. Physical Exam Constitutional:      Appearance: Normal appearance.  HENT:     Head: Normocephalic and atraumatic.     Mouth/Throat:     Mouth: Mucous membranes are moist.  Eyes:     Conjunctiva/sclera: Conjunctivae normal.  Cardiovascular:     Rate and Rhythm: Normal rate and regular rhythm.     Pulses: Normal pulses.     Heart sounds: Normal heart sounds.  Pulmonary:     Effort: Pulmonary effort is normal.     Breath sounds: Normal breath sounds.  Abdominal:     General: Bowel sounds are normal.     Palpations: Abdomen is soft.  Musculoskeletal:        General: No swelling. Normal range of motion.     Cervical back: Normal range of motion.  Skin:    General: Skin is warm and dry.  Neurological:     General: No focal deficit present.     Mental Status: He is alert and oriented to person, place, and time.  Psychiatric:        Mood and Affect: Mood normal.        Behavior: Behavior normal.        Thought Content: Thought content normal.        Judgment: Judgment normal.     Labs reviewed: Basic Metabolic Panel: Recent Labs    07/08/22 1329  NA 142  K 4.6  CL 107  CO2 24  GLUCOSE 100*  BUN 17  CREATININE 0.95  CALCIUM 10.0   Liver Function Tests: Recent Labs    07/08/22 1329  AST 13   ALT 24  BILITOT 2.5*  PROT 7.2   No results for input(s): "LIPASE", "AMYLASE" in the last 8760 hours. No results for input(s): "AMMONIA" in the last 8760 hours. CBC: Recent Labs    07/08/22 1329  WBC 6.9  NEUTROABS 4,326  HGB 15.3  HCT 44.9  MCV 88.4  PLT 307  Lipid Panel: Recent Labs    07/08/22 1329  CHOL 111  HDL 39*  LDLCALC 39  TRIG 161*  CHOLHDL 2.8   Lab Results  Component Value Date   HGBA1C 5.7 (H) 07/08/2022    Procedures since last visit: No results found.  Assessment/Plan      Labs/tests ordered:    Next appt:  11/08/2022

## 2022-11-08 ENCOUNTER — Ambulatory Visit: Payer: Medicare PPO | Admitting: Adult Health

## 2022-11-11 ENCOUNTER — Other Ambulatory Visit (HOSPITAL_COMMUNITY): Payer: Self-pay | Admitting: Psychiatry

## 2022-11-11 DIAGNOSIS — F5105 Insomnia due to other mental disorder: Secondary | ICD-10-CM

## 2022-11-17 ENCOUNTER — Ambulatory Visit: Payer: Medicare PPO | Attending: Internal Medicine | Admitting: Internal Medicine

## 2022-11-17 ENCOUNTER — Encounter: Payer: Self-pay | Admitting: Internal Medicine

## 2022-11-17 VITALS — BP 114/80 | HR 73 | Ht 73.0 in | Wt 172.0 lb

## 2022-11-17 DIAGNOSIS — R079 Chest pain, unspecified: Secondary | ICD-10-CM | POA: Diagnosis not present

## 2022-11-17 MED ORDER — LOSARTAN POTASSIUM 25 MG PO TABS: 25.0000 mg | ORAL_TABLET | Freq: Every day | ORAL | 3 refills | Status: DC

## 2022-11-17 MED ORDER — METOPROLOL TARTRATE 100 MG PO TABS: ORAL_TABLET | ORAL | 0 refills | Status: DC

## 2022-11-17 NOTE — Patient Instructions (Addendum)
Medication Instructions:  Your physician has recommended you make the following change in your medication:   -Stop Atenolol -Decrease Losartan to 25 mg tablet once daily  *If you need a refill on your cardiac medications before your next appointment, please call your pharmacy*   Lab Work: BMET If you have labs (blood work) drawn today and your tests are completely normal, you will receive your results only by: MyChart Message (if you have MyChart) OR A paper copy in the mail If you have any lab test that is abnormal or we need to change your treatment, we will call you to review the results.   Testing/Procedures: Coronary CTA  Your physician has requested that you have an echocardiogram. Echocardiography is a painless test that uses sound waves to create images of your heart. It provides your doctor with information about the size and shape of your heart and how well your heart's chambers and valves are working. This procedure takes approximately one hour. There are no restrictions for this procedure. Please do NOT wear cologne, perfume, aftershave, or lotions (deodorant is allowed). Please arrive 15 minutes prior to your appointment time.    Follow-Up: At [facility], you and your health needs are our priority.  As part of our continuing mission to provide you with exceptional heart care, we have created designated Provider Care Teams.  These Care Teams include your primary Cardiologist (physician) and Advanced Practice Providers (APPs -  Physician Assistants and Nurse Practitioners) who all work together to provide you with the care you need, when you need it.  We recommend signing up for the patient portal called "MyChart".  Sign up information is provided on this After Visit Summary.  MyChart is used to connect with patients for Virtual Visits (Telemedicine).  Patients are able to view lab/test results, encounter notes, upcoming appointments, etc.  Non-urgent messages can  be sent to your provider as well.   To learn more about what you can do with MyChart, go to [email or web address].    Your next appointment:    Pending Testing Results     Your cardiac CT will be scheduled at one of the below locations:   [facility] [address] [place], [place] [address] [phone]  If scheduled at [facility], please arrive at the [facility] and [facility] Entrance (Entrance C2) of [facility] 30 minutes prior to test start time. You can use the FREE valet parking offered at entrance C (encouraged to control the heart rate for the test)  Proceed to the [facility] Radiology Department (first floor) to check-in and test prep.  All radiology patients and guests should use entrance C2 at [facility], accessed from [facility], even though the hospital's physical address listed is [address]    If scheduled at [facility] or [facility], please arrive 15 mins early for check-in and test prep.  There is spacious parking and easy access to the radiology department from the [facility] entrance. Please enter here and check-in with the desk attendant.   Please follow these instructions carefully (unless otherwise directed):  An IV will be required for this test and Nitroglycerin will be given.  Hold all erectile dysfunction medications at least 3 days (72 hrs) prior to test. (Ie viagra, cialis, sildenafil, tadalafil, etc)   On the Night Before the Test: Be sure to Drink plenty of water. Do not consume any caffeinated/decaffeinated beverages or  chocolate 12 hours prior to your test. Do not take any antihistamines 12 hours prior to your test.  On the Day of the Test: Drink plenty of water until 1 hour prior to the test. Do not eat any food 1 hour prior to test. You may take your regular medications prior to the test.  Take metoprolol  (Lopressor) two hours prior to test. If you take Furosemide/Hydrochlorothiazide/Spironolactone, please HOLD on the morning of the test. FEMALES- please wear underwire-free bra if available, avoid dresses & tight clothing      After the Test: Drink plenty of water. After receiving IV contrast, you may experience a mild flushed feeling. This is normal. On occasion, you may experience a mild rash up to 24 hours after the test. This is not dangerous. If this occurs, you can take Benadryl 25 mg and increase your fluid intake. If you experience trouble breathing, this can be serious. If it is severe call 911 IMMEDIATELY. If it is mild, please call our office. If you take any of these medications: Glipizide/Metformin, Avandament, Glucavance, please do not take 48 hours after completing test unless otherwise instructed.  We will call to schedule your test 2-4 weeks out understanding that some insurance companies will need an authorization prior to the service being performed.   For more information and frequently asked questions, please visit our website : [email or web address]  For non-scheduling related questions, please contact the cardiac imaging nurse navigator should you have any questions/concerns: Cardiac Imaging Nurse Navigators Direct Office Dial: [phone]   For scheduling needs, including cancellations and rescheduling, please call [place], [phone].

## 2022-11-19 DIAGNOSIS — R079 Chest pain, unspecified: Secondary | ICD-10-CM | POA: Insufficient documentation

## 2022-11-19 NOTE — Progress Notes (Signed)
Cardiology Office Note  Date: 11/19/2022   ID: Matthew Merl., DOB 06-Aug-1960, MRN [id number]  PCP:  Gillis Santa, NP  Cardiologist:  Marjo Bicker, MD Electrophysiologist:  None   Reason for Office Visit: Chest pain evaluation   History of Present Illness: Matthew Baranek. is a 62 y.o. male known to have HTN, DM2, COPD was referred to cardiology clinic for chest pain evaluation.  Patient reported having chest pain around one month ago prompting ER visit at [facility]. Since then, he has been having fleeting chest pains lasting for 15 to 30 seconds. Occur mostly with exertion and sometimes with rest. Never had these chest pains prior to one month. Denies DOE, dizziness, syncope, palpitations. No prior MI/PCI/CABG.  Past Medical History:  Diagnosis Date   Anxiety    Asthma    COPD (chronic obstructive pulmonary disease) (HCC)    Depression    Diabetes mellitus, type II (HCC)    GERD (gastroesophageal reflux disease)    Gout    HTN (hypertension)    IBS (irritable bowel syndrome)    Palpitations    Peripheral artery disease (HCC)     Past Surgical History:  Procedure Laterality Date   APPENDECTOMY  1977   FUNDOPLASTY TRANSTHORACIC  2008   HERNIA REPAIR  2014   RECONSTRUCTION OF NOSE  1978    Current Outpatient Medications  Medication Sig Dispense Refill   Acetylcysteine (N-ACETYL-L-CYSTEINE) POWD 1 Units/day by Does not apply route daily. 30 g 0   albuterol (PROAIR HFA) 108 (90 Base) MCG/ACT inhaler Inhale 1-2 puffs into the lungs every 6 (six) hours as needed for wheezing or shortness of breath. 1 Inhaler 3   amLODipine (NORVASC) 5 MG tablet Take 5 mg by mouth daily.     atorvastatin (LIPITOR) 20 MG tablet Take 1 tablet (20 mg total) by mouth daily. 90 tablet 3   BREO ELLIPTA 200-25 MCG/ACT AEPB Inhale 1 puff into the lungs daily.     clonazePAM (KLONOPIN) 1 MG disintegrating tablet Take 1 tablet (1 mg total) by mouth 3 (three) times daily as  needed. 90 tablet 2   escitalopram (LEXAPRO) 10 MG tablet Take 3 tablets (30 mg total) by mouth daily. 270 tablet 0   Fish Oil-Cholecalciferol (FISH OIL + D3 PO) Take by mouth.     fluticasone (FLONASE) 50 MCG/ACT nasal spray Place 2 sprays into both nostrils daily. 16 g 3   fluticasone (FLOVENT HFA) 110 MCG/ACT inhaler Inhale 2 puffs into the lungs 2 (two) times daily. 1 Inhaler 5   gabapentin (NEURONTIN) 100 MG capsule Take 1 capsule (100 mg total) by mouth 2 (two) times daily. 60 capsule 3   glucose blood test strip Use to test blood sugars 2 times daily. 420 each 3   indomethacin (INDOCIN) 25 MG capsule Take 1 capsule (25 mg total) by mouth 3 (three) times daily as needed. 30 capsule 0   lithium carbonate 300 MG capsule Take 2 capsules (600 mg total) by mouth daily with breakfast AND 3 capsules (900 mg total) daily after supper. 450 capsule 0   losartan (COZAAR) 25 MG tablet Take 1 tablet (25 mg total) by mouth daily. 90 tablet 3   metFORMIN (GLUCOPHAGE) 500 MG tablet Take 1 tablet (500 mg total) by mouth 2 (two) times daily with a meal. 180 tablet 3   metoprolol tartrate (LOPRESSOR) 100 MG tablet Take 1 tablet 2 hours prior to CT Scan 1 tablet 0   mirtazapine (REMERON) 30 MG  tablet Take 2 tablets (60 mg total) by mouth at bedtime. 180 tablet 0   pantoprazole (PROTONIX) 40 MG tablet Take 40 mg by mouth daily.     trazodone (DESYREL) 300 MG tablet Take 1 tablet (300 mg total) by mouth at bedtime. 90 tablet 0   TRUEplus Lancets 30G MISC Use to test blood sugars 2 times daily. 200 each 3   No current facility-administered medications for this visit.   Allergies:  Bee pollen, Milk-related compounds, and Penicillins   Social History: The patient  reports that he quit smoking about 24 years ago. His smoking use included cigarettes. He started smoking about 44 years ago. He has a 30 pack-year smoking history. He has never used smokeless tobacco. He reports current alcohol use. He reports that he  does not use drugs.   Family History: The patient's family history includes Alcohol abuse in his paternal grandfather; Anxiety disorder in his mother; Depression in his mother; Drug abuse in his paternal grandfather; Schizophrenia in his father; Suicidality in his father and paternal grandfather.   ROS:  Please see the history of present illness. Otherwise, complete review of systems is positive for none.  All other systems are reviewed and negative.   Physical Exam: VS:  BP 114/80 (BP Location: Right Arm, Patient Position: Sitting, Cuff Size: Normal)   Pulse 73   Ht 6\' 1"  (1.854 m)   Wt 172 lb (78 kg)   SpO2 96%   BMI 22.69 kg/m , BMI Body mass index is 22.69 kg/m.  Wt Readings from Last 3 Encounters:  11/17/22 172 lb (78 kg)  10/20/22 172 lb 6.4 oz (78.2 kg)  08/09/22 177 lb 8 oz (80.5 kg)    General: Patient appears comfortable at rest. HEENT: Conjunctiva and lids normal, oropharynx clear with moist mucosa. Neck: Supple, no elevated JVP or carotid bruits, no thyromegaly. Lungs: Clear to auscultation, nonlabored breathing at rest. Cardiac: Regular rate and rhythm, no S3 or significant systolic murmur, no pericardial rub. Abdomen: Soft, nontender, no hepatomegaly, bowel sounds present, no guarding or rebound. Extremities: No pitting edema, distal pulses 2+. Skin: Warm and dry. Musculoskeletal: No kyphosis. Neuropsychiatric: Alert and oriented x3, affect grossly appropriate.  Recent Labwork: 07/08/2022: ALT 24; AST 13 10/20/2022: BUN 14; Creat 0.79; Hemoglobin 15.1; Platelets 272; Potassium 4.6; Sodium 140     Component Value Date/Time   CHOL 111 07/08/2022 1329   CHOL 104 07/31/2020 0832   TRIG 314 (H) 07/08/2022 1329   HDL 39 (L) 07/08/2022 1329   HDL 37 (L) 07/31/2020 0832   CHOLHDL 2.8 07/08/2022 1329   VLDL 25.8 11/15/2019 0920   LDLCALC 39 07/08/2022 1329    Assessment and Plan:  # Possibly cardiac chest pain -Ongoing chest pains x 1 month, fleeting in nature,  lasts for 15-30 seconds, occurs mostly with exertion and sometimes with rest. Occurs few times/week. Cardiac risk factors are HTN, DM2. Obtain CTA cardiac and 2D Echo.  # HTN, controlled -Continue amlodipine 5 mg once daily, losartan 25 mg once daily (had dry cough with lisinopril).  # HLD, at goal -Continue atorvastatin 20 mg at bedtime. Goal LDL<100.   I have spent a total of 45 minutes with patient reviewing chart, EKGs, labs and examining patient as well as establishing an assessment and plan that was discussed with the patient.  > 50% of time was spent in direct patient care.    Medication Adjustments/Labs and Tests Ordered: Current medicines are reviewed at length with the patient today.  Concerns regarding medicines are outlined above.   Tests Ordered: Orders Placed This Encounter  Procedures   CT CORONARY MORPH W/CTA COR W/SCORE W/CA W/CM &/OR WO/CM   Basic metabolic panel   EKG 12-Lead   ECHOCARDIOGRAM COMPLETE    Medication Changes: Meds ordered this encounter  Medications   losartan (COZAAR) 25 MG tablet    Sig: Take 1 tablet (25 mg total) by mouth daily.    Dispense:  90 tablet    Refill:  3   metoprolol tartrate (LOPRESSOR) 100 MG tablet    Sig: Take 1 tablet 2 hours prior to CT Scan    Dispense:  1 tablet    Refill:  0    Disposition:  Follow up  pending results  Signed, Ruben Mahler Verne Spurr, MD, 11/19/2022 10:23 AM    [facility] at [facility] [address] [place], [place] [address]

## 2022-11-23 ENCOUNTER — Telehealth (HOSPITAL_COMMUNITY): Payer: Self-pay | Admitting: *Deleted

## 2022-11-23 NOTE — Telephone Encounter (Signed)
Reaching out to patient to offer assistance regarding upcoming cardiac imaging study; pt verbalizes understanding of appt date/time, parking situation and where to check in, pre-test NPO status and medications ordered, and verified current allergies; name and call back number provided for further questions should they arise  Merle Prescott RN Navigator Cardiac Imaging [facility] [phone] office [phone] cell  Patient to take 100mg metoprolol tartrate two hours prior to his cardiac CT scan.  He is aware to arrive at 12pm. 

## 2022-11-24 ENCOUNTER — Ambulatory Visit (HOSPITAL_COMMUNITY)
Admission: RE | Admit: 2022-11-24 | Discharge: 2022-11-24 | Disposition: A | Discharge status: Home / Self Care | Payer: Medicare PPO | Source: Ambulatory Visit | Attending: Internal Medicine | Admitting: Internal Medicine

## 2022-11-24 DIAGNOSIS — R079 Chest pain, unspecified: Secondary | ICD-10-CM | POA: Diagnosis present

## 2022-11-24 DIAGNOSIS — R072 Precordial pain: Secondary | ICD-10-CM

## 2022-11-24 DIAGNOSIS — I2510 Atherosclerotic heart disease of native coronary artery without angina pectoris: Secondary | ICD-10-CM | POA: Insufficient documentation

## 2022-11-24 MED ORDER — IOHEXOL 350 MG/ML SOLN
100.0000 mL | Freq: Once | INTRAVENOUS | Status: AC | PRN
  Administered 2022-11-24: 100 mL via INTRAVENOUS

## 2022-11-24 MED ORDER — NITROGLYCERIN 0.4 MG SL SUBL
0.8000 mg | SUBLINGUAL_TABLET | Freq: Once | SUBLINGUAL | Status: AC
  Administered 2022-11-24: 0.8 mg via SUBLINGUAL

## 2022-11-24 MED ORDER — NITROGLYCERIN 0.4 MG SL SUBL
SUBLINGUAL_TABLET | SUBLINGUAL | Status: AC
  Filled 2022-11-24: qty 2

## 2022-11-29 ENCOUNTER — Ambulatory Visit: Payer: Medicare PPO | Admitting: Adult Health

## 2022-12-02 ENCOUNTER — Encounter: Payer: Self-pay | Admitting: Adult Health

## 2022-12-02 ENCOUNTER — Ambulatory Visit (INDEPENDENT_AMBULATORY_CARE_PROVIDER_SITE_OTHER): Payer: Medicare PPO | Admitting: Adult Health

## 2022-12-02 VITALS — BP 114/68 | HR 89 | Temp 98.1°F | Resp 17 | Ht 73.0 in | Wt 173.4 lb

## 2022-12-02 DIAGNOSIS — J449 Chronic obstructive pulmonary disease, unspecified: Secondary | ICD-10-CM

## 2022-12-02 DIAGNOSIS — F5101 Primary insomnia: Secondary | ICD-10-CM

## 2022-12-02 DIAGNOSIS — G629 Polyneuropathy, unspecified: Secondary | ICD-10-CM

## 2022-12-02 DIAGNOSIS — R350 Frequency of micturition: Secondary | ICD-10-CM

## 2022-12-02 DIAGNOSIS — F411 Generalized anxiety disorder: Secondary | ICD-10-CM

## 2022-12-02 DIAGNOSIS — I10 Essential (primary) hypertension: Secondary | ICD-10-CM

## 2022-12-02 DIAGNOSIS — E118 Type 2 diabetes mellitus with unspecified complications: Secondary | ICD-10-CM | POA: Diagnosis not present

## 2022-12-02 DIAGNOSIS — Z113 Encounter for screening for infections with a predominantly sexual mode of transmission: Secondary | ICD-10-CM

## 2022-12-02 DIAGNOSIS — F3181 Bipolar II disorder: Secondary | ICD-10-CM

## 2022-12-02 MED ORDER — GABAPENTIN 100 MG PO CAPS
200.0000 mg | ORAL_CAPSULE | Freq: Every day | ORAL | 3 refills | Status: DC
Start: 2022-12-02 — End: 2023-12-07

## 2022-12-02 NOTE — Progress Notes (Signed)
[facility] clinic  Provider:  Kenard Gower DNP  Code Status:  Full Code  Goals of Care:     12/02/2022    9:45 AM  Advanced Directives  Does Patient Have a Medical Advance Directive? No  Would patient like information on creating a medical advance directive? No - Patient declined     Chief Complaint  Patient presents with   Medical Management of Chronic Issues    Patient states he is here for a 3 month follow up and has some concerns     HPI: Patient is a 62 y.o. male seen today for a 48-month follow up of chronic medical issues.  Neuropathy - he feels electricity on top of left foot at night, takes Neurontin  Essential hypertension  -  BP 114/68, takes  Amlodipine and Losartan  Chronic obstructive pulmonary disease, unspecified COPD type (HCC) -  no wheezing, use Breo Ellipta AEPB, gets short of breath when outside and it is hot, stopped smoking in 2000  Type 2 diabetes mellitus with complication, without long-term current use of insulin (HCC) -  average CBG at home 134, takes Metformin  GAD (generalized anxiety disorder)  -  "feels OK", takes Clonazepam  Bipolar 2 disorder, major depressive episode (HCC) -  takes Lithium  Primary insomnia -  sleeps well, takes Remeron and Trazodone  Urinary frequency -  occurs at least 3X/day, changes underwear once a day    Past Medical History:  Diagnosis Date   Anxiety    Asthma    COPD (chronic obstructive pulmonary disease) (HCC)    Depression    Diabetes mellitus, type II (HCC)    GERD (gastroesophageal reflux disease)    Gout    HTN (hypertension)    IBS (irritable bowel syndrome)    Palpitations    Peripheral artery disease (HCC)     Past Surgical History:  Procedure Laterality Date   APPENDECTOMY  1977   FUNDOPLASTY TRANSTHORACIC  2008   HERNIA REPAIR  2014   RECONSTRUCTION OF NOSE  1978    Allergies  Allergen Reactions   Bee Pollen    Milk-Related Compounds Diarrhea   Penicillins Rash    Outpatient  Encounter Medications as of 12/02/2022  Medication Sig   Acetylcysteine (N-ACETYL-L-CYSTEINE) POWD 1 Units/day by Does not apply route daily.   albuterol (PROAIR HFA) 108 (90 Base) MCG/ACT inhaler Inhale 1-2 puffs into the lungs every 6 (six) hours as needed for wheezing or shortness of breath.   amLODipine (NORVASC) 5 MG tablet Take 5 mg by mouth daily.   atorvastatin (LIPITOR) 20 MG tablet Take 1 tablet (20 mg total) by mouth daily.   BREO ELLIPTA 200-25 MCG/ACT AEPB Inhale 1 puff into the lungs daily.   clonazePAM (KLONOPIN) 1 MG disintegrating tablet Take 1 tablet (1 mg total) by mouth 3 (three) times daily as needed.   escitalopram (LEXAPRO) 10 MG tablet Take 3 tablets (30 mg total) by mouth daily.   Fish Oil-Cholecalciferol (FISH OIL + D3 PO) Take by mouth.   fluticasone (FLONASE) 50 MCG/ACT nasal spray Place 2 sprays into both nostrils daily.   fluticasone (FLOVENT HFA) 110 MCG/ACT inhaler Inhale 2 puffs into the lungs 2 (two) times daily.   gabapentin (NEURONTIN) 100 MG capsule Take 1 capsule (100 mg total) by mouth 2 (two) times daily.   glucose blood test strip Use to test blood sugars 2 times daily.   indomethacin (INDOCIN) 25 MG capsule Take 1 capsule (25 mg total) by mouth 3 (  three) times daily as needed.   lithium carbonate 300 MG capsule Take 2 capsules (600 mg total) by mouth daily with breakfast AND 3 capsules (900 mg total) daily after supper.   losartan (COZAAR) 25 MG tablet Take 1 tablet (25 mg total) by mouth daily.   metFORMIN (GLUCOPHAGE) 500 MG tablet Take 1 tablet (500 mg total) by mouth 2 (two) times daily with a meal.   metoprolol tartrate (LOPRESSOR) 100 MG tablet Take 1 tablet 2 hours prior to CT Scan   mirtazapine (REMERON) 30 MG tablet Take 2 tablets (60 mg total) by mouth at bedtime.   pantoprazole (PROTONIX) 40 MG tablet Take 40 mg by mouth daily.   trazodone (DESYREL) 300 MG tablet Take 1 tablet (300 mg total) by mouth at bedtime.   TRUEplus Lancets 30G MISC Use  to test blood sugars 2 times daily.   No facility-administered encounter medications on file as of 12/02/2022.    Review of Systems:  Review of Systems  Constitutional:  Negative for activity change, appetite change and fever.  HENT:  Negative for sore throat.   Eyes: Negative.   Cardiovascular:  Negative for chest pain and leg swelling.  Gastrointestinal:  Negative for abdominal distention, diarrhea and vomiting.  Genitourinary:  Positive for frequency and urgency. Negative for dysuria and hematuria.  Skin:  Negative for color change.  Neurological:  Negative for dizziness and headaches.  Psychiatric/Behavioral:  Negative for behavioral problems and sleep disturbance. The patient is not nervous/anxious.     Health Maintenance  Topic Date Due   Hepatitis C Screening  Never done   FOOT EXAM  07/13/2018   OPHTHALMOLOGY EXAM  08/02/2018   Medicare Annual Wellness (AWV)  02/28/2020   Diabetic kidney evaluation - Urine ACR  11/14/2020   COVID-19 Vaccine (7 - 2023-24 season) 03/31/2022   INFLUENZA VACCINE  08/01/2023 (Originally 12/02/2022)   HEMOGLOBIN A1C  01/08/2023   Diabetic kidney evaluation - eGFR measurement  10/20/2023   Colonoscopy  05/04/2027   DTaP/Tdap/Td (4 - Td or Tdap) 11/14/2029   HIV Screening  Completed   Zoster Vaccines- Shingrix  Completed   HPV VACCINES  Aged Out    Physical Exam: Vitals:   12/02/22 0942  BP: 114/68  Pulse: 89  Resp: 17  Temp: 98.1 F (36.7 C)  TempSrc: Temporal  SpO2: 95%  Weight: 173 lb 6.4 oz (78.7 kg)  Height: 6\' 1"  (1.854 m)   Body mass index is 22.88 kg/m. Physical Exam Constitutional:      Appearance: Normal appearance.  HENT:     Head: Normocephalic and atraumatic.     Mouth/Throat:     Mouth: Mucous membranes are moist.  Eyes:     Conjunctiva/sclera: Conjunctivae normal.  Cardiovascular:     Rate and Rhythm: Normal rate and regular rhythm.     Pulses: Normal pulses.     Heart sounds: Normal heart sounds.  Pulmonary:      Effort: Pulmonary effort is normal.     Breath sounds: Normal breath sounds.  Abdominal:     General: Bowel sounds are normal.     Palpations: Abdomen is soft.  Musculoskeletal:        General: No swelling. Normal range of motion.     Cervical back: Normal range of motion.  Skin:    General: Skin is warm and dry.  Neurological:     General: No focal deficit present.     Mental Status: He is alert and oriented to person, place,  and time.  Psychiatric:        Mood and Affect: Mood normal.        Behavior: Behavior normal.        Thought Content: Thought content normal.        Judgment: Judgment normal.     Labs reviewed: Basic Metabolic Panel: Recent Labs    07/08/22 1329 10/20/22 1022  NA 142 140  K 4.6 4.6  CL 107 106  CO2 24 27  GLUCOSE 100* 102*  BUN 17 14  CREATININE 0.95 0.79  CALCIUM 10.0 9.8   Liver Function Tests: Recent Labs    07/08/22 1329  AST 13  ALT 24  BILITOT 2.5*  PROT 7.2   No results for input(s): "LIPASE", "AMYLASE" in the last 8760 hours. No results for input(s): "AMMONIA" in the last 8760 hours. CBC: Recent Labs    07/08/22 1329 10/20/22 1022  WBC 6.9 5.1  NEUTROABS 4,326 3,035  HGB 15.3 15.1  HCT 44.9 44.7  MCV 88.4 87.1  PLT 307 272   Lipid Panel: Recent Labs    07/08/22 1329  CHOL 111  HDL 39*  LDLCALC 39  TRIG 528*  CHOLHDL 2.8   Lab Results  Component Value Date   HGBA1C 5.7 (H) 07/08/2022    Procedures since last visit: CT CORONARY MORPH W/CTA COR W/SCORE W/CA W/CM &/OR WO/CM  Addendum Date: 12/01/2022   ADDENDUM REPORT: 12/01/2022 19:30 EXAM: OVER-READ INTERPRETATION  CT CHEST The following report is an over-read performed by radiologist Dr. Aram Candela of [facility] on 12/01/2022. This over-read does not include interpretation of cardiac or coronary anatomy or pathology. The coronary calcium score/coronary CTA interpretation by the cardiologist is attached. COMPARISON:  None. FINDINGS:  Cardiovascular: There are no significant extracardiac vascular findings. Mediastinum/Nodes: There are no enlarged lymph nodes within the visualized mediastinum. Lungs/Pleura: There is no pleural effusion. There is evidence of mild paraseptal emphysematous lung disease. Upper abdomen: No significant findings in the visualized upper abdomen. Musculoskeletal/Chest wall: No chest wall mass or suspicious osseous findings within the visualized chest. IMPRESSION: No significant extracardiac findings within the visualized chest. Electronically Signed   By: Aram Candela M.D.   On: 12/01/2022 19:30   Result Date: 12/01/2022 CLINICAL DATA:  Chest pain EXAM: Cardiac CTA MEDICATIONS: Sub lingual nitro. 4mg  x 2 TECHNIQUE: The patient was scanned on a Siemens 192 slice scanner. Gantry rotation speed was 250 msecs. Collimation was 0.6 mm. A 100 kV prospective scan was triggered in the ascending thoracic aorta at 35-75% of the R-R interval. Average HR during the scan was 60 bpm. The 3D data set was interpreted on a dedicated work station using MPR, MIP and VRT modes. A total of 80cc of contrast was used. FINDINGS: Non-cardiac: See separate report from [facility]. Pulmonary veins drain normally to the left atrium. No LA appendage thrombus. Calcium Score: 231 Agatston units. Coronary Arteries: Left dominant with no anomalies LM: No plaque or stenosis. LAD system: Calcified plaque in the proximal LAD, mild (25-49%) stenosis. Circumflex system: Large dominant vessel provides left PDA. No plaque or stenosis. RCA system: Small nondominant vessel, no plaque or stenosis. IMPRESSION: 1. Coronary artery calcium score 231 Agatston units. This places the patient in the 74th percentile for age and gender, suggesting intermediate risk for future cardiac events. 2.  Mild stenosis in the proximal LAD. [employer] account executive Electronically Signed: By: Marca Ancona M.D. On: 11/24/2022 14:16    Assessment/Plan  1. Neuropathy -  will  increase Gabapentin from 100 mg at bedtime to 200 mg at bedtime - gabapentin (NEURONTIN) 100 MG capsule; Take 2 capsules (200 mg total) by mouth at bedtime.  Dispense: 60 capsule; Refill: 3  2. Essential hypertension -  BP stable -  continue Amlodipine and Losartan -  check BP daily, log and bring to next appointment  3. Chronic obstructive pulmonary disease, unspecified COPD type (HCC) -  no wheezing, stable -  continue Bereo Ellipta AEPB  4. Type 2 diabetes mellitus with complication, without long-term current use of insulin (HCC) Lab Results  Component Value Date   HGBA1C 5.7 (H) 07/08/2022   -  CBGs at home stable -  continue Metformin - Microalbumin/Creatinine Ratio, Urine; Future  5. GAD (generalized anxiety disorder) -  mood is stable -  continue Clonazepam  6. Bipolar 2 disorder, major depressive episode (HCC) -  mood is stable -  continue Lithium  7. Primary insomnia -  sleeps well at night, at least 6 hours/day -  continue Trazodone and Remeron  8. Urinary frequency -  has urinary urgency and frequency - Urinalysis; Future - Urine Culture; Future  9. Screening for STD (sexually transmitted disease) - Hep C Antibody    Labs/tests ordered:   Hep C antibody, urinalysis and urine culture   Next appt:  01/06/2023

## 2022-12-03 NOTE — Progress Notes (Signed)
Hep C antibody negative.

## 2022-12-28 ENCOUNTER — Ambulatory Visit (HOSPITAL_COMMUNITY): Payer: Medicare PPO

## 2022-12-31 ENCOUNTER — Other Ambulatory Visit: Payer: Medicare PPO

## 2023-01-06 ENCOUNTER — Encounter: Payer: Medicare PPO | Admitting: Adult Health

## 2023-01-10 ENCOUNTER — Ambulatory Visit (HOSPITAL_BASED_OUTPATIENT_CLINIC_OR_DEPARTMENT_OTHER): Payer: Medicare PPO | Admitting: Student

## 2023-01-10 ENCOUNTER — Encounter (HOSPITAL_COMMUNITY): Payer: Self-pay | Admitting: Student

## 2023-01-10 DIAGNOSIS — F4310 Post-traumatic stress disorder, unspecified: Secondary | ICD-10-CM

## 2023-01-10 DIAGNOSIS — F5105 Insomnia due to other mental disorder: Secondary | ICD-10-CM

## 2023-01-10 DIAGNOSIS — F3181 Bipolar II disorder: Secondary | ICD-10-CM | POA: Diagnosis not present

## 2023-01-10 DIAGNOSIS — F411 Generalized anxiety disorder: Secondary | ICD-10-CM

## 2023-01-10 DIAGNOSIS — F424 Excoriation (skin-picking) disorder: Secondary | ICD-10-CM | POA: Diagnosis not present
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
## 2023-01-11 MED ORDER — ESCITALOPRAM OXALATE 20 MG PO TABS: 20.0000 mg | ORAL_TABLET | Freq: Every day | ORAL | 1 refills | Status: DC

## 2023-01-11 MED ORDER — CLONAZEPAM 1 MG PO TBDP
1.0000 mg | ORAL_TABLET | Freq: Two times a day (BID) | ORAL | 1 refills | Status: DC | PRN
Start: 2023-01-11 — End: 2023-02-24

## 2023-01-11 MED ORDER — TRAZODONE HCL 300 MG PO TABS
300.0000 mg | ORAL_TABLET | Freq: Every day | ORAL | 0 refills | Status: DC
Start: 2023-01-11 — End: 2023-02-21

## 2023-01-11 MED ORDER — LITHIUM CARBONATE 300 MG PO CAPS
ORAL_CAPSULE | ORAL | 0 refills | Status: DC
Start: 2023-01-11 — End: 2023-04-06

## 2023-01-13 ENCOUNTER — Encounter: Payer: Self-pay | Admitting: Adult Health

## 2023-01-13 ENCOUNTER — Ambulatory Visit (INDEPENDENT_AMBULATORY_CARE_PROVIDER_SITE_OTHER): Payer: Medicare PPO | Admitting: Adult Health

## 2023-01-13 VITALS — BP 122/78 | HR 77 | Temp 97.7°F | Resp 16 | Ht 73.0 in | Wt 177.2 lb

## 2023-01-13 DIAGNOSIS — I10 Essential (primary) hypertension: Secondary | ICD-10-CM

## 2023-01-13 DIAGNOSIS — F3181 Bipolar II disorder: Secondary | ICD-10-CM

## 2023-01-13 DIAGNOSIS — Z2821 Immunization not carried out because of patient refusal: Secondary | ICD-10-CM

## 2023-01-13 DIAGNOSIS — H9123 Sudden idiopathic hearing loss, bilateral: Secondary | ICD-10-CM

## 2023-01-13 DIAGNOSIS — F411 Generalized anxiety disorder: Secondary | ICD-10-CM

## 2023-01-13 DIAGNOSIS — G629 Polyneuropathy, unspecified: Secondary | ICD-10-CM

## 2023-01-13 DIAGNOSIS — E118 Type 2 diabetes mellitus with unspecified complications: Secondary | ICD-10-CM

## 2023-01-13 DIAGNOSIS — J449 Chronic obstructive pulmonary disease, unspecified: Secondary | ICD-10-CM

## 2023-01-13 DIAGNOSIS — F5101 Primary insomnia: Secondary | ICD-10-CM

## 2023-01-13 DIAGNOSIS — E785 Hyperlipidemia, unspecified: Secondary | ICD-10-CM

## 2023-01-13 NOTE — Progress Notes (Signed)
[facility] clinic  Provider:  Kenard Gower DNP  Code Status:  Full Code  Goals of Care:     01/13/2023    9:58 AM  Advanced Directives  Does Patient Have a Medical Advance Directive? No  Would patient like information on creating a medical advance directive? No - Patient declined     Chief Complaint  Patient presents with   Acute Visit    Patient states has been sick for 3 week and had antibiotic and still not feeling better     HPI: Patient is a 62 y.o. male seen today for an acute visit for hearing loss. He attended a [facility] convention in [place] 3 weeks ago. He stated that most of the people he went with had COVID-19 infection. When he got back home, he started coughing with brownish phlegm. He tested negative for COVID-19 and was diagnosed to have ear infection and respiratory infection. He completed 10 days of antibiotic and Prednisone.   He was accompanied today in the clinic by his wife who is also having a medical visit at [facility]. He complained of having decrease hearing and that he hears muffled sounds. He denies having fever and chills. He has good appetite.   Past Medical History:  Diagnosis Date   Anxiety    Asthma    COPD (chronic obstructive pulmonary disease) (HCC)    Depression    Diabetes mellitus, type II (HCC)    GERD (gastroesophageal reflux disease)    Gout    HTN (hypertension)    IBS (irritable bowel syndrome)    Palpitations    Peripheral artery disease (HCC)     Past Surgical History:  Procedure Laterality Date   APPENDECTOMY  1977   FUNDOPLASTY TRANSTHORACIC  2008   HERNIA REPAIR  2014   RECONSTRUCTION OF NOSE  1978    Allergies  Allergen Reactions   Bee Pollen    Milk-Related Compounds Diarrhea   Penicillins Rash    Outpatient Encounter Medications as of 01/13/2023  Medication Sig   albuterol (PROAIR HFA) 108 (90 Base) MCG/ACT inhaler Inhale 1-2 puffs into the lungs every 6 (six) hours as needed for wheezing or shortness of breath.    amLODipine (NORVASC) 5 MG tablet Take 5 mg by mouth daily.   atorvastatin (LIPITOR) 20 MG tablet Take 1 tablet (20 mg total) by mouth daily.   BREO ELLIPTA 200-25 MCG/ACT AEPB Inhale 1 puff into the lungs daily.   clonazePAM (KLONOPIN) 1 MG disintegrating tablet Take 1 tablet (1 mg total) by mouth 2 (two) times daily as needed.   escitalopram (LEXAPRO) 20 MG tablet Take 1 tablet (20 mg total) by mouth daily.   Fish Oil-Cholecalciferol (FISH OIL + D3 PO) Take by mouth.   fluticasone (FLONASE) 50 MCG/ACT nasal spray Place 2 sprays into both nostrils daily.   fluticasone (FLOVENT HFA) 110 MCG/ACT inhaler Inhale 2 puffs into the lungs 2 (two) times daily.   gabapentin (NEURONTIN) 100 MG capsule Take 2 capsules (200 mg total) by mouth at bedtime.   glucose blood test strip Use to test blood sugars 2 times daily.   indomethacin (INDOCIN) 25 MG capsule Take 1 capsule (25 mg total) by mouth 3 (three) times daily as needed.   lithium carbonate 300 MG capsule Take 2 capsules (600 mg total) by mouth daily with breakfast AND 3 capsules (900 mg total) daily after supper.   losartan (COZAAR) 25 MG tablet Take 1 tablet (25 mg total) by mouth daily.   metFORMIN (  GLUCOPHAGE) 500 MG tablet Take 1 tablet (500 mg total) by mouth 2 (two) times daily with a meal.   metoprolol tartrate (LOPRESSOR) 100 MG tablet Take 1 tablet 2 hours prior to CT Scan   pantoprazole (PROTONIX) 40 MG tablet Take 40 mg by mouth daily.   trazodone (DESYREL) 300 MG tablet Take 1 tablet (300 mg total) by mouth at bedtime.   TRUEplus Lancets 30G MISC Use to test blood sugars 2 times daily.   No facility-administered encounter medications on file as of 01/13/2023.    Review of Systems:  Review of Systems  Constitutional:  Negative for activity change, appetite change and fever.  HENT:  Positive for hearing loss. Negative for ear pain, sore throat and tinnitus.   Eyes: Negative.   Cardiovascular:  Negative for chest pain and leg swelling.   Gastrointestinal:  Negative for abdominal distention, diarrhea and vomiting.  Genitourinary:  Negative for dysuria, frequency and urgency.  Musculoskeletal:  Positive for neck pain.  Skin:  Negative for color change.  Neurological:  Negative for dizziness and headaches.  Psychiatric/Behavioral:  Negative for behavioral problems and sleep disturbance. The patient is not nervous/anxious.     Health Maintenance  Topic Date Due   OPHTHALMOLOGY EXAM  08/02/2018   Medicare Annual Wellness (AWV)  02/28/2020   Diabetic kidney evaluation - Urine ACR  11/14/2020   COVID-19 Vaccine (7 - 2023-24 season) 01/02/2023   HEMOGLOBIN A1C  01/08/2023   INFLUENZA VACCINE  08/01/2023 (Originally 12/02/2022)   Diabetic kidney evaluation - eGFR measurement  10/20/2023   FOOT EXAM  12/02/2023   Colonoscopy  05/04/2027   DTaP/Tdap/Td (4 - Td or Tdap) 11/14/2029   Hepatitis C Screening  Completed   HIV Screening  Completed   Zoster Vaccines- Shingrix  Completed   HPV VACCINES  Aged Out    Physical Exam: Vitals:   01/13/23 0955  BP: 122/78  Pulse: 77  Resp: 16  Temp: 97.7 F (36.5 C)  TempSrc: Temporal  SpO2: 97%  Weight: 177 lb 3.2 oz (80.4 kg)  Height: 6\' 1"  (1.854 m)   Body mass index is 23.38 kg/m. Physical Exam Constitutional:      Appearance: Normal appearance.  HENT:     Head: Normocephalic and atraumatic.     Mouth/Throat:     Mouth: Mucous membranes are moist.  Eyes:     Conjunctiva/sclera: Conjunctivae normal.  Cardiovascular:     Rate and Rhythm: Normal rate and regular rhythm.     Pulses: Normal pulses.     Heart sounds: Normal heart sounds.  Pulmonary:     Effort: Pulmonary effort is normal.     Breath sounds: Normal breath sounds.  Abdominal:     General: Bowel sounds are normal.     Palpations: Abdomen is soft.  Musculoskeletal:        General: No swelling. Normal range of motion.     Cervical back: Normal range of motion.  Skin:    General: Skin is warm and dry.   Neurological:     General: No focal deficit present.     Mental Status: He is alert and oriented to person, place, and time.  Psychiatric:        Mood and Affect: Mood normal.        Behavior: Behavior normal.        Thought Content: Thought content normal.        Judgment: Judgment normal.    Labs reviewed: Basic Metabolic Panel: Recent Labs  07/08/22 1329 10/20/22 1022  NA 142 140  K 4.6 4.6  CL 107 106  CO2 24 27  GLUCOSE 100* 102*  BUN 17 14  CREATININE 0.95 0.79  CALCIUM 10.0 9.8   Liver Function Tests: Recent Labs    07/08/22 1329  AST 13  ALT 24  BILITOT 2.5*  PROT 7.2   No results for input(s): "LIPASE", "AMYLASE" in the last 8760 hours. No results for input(s): "AMMONIA" in the last 8760 hours. CBC: Recent Labs    07/08/22 1329 10/20/22 1022  WBC 6.9 5.1  NEUTROABS 4,326 3,035  HGB 15.3 15.1  HCT 44.9 44.7  MCV 88.4 87.1  PLT 307 272   Lipid Panel: Recent Labs    07/08/22 1329  CHOL 111  HDL 39*  LDLCALC 39  TRIG 244*  CHOLHDL 2.8   Lab Results  Component Value Date   HGBA1C 5.7 (H) 07/08/2022    Procedures since last visit: No results found.  Assessment/Plan  1. Sudden idiopathic hearing loss of both ears -  post respiratory and ear infection -  no erythema nor drainage from ears - Ambulatory referral to ENT  2. Type 2 diabetes mellitus with complication, without long-term current use of insulin (HCC) Lab Results  Component Value Date   HGBA1C 5.7 (H) 07/08/2022    -  average blood sugar at home 121 -  continue Metformin - Hemoglobin A1C - Microalbumin/Creatinine Ratio, Urine - CBC with Differential/Platelets  3. Essential hypertension -  BP 122/78, stable -  continue current meds - Complete Metabolic Panel with eGFR  4. GAD (generalized anxiety disorder) -  mood is stable -  continue Klonopin and Lexapro  5. Neuropathy -  stable -  continue Gabapentin  6. Bipolar 2 disorder, major depressive episode  (HCC) -  continue Lithium -  follows up with psych  7. Chronic obstructive pulmonary disease, unspecified COPD type (HCC) -  denies SOB nor wheezing -  continue Breo Ellipta  and Fluticasone puffs  8. Primary insomnia -  Remeron was recently discontinued -  continue Trazodone  9. Hyperlipidemia LDL goal <100 Lab Results  Component Value Date   CHOL 111 07/08/2022   HDL 39 (L) 07/08/2022   LDLCALC 39 07/08/2022   TRIG 314 (H) 07/08/2022   CHOLHDL 2.8 07/08/2022    -   continue Atorvastatin - Lipid panel; Future  10. Flu vaccine refused -  declined    Labs/tests ordered:  lipid panel, CBC, CMP, A1C and urine microalbumin  Next appt:  Visit date not found

## 2023-01-14 LAB — CBC WITH DIFFERENTIAL/PLATELET
Absolute Monocytes: 799 {cells}/uL (ref 200–950)
Basophils Absolute: 58 {cells}/uL (ref 0–200)
Basophils Relative: 0.8 %
Eosinophils Absolute: 108 {cells}/uL (ref 15–500)
Eosinophils Relative: 1.5 %
HCT: 42.1 % (ref 38.5–50.0)
Hemoglobin: 14 g/dL (ref 13.2–17.1)
Lymphs Abs: 1606 {cells}/uL (ref 850–3900)
MCH: 29 pg (ref 27.0–33.0)
MCHC: 33.3 g/dL (ref 32.0–36.0)
MCV: 87.2 fL (ref 80.0–100.0)
MPV: 10.1 fL (ref 7.5–12.5)
Monocytes Relative: 11.1 %
Neutro Abs: 4630 {cells}/uL (ref 1500–7800)
Neutrophils Relative %: 64.3 %
Platelets: 304 10*3/uL (ref 140–400)
RBC: 4.83 10*6/uL (ref 4.20–5.80)
RDW: 13.1 % (ref 11.0–15.0)
Total Lymphocyte: 22.3 %
WBC: 7.2 10*3/uL (ref 3.8–10.8)

## 2023-01-14 LAB — COMPLETE METABOLIC PANEL WITH GFR
AG Ratio: 1.8 (calc) (ref 1.0–2.5)
ALT: 26 U/L (ref 9–46)
AST: 15 U/L (ref 10–35)
Albumin: 4.4 g/dL (ref 3.6–5.1)
Alkaline phosphatase (APISO): 81 U/L (ref 35–144)
BUN: 15 mg/dL (ref 7–25)
CO2: 25 mmol/L (ref 20–32)
Calcium: 9.6 mg/dL (ref 8.6–10.3)
Chloride: 106 mmol/L (ref 98–110)
Creat: 0.77 mg/dL (ref 0.70–1.35)
Globulin: 2.4 g/dL (ref 1.9–3.7)
Glucose, Bld: 102 mg/dL (ref 65–139)
Potassium: 4.6 mmol/L (ref 3.5–5.3)
Sodium: 139 mmol/L (ref 135–146)
Total Bilirubin: 1.7 mg/dL — ABNORMAL HIGH (ref 0.2–1.2)
Total Protein: 6.8 g/dL (ref 6.1–8.1)
eGFR: 101 mL/min/{1.73_m2} (ref >=60)

## 2023-01-14 LAB — MICROALBUMIN / CREATININE URINE RATIO
Creatinine, Urine: 99 mg/dL (ref 20–320)
Microalb Creat Ratio: 3 mg/g{creat} (ref <30)
Microalb, Ur: 0.3 mg/dL

## 2023-01-14 LAB — HEMOGLOBIN A1C
Hgb A1c MFr Bld: 6.1 %{Hb} — ABNORMAL HIGH (ref <5.7)
Mean Plasma Glucose: 128 mg/dL
eAG (mmol/L): 7.1 mmol/L

## 2023-01-14 NOTE — Progress Notes (Signed)
-   total bilirubin 1.7, down from 2.5 (done 07/08/22) -  urine microalbumin normal -  CBC normal, no anemia -  A1C 6.1, slightly up from 5.7 ( done 07/08/22), continue current medications

## 2023-01-17 NOTE — Addendum Note (Signed)
Addended by: Everlena Cooper on: 01/17/2023 09:14 AM   Modules accepted: Level of Service

## 2023-01-27 ENCOUNTER — Ambulatory Visit (INDEPENDENT_AMBULATORY_CARE_PROVIDER_SITE_OTHER): Payer: Medicare PPO | Admitting: Otolaryngology

## 2023-01-27 ENCOUNTER — Encounter (INDEPENDENT_AMBULATORY_CARE_PROVIDER_SITE_OTHER): Payer: Self-pay | Admitting: Otolaryngology

## 2023-01-27 VITALS — BP 133/89 | HR 73 | Ht 73.0 in | Wt 177.0 lb

## 2023-01-27 DIAGNOSIS — H6591 Unspecified nonsuppurative otitis media, right ear: Secondary | ICD-10-CM | POA: Diagnosis not present

## 2023-01-27 DIAGNOSIS — J3089 Other allergic rhinitis: Secondary | ICD-10-CM

## 2023-01-27 DIAGNOSIS — R0982 Postnasal drip: Secondary | ICD-10-CM

## 2023-01-27 DIAGNOSIS — H9193 Unspecified hearing loss, bilateral: Secondary | ICD-10-CM | POA: Diagnosis not present

## 2023-01-27 DIAGNOSIS — H6993 Unspecified Eustachian tube disorder, bilateral: Secondary | ICD-10-CM

## 2023-01-27 DIAGNOSIS — H918X3 Other specified hearing loss, bilateral: Secondary | ICD-10-CM

## 2023-01-27 MED ORDER — FLUTICASONE PROPIONATE 50 MCG/ACT NA SUSP: 2.0000 | Freq: Every day | NASAL | 3 refills | Status: DC

## 2023-01-27 MED ORDER — METHYLPREDNISOLONE 4 MG PO TBPK: ORAL_TABLET | ORAL | 0 refills | Status: DC

## 2023-01-27 NOTE — Progress Notes (Signed)
Dear Dr. Grayce Sessions, Here is my assessment for our mutual patient, Matthew Ortiz. Thank you for allowing me the opportunity to care for your patient. Please do not hesitate to contact me should you have any other questions. Sincerely, Dr. Jovita Kussmaul  Otolaryngology Clinic Note Referring provider: Dr. Grayce Sessions HPI:  Matthew Ortiz is a 62 y.o. male kindly referred by Dr. Grayce Sessions for evaluation of bilateral hearing loss and ear discomfort. Patient reports that he was on a trip about 4 weeks ago, and then had URI symptoms (cough, congestion, runny nose) and then developed an ear symptoms - initially had ear pain/discomfort running down his jaw and some fullness/muffled hearing. In additoin, he had some popping, clicking, and tinnitus, and muffled hearing ("bucket over his head"). He never had acute loss, just muffled. He reports that the pain is now gone but the other symptoms persist, though he is improving from a hearing standpoint on left. Not much on right.  Ears otherwise not a problem, no noise exposure, barotrauma, not on vestibular suppressants. No prior ear surgery.   He was prescribed a medrol dose pack, and cefdinir. Did not improve much.  He still feels congested from sinonasal standpoint and some PND but no discolored drainage. He is currently on azelastine - helping him a lot. He still has bilateral nasal congestion, some mucoid anterior rhinorrhea. He has had two sinus surgeries (1997 last time - [place]). He does not think he gets frequent sinus infections but thinks his allergies are not good but they help. Allergy testing - not done in the past; allergic to dust and mold he thinks. Does have typical AR symptoms - eyes itch, throat itches, eyes run He takes claritin; does not use a neti pot. No flonase use currently, but only when allergies get bad.   PMHx: T2DM, HTN, GAD, Neuropathy, Mood disorder, COPD, Insomnia  Not on any blood thinners.  No problems with  bleeding or anesthesia in the past.   Eos 01/13/2023: 108 No CT scan of your sinuse  Quick smoking in 2000 (20 pack year history).   PMH/Meds/All/SocHx/FamHx/ROS:   Past Medical History:  Diagnosis Date   Anxiety    Asthma    COPD (chronic obstructive pulmonary disease) (HCC)    Depression    Diabetes mellitus, type II (HCC)    GERD (gastroesophageal reflux disease)    Gout    HTN (hypertension)    IBS (irritable bowel syndrome)    Palpitations    Peripheral artery disease (HCC)     Past Surgical History:  Procedure Laterality Date   APPENDECTOMY  1977   FUNDOPLASTY TRANSTHORACIC  2008   HERNIA REPAIR  2014   RECONSTRUCTION OF NOSE  1978   Denies history of head or neck surgery  Family History  Problem Relation Age of Onset   Depression Mother    Anxiety disorder Mother    Schizophrenia Father    Suicidality Father    Suicidality Paternal Grandfather    Drug abuse Paternal Grandfather    Alcohol abuse Paternal Grandfather    No family history of bleeding disorders or difficulty with anesthesia  Social Connections: Not on file    Tobacco: denies currently (see above)   Current Outpatient Medications:    albuterol (PROAIR HFA) 108 (90 Base) MCG/ACT inhaler, Inhale 1-2 puffs into the lungs every 6 (six) hours as needed for wheezing or shortness of breath., Disp: 1 Inhaler, Rfl: 3   amLODipine (NORVASC) 5 MG tablet, Take 5 mg by mouth daily.,  Disp: , Rfl:    atorvastatin (LIPITOR) 20 MG tablet, Take 1 tablet (20 mg total) by mouth daily., Disp: 90 tablet, Rfl: 3   BREO ELLIPTA 200-25 MCG/ACT AEPB, Inhale 1 puff into the lungs daily., Disp: , Rfl:    clonazePAM (KLONOPIN) 1 MG disintegrating tablet, Take 1 tablet (1 mg total) by mouth 2 (two) times daily as needed., Disp: 60 tablet, Rfl: 1   escitalopram (LEXAPRO) 20 MG tablet, Take 1 tablet (20 mg total) by mouth daily., Disp: 30 tablet, Rfl: 1   Fish Oil-Cholecalciferol (FISH OIL + D3 PO), Take by mouth., Disp: ,  Rfl:    fluticasone (FLONASE) 50 MCG/ACT nasal spray, Place 2 sprays into both nostrils daily., Disp: 16 g, Rfl: 3   fluticasone (FLOVENT HFA) 110 MCG/ACT inhaler, Inhale 2 puffs into the lungs 2 (two) times daily., Disp: 1 Inhaler, Rfl: 5   gabapentin (NEURONTIN) 100 MG capsule, Take 2 capsules (200 mg total) by mouth at bedtime., Disp: 60 capsule, Rfl: 3   glucose blood test strip, Use to test blood sugars 2 times daily., Disp: 420 each, Rfl: 3   indomethacin (INDOCIN) 25 MG capsule, Take 1 capsule (25 mg total) by mouth 3 (three) times daily as needed., Disp: 30 capsule, Rfl: 0   lithium carbonate 300 MG capsule, Take 2 capsules (600 mg total) by mouth daily with breakfast AND 3 capsules (900 mg total) daily after supper., Disp: 450 capsule, Rfl: 0   losartan (COZAAR) 25 MG tablet, Take 1 tablet (25 mg total) by mouth daily., Disp: 90 tablet, Rfl: 3   metFORMIN (GLUCOPHAGE) 500 MG tablet, Take 1 tablet (500 mg total) by mouth 2 (two) times daily with a meal., Disp: 180 tablet, Rfl: 3   metoprolol tartrate (LOPRESSOR) 100 MG tablet, Take 1 tablet 2 hours prior to CT Scan, Disp: 1 tablet, Rfl: 0   pantoprazole (PROTONIX) 40 MG tablet, Take 40 mg by mouth daily., Disp: , Rfl:    trazodone (DESYREL) 300 MG tablet, Take 1 tablet (300 mg total) by mouth at bedtime., Disp: 90 tablet, Rfl: 0   TRUEplus Lancets 30G MISC, Use to test blood sugars 2 times daily., Disp: 200 each, Rfl: 3  A 20-point ROS was performed with pertinent positives/negatives noted in the HPI   Physical Exam:   BP 133/89 (BP Location: Right Arm, Patient Position: Sitting, Cuff Size: Normal)   Pulse 73   Ht 6\' 1"  (1.854 m)   Wt 177 lb (80.3 kg)   SpO2 94%   BMI 23.35 kg/m   Salient findings:  CN II-XII intact Bilateral EAC clear and TM intact with well pneumatized middle ear spaces except for small air fluid level (serous) on right Weber 512: midline Rinne 512: AC > BC b/l  Rine 1024: AC > BC b/l  Anterior rhinoscopy:  Septum deviates right; bilateral inferior turbinates with modest hypertrophy No lesions of oral cavity/oropharynx No obviously palpable neck masses/lymphadenopathy/thyromegaly No respiratory distress or stridor  Independent Review of Additional Tests or Records:  Outside referral notes reviewed and summarized above; Labs reviewed and summarized above  Procedures:  None  Impression & Plans:  Champion Phelan is a 62 y.o. male referred for evaluation of bilateral hearing loss and ear discomfor after a URI 4 weeks ago. He never had sudden hearing loss, just muffled hearing. Symptoms improving on left, not much on right. Small air fluid on right but some air posteriorly; on left, ME appears aerated.  He also has some sinonasal congestion  and AR symptoms which are helped with astelin. As such, will also treat him with a sinonasal regimen in addition to addressing his otologic problems.   Right otitis media with effusion Bilateral eustachian tube dysfunction - Given severity of symptoms, discussed short PO steroid course which he wishes for. Will prescribe medrol dose pack (4mg  tablets take as instructed for 6 day taper) - Flonase 2 puffs BID - audio in 6-8 weeks Post nasal drip Allergic rhinitis - Lloyd Huger med sinus rinses -- Flonase 2 puffs BID - Astelin as noted in med rec - continue - consider allergy workup if persistent symptoms (h/o sinus surgery  - f/u after audio in 8 weeks  MDM:  Level 4: 99204 Complexity/Problems addressed: low Data complexity: mod - Morbidity: mod  - Prescription Drug prescribed or managed: yes - steroids     Thank you for allowing me the opportunity to care for your patient. Please do not hesitate to contact me should you have any other questions.  Sincerely, Jovita Kussmaul, MD Otolarynoglogist (ENT), [facility] Phone: [phone] Fax: [phone]  01/27/2023, 8:53 AM

## 2023-02-07 ENCOUNTER — Ambulatory Visit (HOSPITAL_COMMUNITY): Payer: Medicare PPO | Admitting: Clinical

## 2023-02-07 ENCOUNTER — Encounter (HOSPITAL_COMMUNITY): Payer: Self-pay | Admitting: Clinical

## 2023-02-21 ENCOUNTER — Ambulatory Visit (INDEPENDENT_AMBULATORY_CARE_PROVIDER_SITE_OTHER): Payer: Medicare PPO | Admitting: Adult Health

## 2023-02-21 ENCOUNTER — Encounter: Payer: Self-pay | Admitting: Adult Health

## 2023-02-21 VITALS — BP 118/70 | HR 72 | Temp 97.1°F | Ht 73.0 in | Wt 174.0 lb

## 2023-02-21 DIAGNOSIS — F3181 Bipolar II disorder: Secondary | ICD-10-CM | POA: Diagnosis not present

## 2023-02-21 DIAGNOSIS — E118 Type 2 diabetes mellitus with unspecified complications: Secondary | ICD-10-CM

## 2023-02-21 DIAGNOSIS — J4530 Mild persistent asthma, uncomplicated: Secondary | ICD-10-CM

## 2023-02-21 DIAGNOSIS — F5101 Primary insomnia: Secondary | ICD-10-CM

## 2023-02-21 DIAGNOSIS — E785 Hyperlipidemia, unspecified: Secondary | ICD-10-CM

## 2023-02-21 DIAGNOSIS — F411 Generalized anxiety disorder: Secondary | ICD-10-CM

## 2023-02-21 DIAGNOSIS — I10 Essential (primary) hypertension: Secondary | ICD-10-CM | POA: Diagnosis not present

## 2023-02-21 MED ORDER — ALBUTEROL SULFATE HFA 108 (90 BASE) MCG/ACT IN AERS: 1.0000 | INHALATION_SPRAY | Freq: Four times a day (QID) | RESPIRATORY_TRACT | 3 refills | Status: DC | PRN

## 2023-02-21 MED ORDER — TRAZODONE HCL 100 MG PO TABS
100.0000 mg | ORAL_TABLET | Freq: Every day | ORAL | 1 refills | Status: DC
Start: 2023-02-21 — End: 2023-08-03

## 2023-02-21 NOTE — Progress Notes (Signed)
[facility] clinic  Provider:  Kenard Gower DNP  Code Status:  Full Code  Goals of Care:     02/21/2023    9:03 AM  Advanced Directives  Does Patient Have a Medical Advance Directive? No  Would patient like information on creating a medical advance directive? No - Patient declined     Chief Complaint  Patient presents with   Follow-up    1 month f/u. Discuss why patient is on 2 inhalers.Discuss trazodone dosing, patient concerned about co-pay.    Quality Metric Gaps    Discuss need for medicare Annual Wellness Visit    HPI: Patient is a 62 y.o. male seen today for medical management of chronic issues. He stated that he had COVID-19 infection 3 days after COVID-19 vaccination.  Essential hypertension  -  BP 118/70, takes amlodipine, losartan and metoprolol tartrate  Type 2 diabetes mellitus with complication, without long-term current use of insulin (HCC) -A1c 6.1, 01/13/2023, CBG this morning 127, takes metformin  GAD (generalized anxiety disorder) -  "anxiety level 6-7/10", takes Klonopin and Lexapro  Bipolar 2 disorder, major depressive episode (HCC) -  "OK', takes lithium  Mild persistent asthma without complication - " today is the best day since August", takes Flonase, Flovent and albuterol HFA as needed    Primary insomnia -sleeps 5 to 6 hours/night, takes trazodone    Wt Readings from Last 3 Encounters:  02/21/23 174 lb (78.9 kg)  01/27/23 177 lb (80.3 kg)  01/13/23 177 lb 3.2 oz (80.4 kg)     Past Medical History:  Diagnosis Date   Anxiety    Asthma    COPD (chronic obstructive pulmonary disease) (HCC)    Depression    Diabetes mellitus, type II (HCC)    GERD (gastroesophageal reflux disease)    Gout    HTN (hypertension)    IBS (irritable bowel syndrome)    Palpitations    Peripheral artery disease (HCC)     Past Surgical History:  Procedure Laterality Date   APPENDECTOMY  1977   FUNDOPLASTY TRANSTHORACIC  2008   HERNIA REPAIR  2014    RECONSTRUCTION OF NOSE  1978    Allergies  Allergen Reactions   Bee Pollen    Milk-Related Compounds Diarrhea   Penicillins Rash    Outpatient Encounter Medications as of 02/21/2023  Medication Sig   albuterol (PROAIR HFA) 108 (90 Base) MCG/ACT inhaler Inhale 1-2 puffs into the lungs every 6 (six) hours as needed for wheezing or shortness of breath.   amLODipine (NORVASC) 5 MG tablet Take 5 mg by mouth daily.   atorvastatin (LIPITOR) 20 MG tablet Take 1 tablet (20 mg total) by mouth daily.   clonazePAM (KLONOPIN) 1 MG disintegrating tablet Take 1 tablet (1 mg total) by mouth 2 (two) times daily as needed.   escitalopram (LEXAPRO) 20 MG tablet Take 1 tablet (20 mg total) by mouth daily.   Fish Oil-Cholecalciferol (FISH OIL + D3 PO) Take by mouth.   fluticasone (FLONASE) 50 MCG/ACT nasal spray Place 2 sprays into both nostrils daily.   fluticasone (FLOVENT HFA) 110 MCG/ACT inhaler Inhale 2 puffs into the lungs 2 (two) times daily.   gabapentin (NEURONTIN) 100 MG capsule Take 2 capsules (200 mg total) by mouth at bedtime.   glucose blood test strip Use to test blood sugars 2 times daily.   indomethacin (INDOCIN) 25 MG capsule Take 1 capsule (25 mg total) by mouth 3 (three) times daily as needed.   lithium carbonate 300  MG capsule Take 2 capsules (600 mg total) by mouth daily with breakfast AND 3 capsules (900 mg total) daily after supper.   losartan (COZAAR) 25 MG tablet Take 1 tablet (25 mg total) by mouth daily.   metFORMIN (GLUCOPHAGE) 500 MG tablet Take 1 tablet (500 mg total) by mouth 2 (two) times daily with a meal.   metoprolol tartrate (LOPRESSOR) 100 MG tablet Take 100 mg by mouth daily.   pantoprazole (PROTONIX) 40 MG tablet Take 40 mg by mouth daily.   trazodone (DESYREL) 300 MG tablet Take 1 tablet (300 mg total) by mouth at bedtime.   TRUEplus Lancets 30G MISC Use to test blood sugars 2 times daily.   BREO ELLIPTA 200-25 MCG/ACT AEPB Inhale 1 puff into the lungs daily.  (Patient not taking: Reported on 02/21/2023)   [DISCONTINUED] methylPREDNISolone (MEDROL DOSEPAK) 4 MG TBPK tablet 1 pack; take as prescribed   [DISCONTINUED] metoprolol tartrate (LOPRESSOR) 100 MG tablet Take 1 tablet 2 hours prior to CT Scan   No facility-administered encounter medications on file as of 02/21/2023.    Review of Systems:  Review of Systems  Constitutional:  Negative for activity change, appetite change and fever.  HENT:  Negative for sore throat.   Eyes: Negative.   Cardiovascular:  Negative for chest pain and leg swelling.  Gastrointestinal:  Negative for abdominal distention, diarrhea and vomiting.  Genitourinary:  Negative for dysuria, frequency and urgency.  Skin:  Negative for color change.  Neurological:  Negative for dizziness and headaches.  Psychiatric/Behavioral:  Negative for behavioral problems and sleep disturbance. The patient is nervous/anxious.     Health Maintenance  Topic Date Due   OPHTHALMOLOGY EXAM  08/02/2018   Medicare Annual Wellness (AWV)  02/28/2020   COVID-19 Vaccine (8 - 2023-24 season) 03/25/2023   HEMOGLOBIN A1C  07/13/2023   FOOT EXAM  12/02/2023   Diabetic kidney evaluation - eGFR measurement  01/13/2024   Diabetic kidney evaluation - Urine ACR  01/13/2024   Colonoscopy  05/04/2027   DTaP/Tdap/Td (4 - Td or Tdap) 11/14/2029   INFLUENZA VACCINE  Completed   Hepatitis C Screening  Completed   HIV Screening  Completed   Zoster Vaccines- Shingrix  Completed   HPV VACCINES  Aged Out    Physical Exam: Vitals:   02/21/23 0851  BP: 118/70  Pulse: 72  Temp: (!) 97.1 F (36.2 C)  TempSrc: Temporal  SpO2: 97%  Weight: 174 lb (78.9 kg)  Height: 6\' 1"  (1.854 m)   Body mass index is 22.96 kg/m. Physical Exam Constitutional:      General: He is not in acute distress.    Appearance: Normal appearance.  HENT:     Head: Normocephalic and atraumatic.     Mouth/Throat:     Mouth: Mucous membranes are moist.  Eyes:      Conjunctiva/sclera: Conjunctivae normal.  Cardiovascular:     Rate and Rhythm: Normal rate and regular rhythm.     Pulses: Normal pulses.     Heart sounds: Normal heart sounds.  Pulmonary:     Effort: Pulmonary effort is normal.     Breath sounds: Normal breath sounds.  Abdominal:     General: Bowel sounds are normal.     Palpations: Abdomen is soft.  Musculoskeletal:        General: No swelling. Normal range of motion.     Cervical back: Normal range of motion.  Skin:    General: Skin is warm and dry.  Neurological:  General: No focal deficit present.     Mental Status: He is alert and oriented to person, place, and time.  Psychiatric:        Mood and Affect: Mood normal.        Behavior: Behavior normal.        Thought Content: Thought content normal.        Judgment: Judgment normal.     Labs reviewed: Basic Metabolic Panel: Recent Labs    07/08/22 1329 10/20/22 1022 01/13/23 1059  NA 142 140 139  K 4.6 4.6 4.6  CL 107 106 106  CO2 24 27 25   GLUCOSE 100* 102* 102  BUN 17 14 15   CREATININE 0.95 0.79 0.77  CALCIUM 10.0 9.8 9.6   Liver Function Tests: Recent Labs    07/08/22 1329 01/13/23 1059  AST 13 15  ALT 24 26  BILITOT 2.5* 1.7*  PROT 7.2 6.8   No results for input(s): "LIPASE", "AMYLASE" in the last 8760 hours. No results for input(s): "AMMONIA" in the last 8760 hours. CBC: Recent Labs    07/08/22 1329 10/20/22 1022 01/13/23 1059  WBC 6.9 5.1 7.2  NEUTROABS 4,326 3,035 4,630  HGB 15.3 15.1 14.0  HCT 44.9 44.7 42.1  MCV 88.4 87.1 87.2  PLT 307 272 304   Lipid Panel: Recent Labs    07/08/22 1329  CHOL 111  HDL 39*  LDLCALC 39  TRIG 161*  CHOLHDL 2.8   Lab Results  Component Value Date   HGBA1C 6.1 (H) 01/13/2023    Procedures since last visit: No results found.  Assessment/Plan  1. Essential hypertension -   BP stable -   Continue current medications  2. Type 2 diabetes mellitus with complication, without long-term  current use of insulin (HCC) Lab Results  Component Value Date   HGBA1C 6.1 (H) 01/13/2023   -  stable -  Continue metformin  3. GAD (generalized anxiety disorder) -   Continue Klonopin and Lexapro -   Follows-up with psychiatry  4. Bipolar 2 disorder, major depressive episode (HCC) -   Continue lithium  5. Mild persistent asthma without complication -  needs refill of albuterol HFA as needed -   Currently uses Symbicort inhalation, once supply questionable patient will change to Breo Ellipta -   Continue Flonase and fluticasone (Flovent) - albuterol (PROAIR HFA) 108 (90 Base) MCG/ACT inhaler; Inhale 1-2 puffs into the lungs every 6 (six) hours as needed for wheezing or shortness of breath.  Dispense: 1 each; Refill: 3  6. Hyperlipidemia LDL goal <100 Lab Results  Component Value Date   CHOL 111 07/08/2022   HDL 39 (L) 07/08/2022   LDLCALC 39 07/08/2022   TRIG 314 (H) 07/08/2022   CHOLHDL 2.8 07/08/2022    -   Continue atorvastatin  7. Primary insomnia -   Sleep 6 to 7 hours/night -   Continue trazodone - traZODone (DESYREL) 100 MG tablet; Take 1 tablet (100 mg total) by mouth at bedtime.  Dispense: 90 tablet; Refill: 1     Labs/tests ordered:  None  Next appt:  03/21/2023

## 2023-02-23 ENCOUNTER — Ambulatory Visit (HOSPITAL_BASED_OUTPATIENT_CLINIC_OR_DEPARTMENT_OTHER): Payer: Medicare PPO | Admitting: Student

## 2023-02-23 DIAGNOSIS — F4310 Post-traumatic stress disorder, unspecified: Secondary | ICD-10-CM

## 2023-02-23 DIAGNOSIS — F5105 Insomnia due to other mental disorder: Secondary | ICD-10-CM | POA: Diagnosis not present

## 2023-02-23 DIAGNOSIS — F3181 Bipolar II disorder: Secondary | ICD-10-CM | POA: Diagnosis not present

## 2023-02-23 DIAGNOSIS — F424 Excoriation (skin-picking) disorder: Secondary | ICD-10-CM

## 2023-02-23 DIAGNOSIS — F411 Generalized anxiety disorder: Secondary | ICD-10-CM
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
## 2023-02-24 ENCOUNTER — Encounter (HOSPITAL_COMMUNITY): Payer: Self-pay | Admitting: Student

## 2023-02-24 ENCOUNTER — Encounter: Payer: Self-pay | Admitting: Adult Health

## 2023-02-24 MED ORDER — CLONAZEPAM 0.5 MG PO TBDP
0.5000 mg | ORAL_TABLET | Freq: Two times a day (BID) | ORAL | 0 refills | Status: DC | PRN
Start: 2023-03-13 — End: 2023-04-06

## 2023-02-24 MED ORDER — ESCITALOPRAM OXALATE 20 MG PO TABS: 20.0000 mg | ORAL_TABLET | Freq: Every day | ORAL | 0 refills | Status: DC

## 2023-02-28 NOTE — Addendum Note (Signed)
Addended by: Everlena Cooper on: 02/28/2023 08:20 AM   Modules accepted: Level of Service

## 2023-03-03 ENCOUNTER — Ambulatory Visit: Payer: Medicare PPO | Attending: Otolaryngology | Admitting: Audiologist

## 2023-03-03 DIAGNOSIS — H9193 Unspecified hearing loss, bilateral: Secondary | ICD-10-CM | POA: Insufficient documentation

## 2023-03-03 NOTE — Procedures (Signed)
  Outpatient Audiology and [facility] [address] [place], [place]  [address] [phone]  AUDIOLOGICAL  EVALUATION  NAME: Matthew Ortiz.     DOB:   1960/06/07      MRN: [id number]                                                                                     DATE: 03/03/2023     REFERENT: Gillis Santa, NP STATUS: Outpatient DIAGNOSIS: History of Otitis Media, Decreased Hearing    History: Matthew Ortiz was seen for an audiological evaluation due to muffled hearing after bilateral ear infections. In August [name] had bilateral ear infections after flying. He could not hear at all from the right ear and a little from the left. He saw Otolaryngologist Dr. Allena Katz who prescribed a course of steriods. The ear infections have slowly resolved. He feels he hears better now. He has history of noise exposure from working around [employer]. He denies any pain, pressure, or bothersome tinnitus today.   Evaluation:  Otoscopy showed a clear view of the tympanic membranes, bilaterally Tympanometry results were consistent with normal middle ear function in left ear and negative pressure in right ear (type C) Audiometric testing was completed using Conventional Audiometry techniques with insert earphones and TDH headphones. Test results are consistent with normal to slight hearing loss at [facility] only. Speech Recognition Thresholds were obtained at  20dB HL in the right ear and at 15dB HL in the left ear. Word Recognition Testing was completed at 40dB SL and Matthew Ortiz scored 100% in each ear.    Results:  The test results were reviewed with Matthew Ortiz. He has essentially normal hearing. There are no indications of infection or fluid in the middle ear. No need for follow up.    Recommendations: 1.   No further testing is recommended at this time. If new or worsening hearing difficulties are observed further audiological testing is recommended.          21 minutes spent testing and  counseling on results.   If you have any questions please feel free to contact me at [phone].  Ammie Ferrier Au.D.  Audiologist   03/03/2023  2:24 PM  Cc: Medina-Vargas, Margit Banda, NP

## 2023-03-07 ENCOUNTER — Ambulatory Visit (INDEPENDENT_AMBULATORY_CARE_PROVIDER_SITE_OTHER): Payer: Medicare PPO

## 2023-03-07 ENCOUNTER — Ambulatory Visit (INDEPENDENT_AMBULATORY_CARE_PROVIDER_SITE_OTHER): Payer: Medicare PPO | Admitting: Otolaryngology

## 2023-03-21 ENCOUNTER — Ambulatory Visit (INDEPENDENT_AMBULATORY_CARE_PROVIDER_SITE_OTHER): Payer: Medicare PPO | Admitting: Adult Health

## 2023-03-21 ENCOUNTER — Encounter: Payer: Self-pay | Admitting: Adult Health

## 2023-03-21 VITALS — BP 127/78 | HR 61 | Temp 98.2°F | Resp 18 | Ht 73.0 in | Wt 173.0 lb

## 2023-03-21 DIAGNOSIS — Z0000 Encounter for general adult medical examination without abnormal findings: Secondary | ICD-10-CM

## 2023-03-21 NOTE — Progress Notes (Signed)
Subjective:   Matthew Ortiz. is a 62 y.o. male who presents for Medicare Annual/Subsequent preventive examination.  Visit Complete: In person  Patient Medicare AWV questionnaire was completed by the patient on 03/21/23; I have confirmed that all information answered by patient is correct and no changes since this date.  Cardiac Risk Factors include: advanced age (>72men, >65 women);diabetes mellitus;dyslipidemia;hypertension;male gender     Objective:    Today's Vitals   03/21/23 1004  BP: 127/78  Pulse: 61  Resp: 18  Temp: 98.2 F (36.8 C)  SpO2: 97%  Weight: 173 lb (78.5 kg)  Height: 6\' 1"  (1.854 m)   Body mass index is 22.82 kg/m.     02/21/2023    9:03 AM 01/13/2023    9:58 AM 12/02/2022    9:45 AM 10/20/2022    8:29 AM 07/08/2022   12:45 PM 07/07/2022    4:49 PM 02/28/2019   10:06 AM  Advanced Directives  Does Patient Have a Medical Advance Directive? No No No No No No No  Would patient like information on creating a medical advance directive? No - Patient declined No - Patient declined No - Patient declined No - Patient declined  No - Patient declined No - Patient declined    Current Medications (verified) Outpatient Encounter Medications as of 03/21/2023  Medication Sig   albuterol (PROAIR HFA) 108 (90 Base) MCG/ACT inhaler Inhale 1-2 puffs into the lungs every 6 (six) hours as needed for wheezing or shortness of breath.   amLODipine (NORVASC) 5 MG tablet Take 5 mg by mouth daily.   atorvastatin (LIPITOR) 20 MG tablet Take 1 tablet (20 mg total) by mouth daily.   BREO ELLIPTA 200-25 MCG/ACT AEPB Inhale 1 puff into the lungs daily.   clonazePAM (KLONOPIN) 0.5 MG disintegrating tablet Take 1 tablet (0.5 mg total) by mouth 2 (two) times daily as needed.   escitalopram (LEXAPRO) 20 MG tablet Take 1 tablet (20 mg total) by mouth daily.   Fish Oil-Cholecalciferol (FISH OIL + D3 PO) Take by mouth.   fluticasone (FLONASE) 50 MCG/ACT nasal spray Place 2 sprays into both  nostrils daily.   fluticasone (FLOVENT HFA) 110 MCG/ACT inhaler Inhale 2 puffs into the lungs 2 (two) times daily.   gabapentin (NEURONTIN) 100 MG capsule Take 2 capsules (200 mg total) by mouth at bedtime.   glucose blood test strip Use to test blood sugars 2 times daily.   indomethacin (INDOCIN) 25 MG capsule Take 1 capsule (25 mg total) by mouth 3 (three) times daily as needed.   lithium carbonate 300 MG capsule Take 2 capsules (600 mg total) by mouth daily with breakfast AND 3 capsules (900 mg total) daily after supper.   losartan (COZAAR) 25 MG tablet Take 1 tablet (25 mg total) by mouth daily.   metFORMIN (GLUCOPHAGE) 500 MG tablet Take 1 tablet (500 mg total) by mouth 2 (two) times daily with a meal.   metoprolol tartrate (LOPRESSOR) 100 MG tablet Take 100 mg by mouth daily.   pantoprazole (PROTONIX) 40 MG tablet Take 40 mg by mouth daily.   traZODone (DESYREL) 100 MG tablet Take 1 tablet (100 mg total) by mouth at bedtime.   TRUEplus Lancets 30G MISC Use to test blood sugars 2 times daily.   No facility-administered encounter medications on file as of 03/21/2023.    Allergies (verified) Bee pollen, Milk-related compounds, and Penicillins   History: Past Medical History:  Diagnosis Date   Anxiety    Asthma  COPD (chronic obstructive pulmonary disease) (HCC)    Depression    Diabetes mellitus, type II (HCC)    GERD (gastroesophageal reflux disease)    Gout    HTN (hypertension)    IBS (irritable bowel syndrome)    Major depressive disorder in partial remission (HCC) 08/30/2017   Mixed bipolar I disorder in remission (HCC) 08/09/2022   Palpitations    Peripheral artery disease (HCC)    Past Surgical History:  Procedure Laterality Date   APPENDECTOMY  1977   FUNDOPLASTY TRANSTHORACIC  2008   HERNIA REPAIR  2014   RECONSTRUCTION OF NOSE  1978   Family History  Problem Relation Age of Onset   Depression Mother    Anxiety disorder Mother    Schizophrenia Father     Suicidality Father    Suicidality Paternal Grandfather    Drug abuse Paternal Grandfather    Alcohol abuse Paternal Grandfather    Social History   Socioeconomic History   Marital status: Married    Spouse name: Not on file   Number of children: Not on file   Years of education: Not on file   Highest education level: Not on file  Occupational History   Not on file  Tobacco Use   Smoking status: Former    Current packs/day: 0.00    Average packs/day: 1.5 packs/day for 20.0 years (30.0 ttl pk-yrs)    Types: Cigarettes    Start date: 05/03/1978    Quit date: 05/03/1998    Years since quitting: 24.8   Smokeless tobacco: Never  Vaping Use   Vaping status: Never Used  Substance and Sexual Activity   Alcohol use: Yes    Comment: socially-small amount of wine monthly - 1 glass    Drug use: No    Types: Marijuana    Comment: last use in 2018   Sexual activity: Not Currently    Partners: Female  Other Topics Concern   Not on file  Social History Narrative   Not on file   Social Determinants of Health   Financial Resource Strain: Not on file  Food Insecurity: Not on file  Transportation Needs: Not on file  Physical Activity: Not on file  Stress: Not on file  Social Connections: Not on file    Tobacco Counseling Counseling given: Not Answered   Clinical Intake:  Pre-visit preparation completed: No  Pain : No/denies pain     BMI - recorded: 22.96 Nutritional Status: BMI of 19-24  Normal Nutritional Risks: None Diabetes: Yes CBG done?: Yes (158) CBG resulted in Enter/ Edit results?: No Did pt. bring in CBG monitor from home?: No  How often do you need to have someone help you when you read instructions, pamphlets, or other written materials from your doctor or pharmacy?: 2 - Rarely What is the last grade level you completed in school?: Bachelor's Degree  Interpreter Needed?: No  Information entered by :: Maysen Bonsignore Medina-Vargas DNP   Activities of Daily  Living    03/21/2023   10:31 AM 01/10/2023    9:06 PM  In your present state of health, do you have any difficulty performing the following activities:  Hearing? 0 0  Vision? 0 0  Difficulty concentrating or making decisions? 1 0  Walking or climbing stairs? 0 0  Dressing or bathing? 0 0  Doing errands, shopping? 0 0  Preparing Food and eating ? N N  Using the Toilet? N N  In the past six months, have you accidently leaked  urine? N Y  Do you have problems with loss of bowel control? N N  Managing your Medications? N N  Managing your Finances? N N  Housekeeping or managing your Housekeeping? N N    Patient Care Team: Medina-Vargas, Margit Banda, NP as PCP - General (Internal Medicine) Mallipeddi, Orion Modest, MD as PCP - Cardiology (Cardiology)  Indicate any recent Medical Services you may have received from other than [facility] providers in the past year (date may be approximate).     Assessment:   This is a routine wellness examination for [place].  Hearing/Vision screen No results found.   Goals Addressed             This Visit's Progress    Exercise 3x per week (30 min per time)       -  exercise 4-5 times/week, brisk walk     Exercise 3x per week (30 min per time)       Will do brisk walking      Depression Screen    03/21/2023   10:02 AM 02/21/2023    9:02 AM 01/13/2023    3:33 PM 01/13/2023    9:57 AM 12/02/2022    9:44 AM 10/20/2022    9:57 AM 10/07/2022   10:55 AM  PHQ 2/9 Scores  PHQ - 2 Score 0 0 5 3 3 4 6   PHQ- 9 Score   18  16 16 18     Fall Risk    03/21/2023   10:02 AM 02/21/2023    9:02 AM 01/13/2023    9:57 AM 01/10/2023    9:06 PM 12/02/2022    9:44 AM  Fall Risk   Falls in the past year? 0 0 0 0 0  Number falls in past yr: 0 0 0  0  Injury with Fall? 0 0 0 0 0  Risk for fall due to : No Fall Risks No Fall Risks No Fall Risks  No Fall Risks  Follow up Falls evaluation completed Falls evaluation completed Falls evaluation completed  Falls evaluation  completed    MEDICARE RISK AT HOME: Medicare Risk at Home Any stairs in or around the home?: Yes If so, are there any without handrails?: No Home free of loose throw rugs in walkways, pet beds, electrical cords, etc?: Yes Adequate lighting in your home to reduce risk of falls?: Yes Life alert?: No Use of a cane, walker or w/c?: No Grab bars in the bathroom?: Yes Shower chair or bench in shower?: Yes Elevated toilet seat or a handicapped toilet?: No  TIMED UP AND GO:  Was the test performed?  Yes  Length of time to ambulate 10 feet: <10 sec Gait steady and fast without use of assistive device    Cognitive Function:    03/21/2023   10:03 AM  MMSE - Mini Mental State Exam  Orientation to time 5  Orientation to Place 5  Registration 3  Attention/ Calculation 5  Recall 3  Language- name 2 objects 2  Language- repeat 1  Language- follow 3 step command 3  Language- read & follow direction 1  Write a sentence 1  Copy design 1  Total score 30        Immunizations Immunization History  Administered Date(s) Administered   Hepatitis A, Adult 10/04/1995   Hepatitis B, ADULT 05/04/1999, 02/20/2020, 06/19/2020   Hepatitis B, PED/ADOLESCENT 05/04/1999   Influenza Inj Mdck Quad Pf 01/05/2022   Influenza Split 02/01/2011, 01/31/2017   Influenza, Seasonal, Injecte,  Preservative Fre 01/31/2013, 03/01/2014, 02/26/2015, 02/11/2016, 01/01/2021, 01/28/2023   Influenza,inj,Quad PF,6+ Mos 12/29/2018   Janssen (J&J) SARS-COV-2 Vaccination 07/09/2019, 07/16/2019   Moderna Covid-19 Fall Seasonal Vaccine 75yrs & older 01/28/2023   Moderna Covid-19 Vaccine Bivalent Booster 51yrs & up 03/06/2021   Moderna Sars-Covid-2 Vaccination 03/18/2020, 10/20/2020   Pfizer(Comirnaty)Fall Seasonal Vaccine 12 years and older 02/03/2022   Pneumococcal Polysaccharide-23 07/12/2017   Tdap 10/01/2009, 03/08/2019, 11/15/2019   Typhoid Live 10/04/1995   Zoster Recombinant(Shingrix) 03/03/2017, 05/17/2017,  06/03/2017, 06/23/2017, 07/15/2017, 08/22/2017    TDAP status: Up to date  Flu Vaccine status: Up to date  Pneumococcal vaccine status: Up to date  Covid-19 vaccine status: Information provided on how to obtain vaccines.   Qualifies for Shingles Vaccine? Yes   Zostavax completed Yes   Shingrix Completed?: Yes  Screening Tests Health Maintenance  Topic Date Due   COVID-19 Vaccine (8 - 2023-24 season) 03/25/2023   OPHTHALMOLOGY EXAM  07/02/2023   HEMOGLOBIN A1C  07/13/2023   FOOT EXAM  12/02/2023   Diabetic kidney evaluation - eGFR measurement  01/13/2024   Diabetic kidney evaluation - Urine ACR  01/13/2024   Medicare Annual Wellness (AWV)  03/20/2024   Colonoscopy  05/04/2027   DTaP/Tdap/Td (4 - Td or Tdap) 11/14/2029   INFLUENZA VACCINE  Completed   Hepatitis C Screening  Completed   HIV Screening  Completed   Zoster Vaccines- Shingrix  Completed   HPV VACCINES  Aged Out    Health Maintenance  Health Maintenance Due  Topic Date Due   COVID-19 Vaccine (8 - 2023-24 season) 03/25/2023    Colorectal cancer screening: Type of screening: Colonoscopy. Completed 2 years ago. Repeat every 5 years  Lung Cancer Screening: (Low Dose CT Chest recommended if Age 24-80 years, 20 pack-year currently smoking OR have quit w/in 15years.) does not qualify.   Lung Cancer Screening Referral:  No  Additional Screening:  Hepatitis C Screening: does qualify; Completed  12/02/22  Vision Screening: Recommended annual ophthalmology exams for early detection of glaucoma and other disorders of the eye. Is the patient up to date with their annual eye exam?  Yes  Who is the provider or what is the name of the office in which the patient attends annual eye exams? [facility] If pt is not established with a provider, would they like to be referred to a provider to establish care? No .   Dental Screening: Recommended annual dental exams for proper oral hygiene  Diabetic Foot Exam: Diabetic  Foot Exam: Completed    Community Resource Referral / Chronic Care Management: CRR required this visit?  Yes   CCM required this visit?  No     Plan:     I have personally reviewed and noted the following in the patient's chart:   Medical and social history Use of alcohol, tobacco or illicit drugs  Current medications and supplements including opioid prescriptions. Patient is not currently taking opioid prescriptions. Functional ability and status Nutritional status Physical activity Advanced directives List of other physicians Hospitalizations, surgeries, and ER visits in previous 12 months Vitals Screenings to include cognitive, depression, and falls Referrals and appointments  In addition, I have reviewed and discussed with patient certain preventive protocols, quality metrics, and best practice recommendations. A written personalized care plan for preventive services as well as general preventive health recommendations were provided to patient.     Mary-Anne Polizzi Medina-Vargas, NP   03/21/2023   After Visit Summary: (In Person-Printed) AVS printed and given to the  patient  Nurse Notes: need to be done annually.

## 2023-03-21 NOTE — Patient Instructions (Signed)
  [name] , Thank you for taking time to come for your Medicare Wellness Visit. I appreciate your ongoing commitment to your health goals. Please review the following plan we discussed and let me know if I can assist you in the future.   These are the goals we discussed:  Goals       Chronic Care Management (pt-stated)      CARE PLAN ENTRY  Current Barriers:  Chronic Disease Management support, education, and care coordination needs related to hypertension, Asthma, heartburn, Type 2 Diabetes, Osteoarthritis, BPH, anxiety, depression, Bipolar disorder, hyperlipidemia, and insomnia     Pharmacist Clinical Goal(s):  A1c goal <6.5% Blood Pressure goal <140/90 LDL (bad) cholesterol <100   Interventions: Comprehensive medication review performed. Recommend resuming Flonase if allergy symptoms worsen. Will request refill be sent into patient pharmacy.  Patient Self Care Activities:  Self administers medications as prescribed Recommend decreasing glucose monitoring to once daily. Can occasionally check in evenings as well.  Initial goal documentation       Exercise 3x per week (30 min per time)      -  exercise 4-5 times/week, brisk walk      Exercise 3x per week (30 min per time)      Will do brisk walking      Patient Stated      Lose weight by cutting out carbs in the evening.        This is a list of the screening recommended for you and due dates:  Health Maintenance  Topic Date Due   COVID-19 Vaccine (8 - 2023-24 season) 03/25/2023   Eye exam for diabetics  07/02/2023   Hemoglobin A1C  07/13/2023   Complete foot exam   12/02/2023   Yearly kidney function blood test for diabetes  01/13/2024   Yearly kidney health urinalysis for diabetes  01/13/2024   Medicare Annual Wellness Visit  03/20/2024   Colon Cancer Screening  05/04/2027   DTaP/Tdap/Td vaccine (4 - Td or Tdap) 11/14/2029   Flu Shot  Completed   Hepatitis C Screening  Completed   HIV Screening  Completed    Zoster (Shingles) Vaccine  Completed   HPV Vaccine  Aged Out

## 2023-04-06 ENCOUNTER — Telehealth (HOSPITAL_BASED_OUTPATIENT_CLINIC_OR_DEPARTMENT_OTHER): Payer: Medicare PPO | Admitting: Student

## 2023-04-06 DIAGNOSIS — F3181 Bipolar II disorder: Secondary | ICD-10-CM

## 2023-04-06 DIAGNOSIS — F411 Generalized anxiety disorder: Secondary | ICD-10-CM | POA: Diagnosis not present

## 2023-04-06 DIAGNOSIS — F4310 Post-traumatic stress disorder, unspecified: Secondary | ICD-10-CM

## 2023-04-06 MED ORDER — DIAZEPAM 5 MG PO TABS: 5.0000 mg | ORAL_TABLET | Freq: Two times a day (BID) | ORAL | 1 refills | Status: AC

## 2023-04-06 MED ORDER — ESCITALOPRAM OXALATE 5 MG PO TABS: 5.0000 mg | ORAL_TABLET | Freq: Every day | ORAL | 1 refills | Status: DC

## 2023-04-06 MED ORDER — LITHIUM CARBONATE 300 MG PO CAPS: ORAL_CAPSULE | ORAL | 0 refills | Status: DC

## 2023-04-06 MED ORDER — ESCITALOPRAM OXALATE 10 MG PO TABS: 10.0000 mg | ORAL_TABLET | Freq: Every day | ORAL | 1 refills | Status: DC
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
## 2023-04-11 ENCOUNTER — Encounter: Payer: Self-pay | Admitting: Adult Health

## 2023-04-11 ENCOUNTER — Telehealth (INDEPENDENT_AMBULATORY_CARE_PROVIDER_SITE_OTHER): Payer: Medicare PPO | Admitting: Adult Health

## 2023-04-11 DIAGNOSIS — J449 Chronic obstructive pulmonary disease, unspecified: Secondary | ICD-10-CM

## 2023-04-11 DIAGNOSIS — J209 Acute bronchitis, unspecified: Secondary | ICD-10-CM

## 2023-04-11 DIAGNOSIS — F3181 Bipolar II disorder: Secondary | ICD-10-CM

## 2023-04-11 MED ORDER — DOXYCYCLINE HYCLATE 100 MG PO TABS: 100.0000 mg | ORAL_TABLET | Freq: Two times a day (BID) | ORAL | 0 refills | Status: AC

## 2023-04-11 MED ORDER — BENZONATATE 100 MG PO CAPS: 100.0000 mg | ORAL_CAPSULE | Freq: Three times a day (TID) | ORAL | 0 refills | Status: DC | PRN

## 2023-04-11 NOTE — Addendum Note (Signed)
Addended by: Everlena Cooper on: 04/11/2023 08:26 AM   Modules accepted: Level of Service

## 2023-04-11 NOTE — Progress Notes (Signed)
This service is provided via telemedicine  No vital signs collected/recorded due to the encounter was a telemedicine visit.   Location of patient (ex: home, work):  Home   Patient consents to a telephone visit:  Yes, 04/11/23   Location of the provider (ex: office, home):  [facility]  Name of any referring provider:  Medina-Vargas, Margit Banda, NP   Names of all persons participating in the telemedicine service and their role in the encounter: Keontae Levingston B/CMA, Medina-Vargas, Monina C, NP , and patient  Time spent on call:  11 minutes

## 2023-04-11 NOTE — Progress Notes (Signed)
I connected with  Matthew Ortiz. on 04/11/23 by a video enabled telemedicine application and verified that I am speaking with the correct person using two identifiers.   I discussed the limitations of evaluation and management by telemedicine. The patient expressed understanding and agreed to proceed.        DATE:  04/11/2023 MRN:  [id number]  BIRTHDAY: May 27, 1960   Contact Information     Name Relation Home Work Mobile   [place] Spouse [phone]  [phone]      Other Contacts     Name Relation Home Work [place] Clearence Cheek [phone]           Chief Complaint  Patient presents with   Nasal Congestion     Congestion, runny nose, sore throat     HISTORY OF PRESENT ILLNESS: He is a 62 year old male who had a video visit today.  - For 5-6 days congestion, bad cough and sore throat  "feels crap" - Has brownish phlegm - has nasal drainage,yellow or clear -  feels like he has fever, no chills -  negative for COVID-19 test at home -  has slight body aches -   has "rattling on chest" -  has wheezing sometimes  Takes Breo Ellipta, albuterol HFA PRN and Fluticasone inhaler for COPD  -  PHQ-9 score 18, ranging as moderate depression - Takes lithium, lexapro, valium and Trazodone -  follows up with psychiatry  PAST MEDICAL HISTORY:  Past Medical History:  Diagnosis Date   Anxiety    Asthma    COPD (chronic obstructive pulmonary disease) (HCC)    Depression    Diabetes mellitus, type II (HCC)    GERD (gastroesophageal reflux disease)    Gout    HTN (hypertension)    IBS (irritable bowel syndrome)    Major depressive disorder in partial remission (HCC) 08/30/2017   Mixed bipolar I disorder in remission (HCC) 08/09/2022   Palpitations    Peripheral artery disease (HCC)      CURRENT MEDICATIONS: Reviewed  Patient's Medications  New Prescriptions   No medications on file  Previous Medications    ALBUTEROL (PROAIR HFA) 108 (90 BASE) MCG/ACT INHALER    Inhale 1-2 puffs into the lungs every 6 (six) hours as needed for wheezing or shortness of breath.   AMLODIPINE (NORVASC) 5 MG TABLET    Take 5 mg by mouth daily.   ATORVASTATIN (LIPITOR) 20 MG TABLET    Take 1 tablet (20 mg total) by mouth daily.   BREO ELLIPTA 200-25 MCG/ACT AEPB    Inhale 1 puff into the lungs daily.   DIAZEPAM (VALIUM) 5 MG TABLET    Take 1 tablet (5 mg total) by mouth 2 (two) times daily.   ESCITALOPRAM (LEXAPRO) 10 MG TABLET    Take 1 tablet (10 mg total) by mouth daily. Take with 5 mg tablet for daily total of 15 mg.   ESCITALOPRAM (LEXAPRO) 5 MG TABLET    Take 1 tablet (5 mg total) by mouth daily. Take with 10 mg tablet for daily total of 15 mg.   FISH OIL-CHOLECALCIFEROL (FISH OIL + D3 PO)    Take by mouth.   FLUTICASONE (FLONASE) 50 MCG/ACT NASAL SPRAY    Place 2 sprays into both nostrils daily.   FLUTICASONE (FLOVENT HFA) 110 MCG/ACT INHALER    Inhale 2 puffs into the lungs 2 (two) times daily.   GABAPENTIN (NEURONTIN) 100 MG CAPSULE    Take 2 capsules (200 mg  total) by mouth at bedtime.   GLUCOSE BLOOD TEST STRIP    Use to test blood sugars 2 times daily.   INDOMETHACIN (INDOCIN) 25 MG CAPSULE    Take 1 capsule (25 mg total) by mouth 3 (three) times daily as needed.   LITHIUM CARBONATE 300 MG CAPSULE    Take 2 capsules (600 mg total) by mouth daily with breakfast AND 3 capsules (900 mg total) daily after supper.   LOSARTAN (COZAAR) 25 MG TABLET    Take 1 tablet (25 mg total) by mouth daily.   METFORMIN (GLUCOPHAGE) 500 MG TABLET    Take 1 tablet (500 mg total) by mouth 2 (two) times daily with a meal.   METOPROLOL TARTRATE (LOPRESSOR) 100 MG TABLET    Take 100 mg by mouth daily.   PANTOPRAZOLE (PROTONIX) 40 MG TABLET    Take 40 mg by mouth daily.   TRAZODONE (DESYREL) 100 MG TABLET    Take 1 tablet (100 mg total) by mouth at bedtime.   TRUEPLUS LANCETS 30G MISC    Use to test blood sugars 2 times daily.  Modified  Medications   No medications on file  Discontinued Medications   No medications on file     Allergies  Allergen Reactions   Bee Pollen    Milk-Related Compounds Diarrhea   Penicillins Rash     REVIEW OF SYSTEMS:  GENERAL: has 4-5 days fever SKIN: Denies rash, itching, wounds, ulcer sores, or nail abnormality EYES: Denies change in vision, dry eyes, eye pain, itching or discharge EARS: Denies change in hearing, ringing in ears, or earache NOSE: Denies nasal congestion or epistaxis MOUTH and THROAT: has sore throat x 3-4 days RESPIRATORY: productive cough X 5-6 days CARDIAC: no chest pain, edema or palpitations GI: no abdominal pain, diarrhea, constipation, heart burn, nausea or vomiting GU: Denies dysuria, frequency, hematuria, incontinence, or discharge MUSCULOSKELETAL: has body aches CIRCULATION: Denies claudication, edema of legs, varicosities, or cold extremities NEUROLOGICAL: Denies dizziness, syncope, numbness, or headache PSYCHIATRIC: feels depressed. Denies plans of hurting self    LABS/RADIOLOGY: Labs reviewed: Basic Metabolic Panel: Recent Labs    07/08/22 1329 10/20/22 1022 01/13/23 1059  NA 142 140 139  K 4.6 4.6 4.6  CL 107 106 106  CO2 24 27 25   GLUCOSE 100* 102* 102  BUN 17 14 15   CREATININE 0.95 0.79 0.77  CALCIUM 10.0 9.8 9.6   Liver Function Tests: Recent Labs    07/08/22 1329 01/13/23 1059  AST 13 15  ALT 24 26  BILITOT 2.5* 1.7*  PROT 7.2 6.8   No results for input(s): "LIPASE", "AMYLASE" in the last 8760 hours. No results for input(s): "AMMONIA" in the last 8760 hours. CBC: Recent Labs    07/08/22 1329 10/20/22 1022 01/13/23 1059  WBC 6.9 5.1 7.2  NEUTROABS 4,326 3,035 4,630  HGB 15.3 15.1 14.0  HCT 44.9 44.7 42.1  MCV 88.4 87.1 87.2  PLT 307 272 304   A1C: Invalid input(s): "A1C" Lipid Panel: Recent Labs    07/08/22 1329  HDL 39*   Cardiac Enzymes: No results for input(s): "CKTOTAL", "CKMB", "CKMBINDEX",  "TROPONINI" in the last 8760 hours. BNP: Invalid input(s): "POCBNP" CBG: No results for input(s): "GLUCAP" in the last 8760 hours.    No results found.  ASSESSMENT/PLAN:  1. Acute bronchitis, unspecified organism -  has fever, productive cough and body aches -  will start on Doxycycline and Tessalon Perles - doxycycline (VIBRA-TABS) 100 MG tablet; Take 1 tablet (100  mg total) by mouth 2 (two) times daily for 10 days.  Dispense: 20 tablet; Refill: 0 - benzonatate (TESSALON PERLES) 100 MG capsule; Take 1 capsule (100 mg total) by mouth 3 (three) times daily as needed for cough.  Dispense: 20 capsule; Refill: 0  2. Chronic obstructive pulmonary disease, unspecified COPD type (HCC) -  has wheezing -  continue Breo Ellipta, Fluticasone and albuterol PRN  3. Bipolar 2 disorder, major depressive episode (HCC) -  PHQ-9 score 18, has moderate depression -  denies plans of hurting self -  continue current regimen -  has a follow up with psychiatry next week    Time spent on non face to face visit:   11 minutes  The patient gave consent to this video visit. Explained to the patient the risk and privacy issue that was involved with this video call.   The patient was advised to call back and ask for an in-person evaluation if the symptoms worsen or if the condition fails to improve.   Kenard Gower, NP [employer] [phone]

## 2023-04-11 NOTE — Patient Instructions (Signed)
Acute Bronchitis, Adult  Acute bronchitis is when air tubes in the lungs (bronchi) suddenly get swollen. The condition can make it hard for you to breathe. In adults, acute bronchitis usually goes away within 2 weeks. A cough caused by bronchitis may last up to 3 weeks. Smoking, allergies, and asthma can make the condition worse. What are the causes? Germs that cause cold and flu (viruses). The most common cause of this condition is the virus that causes the common cold. Bacteria. Substances that bother (irritate) the lungs, including: Smoke from cigarettes and other types of tobacco. Dust and pollen. Fumes from chemicals, gases, or burned fuel. Indoor or outdoor air pollution. What increases the risk? A weak body's defense system. This is also called the immune system. Any condition that affects your lungs and breathing, such as asthma. What are the signs or symptoms? A cough. Coughing up clear, yellow, or green mucus. Making high-pitched whistling sounds when you breathe, most often when you breathe out (wheezing). Runny or stuffy nose. Having too much mucus in your lungs (chest congestion). Shortness of breath. Body aches. A sore throat. How is this treated? Acute bronchitis may go away over time without treatment. Your doctor may tell you to: Drink more fluids. This will help thin your mucus so it is easier to cough up. Use a device that gets medicine into your lungs (inhaler). Use a vaporizer or a humidifier. These are machines that add water to the air. This helps with coughing and poor breathing. Take a medicine that thins mucus and helps clear it from your lungs. Take a medicine that prevents or stops coughing. It is not common to take an antibiotic medicine for this condition. Follow these instructions at home:  Take over-the-counter and prescription medicines only as told by your doctor. Use an inhaler, vaporizer, or humidifier as told by your doctor. Take two teaspoons  (10 mL) of honey at bedtime. This helps lessen your coughing at night. Drink enough fluid to keep your pee (urine) pale yellow. Do not smoke or use any products that contain nicotine or tobacco. If you need help quitting, ask your doctor. Get a lot of rest. Return to your normal activities when your doctor says that it is safe. Keep all follow-up visits. How is this prevented?  Wash your hands often with soap and water for at least 20 seconds. If you cannot use soap and water, use hand sanitizer. Avoid contact with people who have cold symptoms. Try not to touch your mouth, nose, or eyes with your hands. Avoid breathing in smoke or chemical fumes. Make sure to get the flu shot every year. Contact a doctor if: Your symptoms do not get better in 2 weeks. You have trouble coughing up the mucus. Your cough keeps you awake at night. You have a fever. Get help right away if: You cough up blood. You have chest pain. You have very bad shortness of breath. You faint or keep feeling like you are going to faint. You have a very bad headache. Your fever or chills get worse. These symptoms may be an emergency. Get help right away. Call your local emergency services (911 in the [place] Do not wait to see if the symptoms will go away. Do not drive yourself to the hospital. Summary Acute bronchitis is when air tubes in the lungs (bronchi) suddenly get swollen. In adults, acute bronchitis usually goes away within 2 weeks. Drink more fluids. This will help thin your mucus so it is easier  to cough up. Take over-the-counter and prescription medicines only as told by your doctor. Contact a doctor if your symptoms do not improve after 2 weeks of treatment. This information is not intended to replace advice given to you by your health care provider. Make sure you discuss any questions you have with your health care provider. Document Revised: 08/20/2020 Document Reviewed: 08/20/2020 [employer] Patient  Education  2024 [employer].

## 2023-04-15 ENCOUNTER — Encounter (HOSPITAL_COMMUNITY): Payer: Self-pay

## 2023-04-15 ENCOUNTER — Ambulatory Visit (INDEPENDENT_AMBULATORY_CARE_PROVIDER_SITE_OTHER): Payer: Medicare PPO | Admitting: Clinical

## 2023-04-15 ENCOUNTER — Encounter (HOSPITAL_COMMUNITY): Payer: Self-pay | Admitting: Clinical

## 2023-04-15 DIAGNOSIS — F4310 Post-traumatic stress disorder, unspecified: Secondary | ICD-10-CM | POA: Diagnosis not present

## 2023-04-15 DIAGNOSIS — F411 Generalized anxiety disorder: Secondary | ICD-10-CM | POA: Diagnosis not present

## 2023-04-15 DIAGNOSIS — F424 Excoriation (skin-picking) disorder: Secondary | ICD-10-CM

## 2023-04-15 DIAGNOSIS — F3181 Bipolar II disorder: Secondary | ICD-10-CM | POA: Diagnosis not present

## 2023-04-15 NOTE — Progress Notes (Addendum)
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   Professionals Available:  Agency:  [facility] Clinician: Ambrose Mantle, LCSW Emergency: Please call 911  Suicide Hotline:  988  Reasons for living mentioned by the patient: not wanting to harm other people by leaving behind horrible memories of a mess to clean up and what they saw  Summary: Patient has not attempted suicide since 2014, has no current specific plan although he has tossed some ideas out to wife, has supportive factors, and completely denies intent  Phone Number to National Suicide Hotline provided to patient.    Lynnell Chad, LCSW

## 2023-04-22 ENCOUNTER — Ambulatory Visit: Payer: Medicare PPO | Admitting: Adult Health

## 2023-04-22 ENCOUNTER — Encounter: Payer: Self-pay | Admitting: Adult Health

## 2023-04-22 VITALS — BP 122/78 | HR 83 | Temp 98.2°F | Resp 18 | Ht 73.0 in | Wt 172.8 lb

## 2023-04-22 DIAGNOSIS — J449 Chronic obstructive pulmonary disease, unspecified: Secondary | ICD-10-CM

## 2023-04-22 DIAGNOSIS — E118 Type 2 diabetes mellitus with unspecified complications: Secondary | ICD-10-CM

## 2023-04-22 DIAGNOSIS — F3181 Bipolar II disorder: Secondary | ICD-10-CM | POA: Diagnosis not present

## 2023-04-22 DIAGNOSIS — I10 Essential (primary) hypertension: Secondary | ICD-10-CM | POA: Diagnosis not present

## 2023-04-22 DIAGNOSIS — E785 Hyperlipidemia, unspecified: Secondary | ICD-10-CM

## 2023-04-22 DIAGNOSIS — G629 Polyneuropathy, unspecified: Secondary | ICD-10-CM

## 2023-04-22 DIAGNOSIS — F5101 Primary insomnia: Secondary | ICD-10-CM

## 2023-04-22 NOTE — Progress Notes (Signed)
[facility] clinic  Provider:  Kenard Gower DNP  Code Status:  Full Code  Goals of Care:     02/21/2023    9:03 AM  Advanced Directives  Does Patient Have a Medical Advance Directive? No  Would patient like information on creating a medical advance directive? No - Patient declined     Chief Complaint  Patient presents with   Medical Management of Chronic Issues    2 months follow-up   Immunizations    covid    HPI: Patient is a 62 y.o. male seen today for follow up of chronic medical issues. He was accompanied today by his wife.   Essential hypertension -  BP 122/78, takes Amlodipine, Metoprolol tartrate, Losartan  Chronic obstructive pulmonary disease, unspecified COPD type (HCC) -  has  3X/week has SOB/wheezing, takes Breo Ellipta and Albuterol HFA PRN, recently completed antibiotic for acute bronchitis  Bipolar 2 disorder, major depressive episode (HCC) -  works at food bank at least 1X/week, takes Valium, Lexapro, Lithium  Type 2 diabetes mellitus with complication, without long-term current use of insulin (HCC) -  blood sugar this morning 105, takes Metformin  Primary insomnia -  sleeps 6 hours/night, takes Trazodone  Neuropathy -   left leg hurting, 4/10 pain, takes Gabapentin   Past Medical History:  Diagnosis Date   Anxiety    Asthma    COPD (chronic obstructive pulmonary disease) (HCC)    Depression    Diabetes mellitus, type II (HCC)    GERD (gastroesophageal reflux disease)    Gout    HTN (hypertension)    IBS (irritable bowel syndrome)    Major depressive disorder in partial remission (HCC) 08/30/2017   Mixed bipolar I disorder in remission (HCC) 08/09/2022   Palpitations    Peripheral artery disease (HCC)     Past Surgical History:  Procedure Laterality Date   APPENDECTOMY  1977   FUNDOPLASTY TRANSTHORACIC  2008   HERNIA REPAIR  2014   RECONSTRUCTION OF NOSE  1978    Allergies  Allergen Reactions   Bee Pollen    Milk-Related  Compounds Diarrhea   Penicillins Rash    Outpatient Encounter Medications as of 04/22/2023  Medication Sig   albuterol (PROAIR HFA) 108 (90 Base) MCG/ACT inhaler Inhale 1-2 puffs into the lungs every 6 (six) hours as needed for wheezing or shortness of breath.   amLODipine (NORVASC) 5 MG tablet Take 5 mg by mouth daily.   atorvastatin (LIPITOR) 20 MG tablet Take 1 tablet (20 mg total) by mouth daily.   benzonatate (TESSALON PERLES) 100 MG capsule Take 1 capsule (100 mg total) by mouth 3 (three) times daily as needed for cough.   BREO ELLIPTA 200-25 MCG/ACT AEPB Inhale 1 puff into the lungs daily.   diazepam (VALIUM) 5 MG tablet Take 1 tablet (5 mg total) by mouth 2 (two) times daily.   escitalopram (LEXAPRO) 10 MG tablet Take 1 tablet (10 mg total) by mouth daily. Take with 5 mg tablet for daily total of 15 mg.   escitalopram (LEXAPRO) 5 MG tablet Take 1 tablet (5 mg total) by mouth daily. Take with 10 mg tablet for daily total of 15 mg.   Fish Oil-Cholecalciferol (FISH OIL + D3 PO) Take by mouth.   fluticasone (FLONASE) 50 MCG/ACT nasal spray Place 2 sprays into both nostrils daily.   fluticasone (FLOVENT HFA) 110 MCG/ACT inhaler Inhale 2 puffs into the lungs 2 (two) times daily.   gabapentin (NEURONTIN) 100 MG capsule Take  2 capsules (200 mg total) by mouth at bedtime.   glucose blood test strip Use to test blood sugars 2 times daily.   indomethacin (INDOCIN) 25 MG capsule Take 1 capsule (25 mg total) by mouth 3 (three) times daily as needed.   lithium carbonate 300 MG capsule Take 2 capsules (600 mg total) by mouth daily with breakfast AND 3 capsules (900 mg total) daily after supper.   losartan (COZAAR) 25 MG tablet Take 1 tablet (25 mg total) by mouth daily.   metFORMIN (GLUCOPHAGE) 500 MG tablet Take 1 tablet (500 mg total) by mouth 2 (two) times daily with a meal.   metoprolol tartrate (LOPRESSOR) 100 MG tablet Take 100 mg by mouth daily.   pantoprazole (PROTONIX) 40 MG tablet Take 40  mg by mouth daily.   traZODone (DESYREL) 100 MG tablet Take 1 tablet (100 mg total) by mouth at bedtime.   TRUEplus Lancets 30G MISC Use to test blood sugars 2 times daily.   No facility-administered encounter medications on file as of 04/22/2023.    Review of Systems:  Review of Systems  Constitutional:  Negative for activity change, appetite change and fever.  HENT:  Negative for sore throat.   Eyes: Negative.   Cardiovascular:  Negative for chest pain and leg swelling.  Gastrointestinal:  Negative for abdominal distention, diarrhea and vomiting.  Genitourinary:  Negative for dysuria, frequency and urgency.  Musculoskeletal:        Left leg hurting  Skin:  Negative for color change.  Neurological:  Negative for dizziness and headaches.  Psychiatric/Behavioral:  Negative for behavioral problems and sleep disturbance. The patient is not nervous/anxious.     Health Maintenance  Topic Date Due   COVID-19 Vaccine (8 - 2024-25 season) 03/25/2023   OPHTHALMOLOGY EXAM  07/02/2023   HEMOGLOBIN A1C  07/13/2023   FOOT EXAM  12/02/2023   Diabetic kidney evaluation - eGFR measurement  01/13/2024   Diabetic kidney evaluation - Urine ACR  01/13/2024   Medicare Annual Wellness (AWV)  03/20/2024   Colonoscopy  05/04/2027   DTaP/Tdap/Td (4 - Td or Tdap) 11/14/2029   INFLUENZA VACCINE  Completed   Hepatitis C Screening  Completed   HIV Screening  Completed   Zoster Vaccines- Shingrix  Completed   HPV VACCINES  Aged Out    Physical Exam: Vitals:   04/22/23 0901  BP: 122/78  Pulse: 83  Resp: 18  Temp: 98.2 F (36.8 C)  SpO2: 96%  Weight: 172 lb 12.8 oz (78.4 kg)  Height: 6\' 1"  (1.854 m)   Body mass index is 22.8 kg/m. Physical Exam Constitutional:      Appearance: Normal appearance.  HENT:     Head: Normocephalic and atraumatic.     Mouth/Throat:     Mouth: Mucous membranes are moist.  Eyes:     Conjunctiva/sclera: Conjunctivae normal.  Cardiovascular:     Rate and  Rhythm: Normal rate and regular rhythm.     Pulses: Normal pulses.     Heart sounds: Normal heart sounds.  Pulmonary:     Effort: Pulmonary effort is normal.     Breath sounds: Normal breath sounds.  Abdominal:     General: Bowel sounds are normal.     Palpations: Abdomen is soft.  Musculoskeletal:        General: No swelling. Normal range of motion.     Cervical back: Normal range of motion.  Skin:    General: Skin is warm and dry.  Neurological:  General: No focal deficit present.     Mental Status: He is alert and oriented to person, place, and time.  Psychiatric:        Mood and Affect: Mood normal.        Behavior: Behavior normal.        Thought Content: Thought content normal.        Judgment: Judgment normal.     Labs reviewed: Basic Metabolic Panel: Recent Labs    07/08/22 1329 10/20/22 1022 01/13/23 1059  NA 142 140 139  K 4.6 4.6 4.6  CL 107 106 106  CO2 24 27 25   GLUCOSE 100* 102* 102  BUN 17 14 15   CREATININE 0.95 0.79 0.77  CALCIUM 10.0 9.8 9.6   Liver Function Tests: Recent Labs    07/08/22 1329 01/13/23 1059  AST 13 15  ALT 24 26  BILITOT 2.5* 1.7*  PROT 7.2 6.8   No results for input(s): "LIPASE", "AMYLASE" in the last 8760 hours. No results for input(s): "AMMONIA" in the last 8760 hours. CBC: Recent Labs    07/08/22 1329 10/20/22 1022 01/13/23 1059  WBC 6.9 5.1 7.2  NEUTROABS 4,326 3,035 4,630  HGB 15.3 15.1 14.0  HCT 44.9 44.7 42.1  MCV 88.4 87.1 87.2  PLT 307 272 304   Lipid Panel: Recent Labs    07/08/22 1329  CHOL 111  HDL 39*  LDLCALC 39  TRIG 578*  CHOLHDL 2.8   Lab Results  Component Value Date   HGBA1C 6.1 (H) 01/13/2023    Procedures since last visit: No results found.  Assessment/Plan  1. Essential hypertension (Primary) -BP stable -Continue current regimen  2. Chronic obstructive pulmonary disease, unspecified COPD type (HCC) -  no wheezing -Continue Breo Ellipta and albuterol PRN  3. Bipolar  2 disorder, major depressive episode (HCC) -Mood is stable -   Works at a food bank once a week -   Continue lithium, Lexapro and Valium  4. Type 2 diabetes mellitus with complication, without long-term current use of insulin (HCC) Lab Results  Component Value Date   HGBA1C 6.1 (H) 01/13/2023    -  Blood sugar stable -   continue metformin - Hemo globin A1C  5. Primary insomnia -  sleeps 6 hours/night -   Continue trazodone   6. Neuropathy -  continue Gabapentin  7. Hyperlipidemia LDL goal <100 Lab Results  Component Value Date   CHOL 111 07/08/2022   HDL 39 (L) 07/08/2022   LDLCALC 39 07/08/2022   TRIG 314 (H) 07/08/2022   CHOLHDL 2.8 07/08/2022    -  continue Atorvastatin - Lipid panel    Labs/tests ordered:  A1C, lipid panel  Next appt:  Visit date not found

## 2023-04-23 LAB — LIPID PANEL
Cholesterol: 106 mg/dL (ref <200)
HDL: 43 mg/dL (ref >=40)
LDL Cholesterol (Calc): 44 mg/dL
Non-HDL Cholesterol (Calc): 63 mg/dL (ref <130)
Total CHOL/HDL Ratio: 2.5 (calc) (ref <5.0)
Triglycerides: 105 mg/dL (ref <150)

## 2023-04-23 LAB — HEMOGLOBIN A1C
Hgb A1c MFr Bld: 5.5 %{Hb} (ref <5.7)
Mean Plasma Glucose: 111 mg/dL
eAG (mmol/L): 6.2 mmol/L

## 2023-04-29 ENCOUNTER — Other Ambulatory Visit (HOSPITAL_COMMUNITY): Payer: Self-pay | Admitting: Student

## 2023-04-29 NOTE — Progress Notes (Signed)
-     A1C 5.5,  down from 6.1, very good, continue Metformin -  lipid panel all normal, continue Atorvastatin

## 2023-05-09 ENCOUNTER — Telehealth (HOSPITAL_BASED_OUTPATIENT_CLINIC_OR_DEPARTMENT_OTHER): Payer: Medicare PPO | Admitting: Student

## 2023-05-09 DIAGNOSIS — F4310 Post-traumatic stress disorder, unspecified: Secondary | ICD-10-CM | POA: Diagnosis not present

## 2023-05-09 DIAGNOSIS — F424 Excoriation (skin-picking) disorder: Secondary | ICD-10-CM | POA: Diagnosis not present

## 2023-05-09 DIAGNOSIS — F3181 Bipolar II disorder: Secondary | ICD-10-CM | POA: Diagnosis not present

## 2023-05-09 DIAGNOSIS — F411 Generalized anxiety disorder: Secondary | ICD-10-CM

## 2023-05-09 DIAGNOSIS — F5105 Insomnia due to other mental disorder: Secondary | ICD-10-CM

## 2023-05-09 DIAGNOSIS — Z79899 Other long term (current) drug therapy: Secondary | ICD-10-CM
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
## 2023-05-10 ENCOUNTER — Encounter (HOSPITAL_COMMUNITY): Payer: Self-pay | Admitting: Student

## 2023-05-11 NOTE — Addendum Note (Signed)
 Addended by: Everlena Cooper on: 05/11/2023 11:05 AM   Modules accepted: Level of Service

## 2023-05-21 ENCOUNTER — Other Ambulatory Visit (INDEPENDENT_AMBULATORY_CARE_PROVIDER_SITE_OTHER): Payer: Self-pay | Admitting: Otolaryngology

## 2023-06-16 ENCOUNTER — Ambulatory Visit (HOSPITAL_COMMUNITY): Payer: Medicare PPO | Admitting: Clinical

## 2023-06-16 DIAGNOSIS — F411 Generalized anxiety disorder: Secondary | ICD-10-CM

## 2023-06-16 DIAGNOSIS — F3181 Bipolar II disorder: Secondary | ICD-10-CM | POA: Diagnosis not present

## 2023-06-16 DIAGNOSIS — F4310 Post-traumatic stress disorder, unspecified: Secondary | ICD-10-CM | POA: Diagnosis not present

## 2023-06-16 DIAGNOSIS — F424 Excoriation (skin-picking) disorder: Secondary | ICD-10-CM | POA: Diagnosis not present
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
## 2023-06-19 ENCOUNTER — Encounter (HOSPITAL_COMMUNITY): Payer: Self-pay | Admitting: Clinical

## 2023-06-19 ENCOUNTER — Other Ambulatory Visit: Payer: Self-pay | Admitting: Adult Health

## 2023-06-19 DIAGNOSIS — J4530 Mild persistent asthma, uncomplicated: Secondary | ICD-10-CM

## 2023-06-27 ENCOUNTER — Telehealth (HOSPITAL_BASED_OUTPATIENT_CLINIC_OR_DEPARTMENT_OTHER): Payer: Medicare PPO | Admitting: Student

## 2023-06-27 ENCOUNTER — Encounter (HOSPITAL_COMMUNITY): Payer: Self-pay | Admitting: Student

## 2023-06-27 DIAGNOSIS — F5105 Insomnia due to other mental disorder: Secondary | ICD-10-CM | POA: Diagnosis not present

## 2023-06-27 DIAGNOSIS — F3181 Bipolar II disorder: Secondary | ICD-10-CM

## 2023-06-27 DIAGNOSIS — Z79899 Other long term (current) drug therapy: Secondary | ICD-10-CM | POA: Diagnosis not present

## 2023-06-27 DIAGNOSIS — F4310 Post-traumatic stress disorder, unspecified: Secondary | ICD-10-CM | POA: Diagnosis not present

## 2023-06-27 MED ORDER — MIRTAZAPINE 45 MG PO TABS: 45.0000 mg | ORAL_TABLET | Freq: Every day | ORAL | 1 refills | Status: DC

## 2023-06-27 MED ORDER — DIAZEPAM 5 MG PO TABS: 5.0000 mg | ORAL_TABLET | Freq: Every evening | ORAL | 1 refills | Status: DC | PRN

## 2023-06-27 MED ORDER — ESCITALOPRAM OXALATE 10 MG PO TABS: 10.0000 mg | ORAL_TABLET | Freq: Every day | ORAL | 1 refills | Status: DC

## 2023-06-27 MED ORDER — DIAZEPAM 2 MG PO TABS: 4.0000 mg | ORAL_TABLET | Freq: Every morning | ORAL | 1 refills | Status: DC

## 2023-06-27 MED ORDER — LITHIUM CARBONATE 300 MG PO CAPS: ORAL_CAPSULE | ORAL | 0 refills | Status: DC
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
## 2023-06-27 NOTE — Addendum Note (Signed)
 Addended by: Everlena Cooper on: 06/27/2023 03:21 PM   Modules accepted: Level of Service

## 2023-06-28 ENCOUNTER — Ambulatory Visit (HOSPITAL_COMMUNITY): Payer: Medicare PPO

## 2023-06-30 ENCOUNTER — Ambulatory Visit (HOSPITAL_COMMUNITY): Payer: Medicare PPO | Admitting: Clinical

## 2023-07-14 ENCOUNTER — Ambulatory Visit (HOSPITAL_COMMUNITY): Payer: Medicare PPO | Admitting: Clinical

## 2023-07-14 ENCOUNTER — Encounter (HOSPITAL_COMMUNITY): Payer: Self-pay | Admitting: Clinical

## 2023-07-14 DIAGNOSIS — F4310 Post-traumatic stress disorder, unspecified: Secondary | ICD-10-CM | POA: Diagnosis not present

## 2023-07-14 DIAGNOSIS — F411 Generalized anxiety disorder: Secondary | ICD-10-CM | POA: Diagnosis not present

## 2023-07-14 DIAGNOSIS — F3181 Bipolar II disorder: Secondary | ICD-10-CM | POA: Diagnosis not present

## 2023-07-14 DIAGNOSIS — F424 Excoriation (skin-picking) disorder: Secondary | ICD-10-CM | POA: Diagnosis not present
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
## 2023-07-20 ENCOUNTER — Other Ambulatory Visit (HOSPITAL_COMMUNITY): Payer: Self-pay | Admitting: Student

## 2023-07-20 DIAGNOSIS — H35039 Hypertensive retinopathy, unspecified eye: Secondary | ICD-10-CM | POA: Insufficient documentation

## 2023-07-20 DIAGNOSIS — F5105 Insomnia due to other mental disorder: Secondary | ICD-10-CM

## 2023-07-20 DIAGNOSIS — F4310 Post-traumatic stress disorder, unspecified: Secondary | ICD-10-CM

## 2023-07-22 ENCOUNTER — Ambulatory Visit: Payer: Medicare PPO | Admitting: Adult Health

## 2023-07-25 ENCOUNTER — Ambulatory Visit (HOSPITAL_COMMUNITY): Payer: Medicare PPO | Admitting: Clinical

## 2023-08-01 ENCOUNTER — Ambulatory Visit (HOSPITAL_COMMUNITY): Payer: Medicare PPO

## 2023-08-01 ENCOUNTER — Ambulatory Visit (HOSPITAL_BASED_OUTPATIENT_CLINIC_OR_DEPARTMENT_OTHER): Payer: Medicare PPO | Admitting: Student

## 2023-08-01 ENCOUNTER — Encounter (HOSPITAL_COMMUNITY): Payer: Self-pay | Admitting: Student

## 2023-08-01 DIAGNOSIS — F5105 Insomnia due to other mental disorder: Secondary | ICD-10-CM

## 2023-08-01 DIAGNOSIS — F3181 Bipolar II disorder: Secondary | ICD-10-CM

## 2023-08-01 DIAGNOSIS — Z79899 Other long term (current) drug therapy: Secondary | ICD-10-CM

## 2023-08-01 DIAGNOSIS — F4310 Post-traumatic stress disorder, unspecified: Secondary | ICD-10-CM

## 2023-08-01 MED ORDER — DIAZEPAM 5 MG PO TABS: 5.0000 mg | ORAL_TABLET | Freq: Every evening | ORAL | 1 refills | Status: AC | PRN

## 2023-08-01 MED ORDER — DIAZEPAM 2 MG PO TABS: 4.0000 mg | ORAL_TABLET | Freq: Every morning | ORAL | 1 refills | Status: AC

## 2023-08-01 MED ORDER — MIRTAZAPINE 45 MG PO TABS: 45.0000 mg | ORAL_TABLET | Freq: Every day | ORAL | 1 refills | Status: DC

## 2023-08-01 MED ORDER — ESCITALOPRAM OXALATE 10 MG PO TABS: 10.0000 mg | ORAL_TABLET | Freq: Every day | ORAL | 1 refills | Status: DC
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
## 2023-08-01 NOTE — Progress Notes (Signed)
 Patient arrived today for his due labs and appointment with Toni Amend. Patient was in a good mood and tolerated procedure well and without complaint. Labs were drawn using a straight needle 21G in the right AC. Two tubes drawn, lavender and tiger top. I told patient I would call him with the results tomorrow.

## 2023-08-03 ENCOUNTER — Other Ambulatory Visit: Payer: Self-pay | Admitting: Adult Health

## 2023-08-03 DIAGNOSIS — F5101 Primary insomnia: Secondary | ICD-10-CM

## 2023-08-03 LAB — CBC WITH DIFFERENTIAL/PLATELET

## 2023-08-03 NOTE — Telephone Encounter (Signed)
 High risk warning populated when attempting to refill medication  Please review and approval if appropriate   Thanks,  [facility] W/CMA

## 2023-08-04 ENCOUNTER — Encounter (HOSPITAL_COMMUNITY): Payer: Self-pay | Admitting: Student

## 2023-08-04 LAB — CBC WITH DIFFERENTIAL/PLATELET

## 2023-08-04 LAB — COMPREHENSIVE METABOLIC PANEL WITH GFR
ALT: 36 IU/L (ref 0–44)
AST: 22 IU/L (ref 0–40)
Albumin: 4.4 g/dL (ref 3.9–4.9)
Alkaline Phosphatase: 98 IU/L (ref 44–121)
BUN/Creatinine Ratio: 15 (ref 10–24)
BUN: 14 mg/dL (ref 8–27)
Bilirubin Total: 1.3 mg/dL — ABNORMAL HIGH (ref 0.0–1.2)
CO2: 20 mmol/L (ref 20–29)
Calcium: 9.3 mg/dL (ref 8.6–10.2)
Chloride: 109 mmol/L — ABNORMAL HIGH (ref 96–106)
Creatinine, Ser: 0.96 mg/dL (ref 0.76–1.27)
Globulin, Total: 2.1 g/dL (ref 1.5–4.5)
Glucose: 115 mg/dL — ABNORMAL HIGH (ref 70–99)
Potassium: 4.8 mmol/L (ref 3.5–5.2)
Sodium: 143 mmol/L (ref 134–144)
Total Protein: 6.5 g/dL (ref 6.0–8.5)
eGFR: 88 mL/min/{1.73_m2} (ref >59)

## 2023-08-04 LAB — LITHIUM LEVEL: Lithium Lvl: 0.6 mmol/L (ref 0.5–1.2)

## 2023-08-04 LAB — TSH: TSH: 2.27 u[IU]/mL (ref 0.450–4.500)

## 2023-08-15 ENCOUNTER — Encounter: Payer: Self-pay | Admitting: Adult Health

## 2023-08-15 ENCOUNTER — Ambulatory Visit (INDEPENDENT_AMBULATORY_CARE_PROVIDER_SITE_OTHER): Admitting: Adult Health

## 2023-08-15 VITALS — BP 116/78 | HR 79 | Temp 98.0°F | Resp 18 | Ht 73.0 in | Wt 178.0 lb

## 2023-08-15 DIAGNOSIS — G629 Polyneuropathy, unspecified: Secondary | ICD-10-CM

## 2023-08-15 DIAGNOSIS — R300 Dysuria: Secondary | ICD-10-CM

## 2023-08-15 DIAGNOSIS — I10 Essential (primary) hypertension: Secondary | ICD-10-CM

## 2023-08-15 DIAGNOSIS — J4530 Mild persistent asthma, uncomplicated: Secondary | ICD-10-CM

## 2023-08-15 DIAGNOSIS — E118 Type 2 diabetes mellitus with unspecified complications: Secondary | ICD-10-CM

## 2023-08-15 DIAGNOSIS — F3181 Bipolar II disorder: Secondary | ICD-10-CM | POA: Diagnosis not present

## 2023-08-15 DIAGNOSIS — N401 Enlarged prostate with lower urinary tract symptoms: Secondary | ICD-10-CM | POA: Diagnosis not present

## 2023-08-15 DIAGNOSIS — F5101 Primary insomnia: Secondary | ICD-10-CM

## 2023-08-15 DIAGNOSIS — K219 Gastro-esophageal reflux disease without esophagitis: Secondary | ICD-10-CM

## 2023-08-15 DIAGNOSIS — J302 Other seasonal allergic rhinitis: Secondary | ICD-10-CM

## 2023-08-15 DIAGNOSIS — N3943 Post-void dribbling: Secondary | ICD-10-CM

## 2023-08-15 DIAGNOSIS — Z125 Encounter for screening for malignant neoplasm of prostate: Secondary | ICD-10-CM

## 2023-08-15 DIAGNOSIS — Z122 Encounter for screening for malignant neoplasm of respiratory organs: Secondary | ICD-10-CM

## 2023-08-15 LAB — POCT URINALYSIS DIPSTICK (MANUAL)
Leukocytes, UA: NEGATIVE
Nitrite, UA: NEGATIVE
Poct Bilirubin: NEGATIVE
Poct Blood: NEGATIVE
Poct Glucose: NORMAL mg/dL
Poct Ketones: NEGATIVE
Poct Protein: NEGATIVE mg/dL
Poct Urobilinogen: NORMAL mg/dL
Spec Grav, UA: 1.02 (ref 1.010–1.025)
pH, UA: 6.5 (ref 5.0–8.0)

## 2023-08-15 MED ORDER — BREZTRI AEROSPHERE 160-9-4.8 MCG/ACT IN AERO: 2.0000 | INHALATION_SPRAY | Freq: Two times a day (BID) | RESPIRATORY_TRACT | Status: DC

## 2023-08-15 NOTE — Progress Notes (Unsigned)
 [facility] clinic  Provider:  Kenard Gower DNP  Code Status:  Full Code  Goals of Care:     08/15/2023    3:10 PM  Advanced Directives  Does Patient Have a Medical Advance Directive? No  Would patient like information on creating a medical advance directive? No - Patient declined     Chief Complaint  Patient presents with   Medical Management of Chronic Issues    3 month follow up/needs to discuss Pneumonia , Covid vaccine and eye exam.   Discussed the use of AI scribe software for clinical note transcription with the patient, who gave verbal consent to proceed.  HPI: Patient is a 63 y.o. male seen today for a 50-month follow up of chronic medical issues. He was accompanied today by his wife.  He is experiencing difficulty urinating and dysuria. He is currently taking Flomax 0.4 mg daily for benign prostatic hyperplasia (BPH).  His asthma is stable with no wheezing or shortness of breath. He uses Breztri, two puffs daily, as prescribed by his pulmonologist in [place], and uses albuterol as needed, approximately once or twice a week. He no longer takes fluticasone but continues to use a nasal spray for allergies.  His mood is stable with his current bipolar disorder treatment regimen, which includes Lexapro 10 mg daily, lithium 600 mg in the morning and 900 mg at night, and Valium 4 mg in the morning and 5 mg at bedtime as needed. He also takes trazodone 100 mg and Remeron 45 mg at bedtime, sleeping five to six hours per night.  His hypertension is well-controlled with a blood pressure reading of 116/78 mmHg. He takes amlodipine 5 mg, losartan 25 mg, and metoprolol tartrate 100 mg daily.  His diabetes is managed with metformin 500 mg twice a day, and his recent blood sugar reading at home was 117 mg/dL.  He reports no issues with acid reflux and takes Protonix 40 mg daily.     Past Medical History:  Diagnosis Date   Anxiety    Asthma    COPD (chronic obstructive  pulmonary disease) (HCC)    Depression    Diabetes mellitus, type II (HCC)    GERD (gastroesophageal reflux disease)    Gout    HTN (hypertension)    IBS (irritable bowel syndrome)    Major depressive disorder in partial remission (HCC) 08/30/2017   Mixed bipolar I disorder in remission (HCC) 08/09/2022   Palpitations    Peripheral artery disease (HCC)     Past Surgical History:  Procedure Laterality Date   APPENDECTOMY  1977   FUNDOPLASTY TRANSTHORACIC  2008   HERNIA REPAIR  2014   RECONSTRUCTION OF NOSE  1978    Allergies  Allergen Reactions   Bee Pollen    Milk-Related Compounds Diarrhea   Penicillins Rash    Outpatient Encounter Medications as of 08/15/2023  Medication Sig   albuterol (VENTOLIN HFA) 108 (90 Base) MCG/ACT inhaler INHALE 1-2 PUFFS BY MOUTH EVERY 6 HOURS AS NEEDED FOR WHEEZE OR SHORTNESS OF BREATH   amLODipine (NORVASC) 5 MG tablet Take 5 mg by mouth daily.   atorvastatin (LIPITOR) 20 MG tablet Take 1 tablet (20 mg total) by mouth daily.   budeson-glycopyrrolate-formoterol (BREZTRI AEROSPHERE) 160-9-4.8 MCG/ACT AERO inhaler Inhale 2 puffs into the lungs 2 (two) times daily.   diazepam (VALIUM) 2 MG tablet Take 2 tablets (4 mg total) by mouth in the morning.   diazepam (VALIUM) 5 MG tablet Take 1 tablet (5 mg total)  by mouth at bedtime as needed.   dicyclomine (BENTYL) 20 MG tablet Take 20 mg by mouth 2 (two) times daily.   escitalopram (LEXAPRO) 10 MG tablet Take 1 tablet (10 mg total) by mouth daily.   Fish Oil-Cholecalciferol (FISH OIL + D3 PO) Take by mouth.   fluticasone (FLONASE) 50 MCG/ACT nasal spray SPRAY 2 SPRAYS INTO EACH NOSTRIL EVERY DAY   gabapentin (NEURONTIN) 100 MG capsule Take 2 capsules (200 mg total) by mouth at bedtime.   glucose blood test strip Use to test blood sugars 2 times daily.   indomethacin (INDOCIN) 25 MG capsule Take 1 capsule (25 mg total) by mouth 3 (three) times daily as needed.   lithium carbonate 300 MG capsule Take 2  capsules (600 mg total) by mouth daily with breakfast AND 3 capsules (900 mg total) daily after supper.   losartan (COZAAR) 25 MG tablet Take 1 tablet (25 mg total) by mouth daily.   metFORMIN (GLUCOPHAGE) 500 MG tablet Take 1 tablet (500 mg total) by mouth 2 (two) times daily with a meal.   metoprolol tartrate (LOPRESSOR) 100 MG tablet Take 100 mg by mouth daily.   mirtazapine (REMERON) 45 MG tablet Take 1 tablet (45 mg total) by mouth at bedtime.   OXYGEN Place 2 L/min into the nose at bedtime.   pantoprazole (PROTONIX) 40 MG tablet Take 40 mg by mouth daily.   tamsulosin (FLOMAX) 0.4 MG CAPS capsule Take 0.4 mg by mouth daily.   traZODone (DESYREL) 100 MG tablet TAKE 1 TABLET BY MOUTH EVERYDAY AT BEDTIME   TRUEplus Lancets 30G MISC Use to test blood sugars 2 times daily.   [DISCONTINUED] benzonatate (TESSALON PERLES) 100 MG capsule Take 1 capsule (100 mg total) by mouth 3 (three) times daily as needed for cough.   [DISCONTINUED] fluticasone (FLOVENT HFA) 110 MCG/ACT inhaler Inhale 2 puffs into the lungs 2 (two) times daily. (Patient not taking: Reported on 08/15/2023)   [DISCONTINUED] metoprolol succinate (TOPROL-XL) 50 MG 24 hr tablet Take 50 mg by mouth daily. (Patient not taking: Reported on 08/15/2023)   No facility-administered encounter medications on file as of 08/15/2023.    Review of Systems:  Review of Systems  Constitutional:  Negative for activity change, appetite change and fever.  HENT:  Negative for sore throat.   Eyes: Negative.   Cardiovascular:  Negative for chest pain and leg swelling.  Gastrointestinal:  Negative for abdominal distention, diarrhea and vomiting.  Genitourinary:  Negative for dysuria, frequency and urgency.  Skin:  Negative for color change.  Neurological:  Negative for dizziness and headaches.  Psychiatric/Behavioral:  Negative for behavioral problems and sleep disturbance. The patient is not nervous/anxious.     Health Maintenance  Topic Date Due    Pneumococcal Vaccine 77-27 Years old (2 of 2 - PCV) 07/13/2018   OPHTHALMOLOGY EXAM  07/02/2023   COVID-19 Vaccine (8 - 2024-25 season) 07/28/2023   HEMOGLOBIN A1C  10/21/2023   INFLUENZA VACCINE  12/02/2023   FOOT EXAM  12/02/2023   Diabetic kidney evaluation - Urine ACR  01/13/2024   Medicare Annual Wellness (AWV)  03/20/2024   Diabetic kidney evaluation - eGFR measurement  07/31/2024   Colonoscopy  05/04/2027   DTaP/Tdap/Td (5 - Td or Tdap) 07/21/2033   Hepatitis C Screening  Completed   HIV Screening  Completed   Zoster Vaccines- Shingrix  Completed   HPV VACCINES  Aged Out   Meningococcal B Vaccine  Aged Out    Physical Exam: Vitals:  08/15/23 0856  BP: 116/78  Pulse: 79  Resp: 18  Temp: 98 F (36.7 C)  SpO2: 94%  Weight: 178 lb (80.7 kg)  Height: 6\' 1"  (1.854 m)   Body mass index is 23.48 kg/m. Physical Exam Constitutional:      General: He is not in acute distress.    Appearance: Normal appearance.  HENT:     Head: Normocephalic and atraumatic.     Mouth/Throat:     Mouth: Mucous membranes are moist.  Eyes:     Conjunctiva/sclera: Conjunctivae normal.  Cardiovascular:     Rate and Rhythm: Normal rate and regular rhythm.     Pulses: Normal pulses.     Heart sounds: Normal heart sounds.  Pulmonary:     Effort: Pulmonary effort is normal.     Breath sounds: Normal breath sounds.  Abdominal:     General: Bowel sounds are normal.     Palpations: Abdomen is soft.  Musculoskeletal:        General: No swelling. Normal range of motion.     Cervical back: Normal range of motion.  Skin:    General: Skin is warm and dry.  Neurological:     General: No focal deficit present.     Mental Status: He is alert and oriented to person, place, and time.  Psychiatric:        Mood and Affect: Mood normal.        Behavior: Behavior normal.        Thought Content: Thought content normal.        Judgment: Judgment normal.     Labs reviewed: Basic Metabolic  Panel: Recent Labs    10/20/22 1022 01/13/23 1059 08/01/23 0929  NA 140 139 143  K 4.6 4.6 4.8  CL 106 106 109*  CO2 27 25 20   GLUCOSE 102* 102 115*  BUN 14 15 14   CREATININE 0.79 0.77 0.96  CALCIUM 9.8 9.6 9.3  TSH  --   --  2.270   Liver Function Tests: Recent Labs    01/13/23 1059 08/01/23 0929  AST 15 22  ALT 26 36  ALKPHOS  --  98  BILITOT 1.7* 1.3*  PROT 6.8 6.5  ALBUMIN  --  4.4   No results for input(s): "LIPASE", "AMYLASE" in the last 8760 hours. No results for input(s): "AMMONIA" in the last 8760 hours. CBC: Recent Labs    10/20/22 1022 01/13/23 1059 08/01/23 0929  WBC 5.1 7.2 CANCELED  NEUTROABS 3,035 4,630  --   HGB 15.1 14.0 CANCELED  HCT 44.7 42.1 CANCELED  MCV 87.1 87.2  --   PLT 272 304 CANCELED   Lipid Panel: Recent Labs    04/22/23 0952  CHOL 106  HDL 43  LDLCALC 44  TRIG 105  CHOLHDL 2.5   Lab Results  Component Value Date   HGBA1C 5.5 04/22/2023    Procedures since last visit: No results found.  Assessment/Plan  1. Benign prostatic hyperplasia with post-void dribbling (Primary) -  Experiencing difficulty urinating and dysuria, potentially related to BPH. Considering a possible urinary tract infection as a differential diagnosis. - Refer to urologist for further evaluation. -   Continue Flomax 0.4 mg 1 capsule daily - Check urine for infection, urine dipstick was negative for nitrite, negative for protein, negative blood, negative leukocytes, negative for UTI - Ambulatory referral to Urology  2. Mild persistent asthma without complication -  Asthma is well-managed with Breztri and albuterol as needed. - budeson-glycopyrrolate-formoterol (BREZTRI AEROSPHERE)  160-9-4.8 MCG/ACT AERO inhaler; Inhale 2 puffs into the lungs 2 (two) times daily.  3. Bipolar 2 disorder, major depressive episode (HCC) -  Mood is stable with current medication regimen. - Follows up with psychiatry  4. Essential hypertension -  Blood pressure is  well-controlled with current medications.  5. Type 2 diabetes mellitus with complication, without long-term current use of insulin (HCC) -  Blood sugar is well-controlled with metformin.  6. Gastroesophageal reflux disease without esophagitis -  Acid reflux is well-controlled with Protonix.  7. Dysuria -   Denies hematuria, fever, chills - POCT Urinalysis Dip Manual was negative for nitrite, negative for protein, negative blood, negative leukocytes, negative for UTI - PSA; Future  8. Neuropathy -Stable - Continue gabapentin 100 mg/caps. take 2 capsules = 200 mg at bedtime  9. Primary insomnia -Sleeps 5 to 6 hours per night -   trazodone 100 mg   10. Screening for prostate cancer -  PSA  11. Screening for lung cancer - CT CHEST without contrast; Future  12. Seasonal allergic rhinitis, unspecified trigger -Continue Flonase 50 mcg/ACT instill 2 sprays into each nostril daily     Labs/tests ordered:   Urine dipstick, PSA, CT chest without contrast   Return in about 3 months (around 11/14/2023).  Matthew Bultman Medina-Vargas, NP

## 2023-08-18 ENCOUNTER — Telehealth: Payer: Self-pay | Admitting: *Deleted

## 2023-08-18 NOTE — Telephone Encounter (Signed)
 New order for CT chest without contrast done.

## 2023-08-18 NOTE — Telephone Encounter (Signed)
 Copied from CRM [id number]. Topic: Clinical - Request for Lab/Test Order >> Aug 18, 2023  3:18 PM Maryln Sober wrote: Reason for CRM: Aden Agreste with [employer] [phone] called to request a new CT order. [employer] was advised by patient he quit smoking over 25 yrs ago. [employer] is requesting a new order for "CT chest without contrast" instead of CT lung screen Fax order back to #[phone]

## 2023-08-18 NOTE — Addendum Note (Signed)
 Addended by: Inge Mangle C on: 08/18/2023 04:56 PM   Modules accepted: Orders

## 2023-08-22 ENCOUNTER — Other Ambulatory Visit: Payer: Self-pay | Admitting: Adult Health

## 2023-08-22 ENCOUNTER — Other Ambulatory Visit

## 2023-08-22 DIAGNOSIS — R300 Dysuria: Secondary | ICD-10-CM

## 2023-08-22 LAB — PSA: PSA: 0.85 ng/mL (ref <=4.00)

## 2023-08-23 NOTE — Progress Notes (Signed)
-   PSA level norma, referral to urology was sent.

## 2023-08-30 ENCOUNTER — Encounter: Payer: Self-pay | Admitting: Adult Health

## 2023-08-31 ENCOUNTER — Encounter (HOSPITAL_COMMUNITY): Payer: Self-pay | Admitting: Student

## 2023-08-31 ENCOUNTER — Telehealth (HOSPITAL_COMMUNITY): Admitting: Student

## 2023-08-31 DIAGNOSIS — Z79899 Other long term (current) drug therapy: Secondary | ICD-10-CM

## 2023-08-31 DIAGNOSIS — F3181 Bipolar II disorder: Secondary | ICD-10-CM | POA: Diagnosis not present

## 2023-08-31 DIAGNOSIS — F4310 Post-traumatic stress disorder, unspecified: Secondary | ICD-10-CM | POA: Diagnosis not present

## 2023-08-31 DIAGNOSIS — F424 Excoriation (skin-picking) disorder: Secondary | ICD-10-CM

## 2023-08-31 DIAGNOSIS — F411 Generalized anxiety disorder: Secondary | ICD-10-CM | POA: Diagnosis not present

## 2023-08-31 DIAGNOSIS — F5105 Insomnia due to other mental disorder: Secondary | ICD-10-CM | POA: Diagnosis not present
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
## 2023-09-01 NOTE — Addendum Note (Signed)
 Addended by: Donnelly Gainer on: 09/01/2023 08:58 AM   Modules accepted: Level of Service

## 2023-09-02 ENCOUNTER — Other Ambulatory Visit

## 2023-09-13 ENCOUNTER — Ambulatory Visit: Admitting: Urology

## 2023-09-14 ENCOUNTER — Ambulatory Visit (HOSPITAL_COMMUNITY): Admitting: Student

## 2023-09-14 NOTE — Progress Notes (Unsigned)
 Called patient, no answer. Left VM to call clinic if he would like to switch to virtual visit. Patient did not return call within allotted time frame. Patient scheduled for follow-up on 6/11.  Shery Done, MD PGY-3 09/14/2023  1:15 PM [facility] Psychiatry Residency Program

## 2023-09-19 ENCOUNTER — Ambulatory Visit (HOSPITAL_COMMUNITY): Admitting: Clinical

## 2023-10-03 ENCOUNTER — Ambulatory Visit (INDEPENDENT_AMBULATORY_CARE_PROVIDER_SITE_OTHER): Admitting: Clinical

## 2023-10-03 ENCOUNTER — Encounter (HOSPITAL_COMMUNITY): Payer: Self-pay | Admitting: Clinical

## 2023-10-03 DIAGNOSIS — F4310 Post-traumatic stress disorder, unspecified: Secondary | ICD-10-CM

## 2023-10-03 DIAGNOSIS — F3181 Bipolar II disorder: Secondary | ICD-10-CM

## 2023-10-03 DIAGNOSIS — F424 Excoriation (skin-picking) disorder: Secondary | ICD-10-CM | POA: Diagnosis not present
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
## 2023-10-06 NOTE — Progress Notes (Signed)
 Name: Matthew Ortiz. DOB: May 05, 1960 MRN: [id number]  History of Present Illness: Matthew Ortiz is a 63 y.o. male who presents today for new patient visit at [facility] [place].  Relevant History includes: 1. BPH with LUTS. - Previously took Flomax  (Tamsulosin ) 0.4 mg daily but discontinue that because he saw no improvement with it. - 2020: Rezum procedure by Dr. Parke Boll.  - Reports family history of prostate cancer (father).  2. Erectile dysfunction with anejaculation. - Saw Dr. Marylou Sobers at [facility] on 09/12/2023. Switched from Sildenafil to Cialis 5 mg daily.  PSA values:  Lab Results  Component Value Date   PSA1 1.1 10/31/2017   PSA 0.85 08/22/2023   PSA 1.15 11/15/2019   Today: He reports chief complaint is urinary leakage.  Reports urge urinary incontinence. Reports stress urinary incontinence with cough/laugh/sneeze for the past several years. Also has fecal incontinence with the same activities.  Reports that the urge incontinence is predominant.  He changes his underwear about 1x/day due to leakage; not needing to wear pads or diapers at this time. Denies prior treatment(s) for urine leakage. Report intermittent nocturnal enuresis about 1x/week Reports drinking caffeine only in the mornings (coffee; about 3).   He reports urinary urgency, frequency, nocturia, and hesitancy. Reports voiding 6x/day and 1x/night. Does double voiding as needed.  Denies bladder pain or dysuria. Denies gross hematuria, straining to void, or sensations of incomplete emptying. Reports strong solid urinary stream.   Reports episodic internal penile / urethral pain at the base of his penis. The pain is described as a dull ache. This occurs about once a week and last for a few hours at a time. Started at least 6 months ago. Denies any known precipitating or exacerbating factors. Reports that heat / taking a hot shower is helpful. Has tried Tylenol  without relief.  Overall he states this issue is mild and manageable at this time.  Also reports pain with erections, however he is unable to get an erection at all at this time. Denies penile curvature with erections. So far he has not been able to start Cialis 5 mg daily due to insurance denial, which he states is in the appeal process. He reports that he is scheduled for follow up with Dr. Marylou Sobers (Reproductive Medicine specialist at [facility]) for his ED in August 2025.  Other relevant history:  - States he's on the border for OSA - has had a sleep study but has not been instructed to wear a CPAP; has been given the option to wear 2L oxygen nasal cannula overnight but not mandatory. - IBS with chronic constipation and hemorrhoids. - T2DM with neuropathy. Hemoglobin A1c was 5.5 on 04/22/2023. - Former smoker - Reports spinal stenosis   Medications: Current Outpatient Medications  Medication Sig Dispense Refill   albuterol  (VENTOLIN  HFA) 108 (90 Base) MCG/ACT inhaler INHALE 1-2 PUFFS BY MOUTH EVERY 6 HOURS AS NEEDED FOR WHEEZE OR SHORTNESS OF BREATH 8.5 each 3   amLODipine (NORVASC) 5 MG tablet Take 5 mg by mouth daily.     atorvastatin  (LIPITOR) 20 MG tablet Take 1 tablet (20 mg total) by mouth daily. 90 tablet 3   budeson-glycopyrrolate-formoterol (BREZTRI  AEROSPHERE) 160-9-4.8 MCG/ACT AERO inhaler Inhale 2 puffs into the lungs 2 (two) times daily.     dicyclomine (BENTYL) 20 MG tablet Take 20 mg by mouth 2 (two) times daily.     Fish Oil-Cholecalciferol (FISH OIL + D3 PO) Take by mouth.  fluticasone  (FLONASE ) 50 MCG/ACT nasal spray SPRAY 2 SPRAYS INTO EACH NOSTRIL EVERY DAY 48 mL 1   gabapentin  (NEURONTIN ) 100 MG capsule Take 2 capsules (200 mg total) by mouth at bedtime. 60 capsule 3   glucose blood test strip Use to test blood sugars 2 times daily. 420 each 3   indomethacin  (INDOCIN ) 25 MG capsule Take 1 capsule (25 mg total) by mouth 3 (three) times daily as needed. 30  capsule 0   lithium  carbonate 300 MG capsule Take 2 capsules (600 mg total) by mouth daily with breakfast AND 3 capsules (900 mg total) daily after supper. 450 capsule 0   losartan  (COZAAR ) 25 MG tablet Take 1 tablet (25 mg total) by mouth daily. 90 tablet 3   metFORMIN  (GLUCOPHAGE ) 500 MG tablet Take 1 tablet (500 mg total) by mouth 2 (two) times daily with a meal. 180 tablet 3   metoprolol  tartrate (LOPRESSOR ) 100 MG tablet Take 100 mg by mouth daily.     mirtazapine  (REMERON ) 45 MG tablet Take 1 tablet (45 mg total) by mouth at bedtime. 30 tablet 1   OXYGEN Place 2 L/min into the nose at bedtime.     pantoprazole (PROTONIX) 40 MG tablet Take 40 mg by mouth daily.     traZODone  (DESYREL ) 100 MG tablet TAKE 1 TABLET BY MOUTH EVERYDAY AT BEDTIME 90 tablet 1   TRUEplus Lancets 30G MISC Use to test blood sugars 2 times daily. 200 each 3   Vibegron (GEMTESA) 75 MG TABS Take 1 tablet (75 mg total) by mouth daily.     No current facility-administered medications for this visit.    Allergies: Allergies  Allergen Reactions   Bee Pollen    Milk-Related Compounds Diarrhea   Penicillins Rash    Past Medical History:  Diagnosis Date   Anxiety    Asthma    COPD (chronic obstructive pulmonary disease) (HCC)    Depression    Diabetes mellitus, type II (HCC)    GERD (gastroesophageal reflux disease)    Gout    HTN (hypertension)    IBS (irritable bowel syndrome)    Major depressive disorder in partial remission (HCC) 08/30/2017   Mixed bipolar I disorder in remission (HCC) 08/09/2022   Palpitations    Peripheral artery disease (HCC)    Past Surgical History:  Procedure Laterality Date   APPENDECTOMY  1977   FUNDOPLASTY TRANSTHORACIC  2008   HERNIA REPAIR  2014   RECONSTRUCTION OF NOSE  1978   Family History  Problem Relation Age of Onset   Depression Mother    Anxiety disorder Mother    Schizophrenia Father    Suicidality Father    Suicidality Paternal Grandfather    Drug abuse  Paternal Grandfather    Alcohol abuse Paternal Grandfather    Social History   Socioeconomic History   Marital status: Married    Spouse name: Not on file   Number of children: Not on file   Years of education: Not on file   Highest education level: Not on file  Occupational History   Not on file  Tobacco Use   Smoking status: Former    Current packs/day: 0.00    Average packs/day: 1.5 packs/day for 20.0 years (30.0 ttl pk-yrs)    Types: Cigarettes    Start date: 05/03/1978    Quit date: 05/03/1998    Years since quitting: 25.4   Smokeless tobacco: Never  Vaping Use   Vaping status: Never Used  Substance and Sexual  Activity   Alcohol use: Yes    Comment: socially-small amount of wine monthly - 1 glass    Drug use: No    Types: Marijuana    Comment: last use in 2018   Sexual activity: Not Currently    Partners: Female  Other Topics Concern   Not on file  Social History Narrative   Not on file   Social Drivers of Health   Financial Resource Strain: Not on file  Food Insecurity: Low Risk  (09/12/2023)   Received from [facility]   Hunger Vital Sign    Within the past 12 months, you worried that your food would run out before you got money to buy more: Never true    Within the past 12 months, the food you bought just didn't last and you didn't have money to get more. : Never true  Transportation Needs: No Transportation Needs (09/12/2023)   Received from [facility]    In the past 12 months, has lack of reliable transportation kept you from medical appointments, meetings, work or from getting things needed for daily living? : No  Physical Activity: Not on file  Stress: Not on file  Social Connections: Not on file  Intimate Partner Violence: Not on file    Review of Systems Constitutional: Patient denies any unintentional weight loss or change in strength ENT: Reports dry eyes; denies dry mouth lntegumentary: Patient denies any rashes or  pruritus Cardiovascular: Patient denies chest pain or syncope Respiratory: Patient denies shortness of breath Gastrointestinal: As per HPI Musculoskeletal: Patient denies muscle cramps or weakness Neurologic: Patient denies convulsions or seizures Allergic/Immunologic: Patient denies recent allergic reaction(s) Hematologic/Lymphatic: Patient denies bleeding tendencies Endocrine: Patient denies heat/cold intolerance  GU: As per HPI.  OBJECTIVE Vitals:   10/17/23 0843  BP: 100/68  Pulse: 90   There is no height or weight on file to calculate BMI.  Physical Examination Constitutional: No obvious distress; patient is non-toxic appearing  Cardiovascular: No visible lower extremity edema.  Respiratory: The patient does not have audible wheezing/stridor; respirations do not appear labored  Gastrointestinal: Abdomen non-distended Musculoskeletal: Normal ROM of UEs  Skin: No obvious rashes/open sores  Neurologic: CN 2-12 grossly intact Psychiatric: Answered questions appropriately with normal affect  Hematologic/Lymphatic/Immunologic: No obvious bruises or sites of spontaneous bleeding  UA: negative  PVR: 0 ml Uroflow: Normal pattern  ASSESSMENT Benign prostatic hyperplasia with urinary hesitancy - Plan: Urinalysis, Routine w reflex microscopic, PR COMPLEX UROFLOMETRY, BLADDER SCAN AMB NON-IMAGING  Male erectile disorder - Plan: Urinalysis, Routine w reflex microscopic, PR COMPLEX UROFLOMETRY, BLADDER SCAN AMB NON-IMAGING  Mixed stress and urge urinary incontinence - Plan: Vibegron (GEMTESA) 75 MG TABS  OAB (overactive bladder) - Plan: Vibegron (GEMTESA) 75 MG TABS  Neurogenic bladder - Plan: Vibegron (GEMTESA) 75 MG TABS  Family history of prostate cancer in father  Penile pain  Erection pain  1. BPH with urinary hesitancy. - Normal PVR; no evidence of incomplete bladder emptying. His urinary hesitancy may be related to neurogenic bladder, constipation, and/or pelvic  floor hypertonia. Discussed the importance of blood sugar management and constipation management/prevention.  2. Family history of prostate cancer (father).  - Patient PSA values have been normal.  3. OAB with urinary frequency, nocturia, urgency, urge incontinence, and nocturnal enuresis. We discussed the symptoms of overactive bladder (OAB), which include urinary urgency, frequency, nocturia, with or without urge incontinence.  While we may not know the exact etiology of OAB, several risk factors can  be identified.  - Neurogenic risk factors: T2DM, neuropathy, former nicotine use, spinal stenosis - Exacerbating factors: caffeine intake  We discussed the following management options in detail including potential benefits, risks, and side effects: Behavioral therapy: Modify fluid intake Minimize / avoid bladder irritants (such as caffeine) Bladder retraining / timed voiding Double voiding Medication(s): - Not a safe candidate for anticholinergic medications due to risk for side effects based on patient's age, comorbidities, and pre-existing dry mouth constipation  - Beta-3 agonist medications: We discussed potential side effects of beta-3 agonist medications such as urinary retention and (infrequently) elevated blood pressure.   He decided to proceed with trial of Gemtesa (Vibegron) 75 mg daily.  4. Erectile dysfunction with anejaculation. Follow up with Dr. Marylou Sobers (Reproductive Medicine specialist at [facility]) in August 2025 as planned. OK to trial Cialis (Tadalafil) 5 mg daily as planned; pending insurance coverage.  5. Painful erections. He is not getting erections at all currently so this is not a problem at this time.  6. Intermittent penile pain at rest. Manageable per patient. Will follow.  We agreed to plan for follow up in 6 weeks or sooner if needed. Patient verbalized understanding of and agreement with current plan. All questions were  answered.  PLAN Advised the following: 1. Minimize / avoid caffeine intake. 2. Gemtesa (Vibegron) 75 mg daily.  3. Return in about 6 weeks (around 11/28/2023) for UA, PVR, & f/u with Griselda Lederer NP.  Orders Placed This Encounter  Procedures   Urinalysis, Routine w reflex microscopic   PR COMPLEX UROFLOMETRY   BLADDER SCAN AMB NON-IMAGING   Total time spent caring for the patient today was over 45 minutes. This includes time spent on the date of the visit reviewing the patient's chart before the visit, time spent during the visit, and time spent after the visit on documentation. Over 50% of that time was spent in face-to-face time with this patient for direct counseling. E&M based on time and complexity of medical decision making.  It has been explained that the patient is to follow regularly with their PCP in addition to all other providers involved in their care and to follow instructions provided by these respective offices. Patient advised to contact urology clinic if any urologic-pertaining questions, concerns, new symptoms or problems arise in the interim period.  Patient Instructions  Overactive bladder (OAB) overview for patients:  Symptoms may include: urinary urgency (gotta go feeling) urinary frequency (voiding >8 times per day) night time urination (nocturia) urge incontinence of urine (UUI)  While we do not know the exact etiology of OAB, several treatment options exist including:  Behavioral therapy: Reducing fluid intake Decreasing bladder stimulants (such as caffeine) and irritants (such as acidic food, spicy foods, alcohol) Urge suppression strategies Bladder retraining via timed voiding  Pelvic floor physical therapy  Medication(s) - can use one or both of the drug classes below. Anticholinergic / antimuscarinic medications:  Mechanism of action: Activate M3 receptors to reduce detrusor stimulation and increase bladder capacity   (parasympathetic nervous  system). Effect: Relaxes the bladder to decrease overactivity, increase bladder storage capacity, and increase time between voids. Onset: Slow acting (may take 8-12 weeks to determine efficacy). Medications include: Vesicare (Solifenacin), Ditropan (Oxybutynin), Detrol (Tolterodine), Toviaz (Fesoterodine), Sanctura (Trospium), Urispas (Flavoxate), Enablex (Darifenacin), Bentyl (Dicyclomine), Levsin (Hyoscyamine ). Potential side effects include but are not limited to: Dry eyes, dry mouth, constipation, cognitive impairment, dementia risk with long term use, and urinary retention/ incomplete bladder emptying. Insurance  companies generally prefer for patients to try 1-2 anticholinergic / antimuscarinic medications first due to low cost. Some exceptions are made based on patient-specific comorbidities / risk factors. Beta-3 agonist medications: Mechanism of action: Stimulates selective B3 adrenergic receptors to cause smooth muscle bladder relaxation (sympathetic nervous system). Effect: Relaxes the bladder to decrease overactivity, increase bladder storage capacity, and increase time between voids. Onset: Slow acting (may take 8-12 weeks to determine efficacy). Medications include: Myrbetriq (Mirabegron) and Vibegron Seferino Dade). Potential side effects include but are not limited to: urinary retention / incomplete bladder emptying and elevated blood pressure (more likely to occur in individuals with pre-existing uncontrolled hypertension). These medications tend to be more expensive than the anticholinergic / antimuscarinic medications.   For patients with refractory OAB (if the above treatment options have been unsuccessful): Posterior tibial nerve stimulation (PTNS). Small acupuncture-type needle inserted near ankle with electric current to stimulate bladder via posterior tibial nerve pathway. Initially requires 12 weekly in-office treatments lasting 30 minutes each; followed by monthly in-office  treatments lasting 30 minutes each for 1 year.  Bladder Botox injections. How it is done: Typically done via in-office cystoscopy; sometimes done in the OR depending on the situation. The bladder is numbed with lidocaine instilled via a catheter. Then the urologist injects Botox into the bladder muscle wall in about 20 locations. Causes local paralysis of the bladder muscle at the injection sites to reduce bladder muscle overactivity / spasms. The effect lasts for approximately 6 months and cannot be reversed once performed. Risks may included but are not limited to: infection, incomplete bladder emptying/ urinary retention, short term need for self-catheterization or indwelling catheter, and need for repeat therapy. There is a 5-12% chance of needing to catheterize with Botox - that usually resolves in a few months as the Botox wears off. Typically Botox injections would need to be repeated every 3-12 months since this is not a permanent therapy.  Sacral neuromodulation trial (Medtronic lnterStim or Axonics implant). Sacral neuromodulation is FDA-approved for uncontrolled urinary urgency, urinary frequency, urinary urge incontinence, non-obstructive urinary retention, or fecal incontinence. It is not FDA-approved as a treatment for pain. The goal of this therapy is at least a 50% improvement in symptoms. It is NOT realistic to expect a 100% cure. This is a a 2-step outpatient procedure. After a successful test period, a permanent wire and generator are placed in the OR. We discussed the risk of infection. We reviewed the fact that about 30% of patients fail the test phase and are not candidates for permanent generator placement. During the 1-2 week trial phase, symptoms are documented by the patient to determine response. If patient gets at least a 50% improvement in symptoms, they may then proceed with Step 2. Step 1: Trial lead placement. Per physician discretion, may done one of two  ways: Percutaneous nerve evaluation (PNE) in the [facility] urology office. Performed by urologist under local anesthesia (numbing the area with lidocaine) using a spinal needle for placement of test wire, which usually stays in place for 5-7 days to determine therapy response. Test lead placement in OR under anesthesia. Usually stays in place 2 weeks to determine therapy response. > Step 2: Permanent implantation of sacral neuromodulation device, which is performed in the OR.  Sacral neuromodulation implants: All are conditionally MRI safe. Manufacturer: Medtronic Website: [employer] therapy/right-for-you.html Options: lnterStim X: Non-rechargeable. The battery lasts 10 years on average. lnterStim Micro: Rechargeable. The battery lasts 15 years on average and must be charged routinely. Approximately 50% smaller  implant than lnterStim X implant.  Manufacturer: Axonics Website: Findrealrelief.axonics.com Options: Non-rechargeable (Axonics F15): The battery lasts 15 years on average. Rechargeable (Axonics R20): The battery lasts 20 years on average and must be charged in office for about 1 hour every 6-10 months on average. Approximately 50% smaller implant than Axonics non-rechargeable implant.  Note: Generally the rechargeable devices are only advised for very small or thin patients who may not have sufficient adipose tissue to comfortably overlay the implanted device.  Suprapubic catheter (SP tube) placement. Only done in severely refractory OAB when all other options have failed or are not a viable treatment choice depending on patient factors. Involves placement of a catheter through the lower abdomen into the bladder to continuously drain the bladder into an external collection bag, which patient can then empty at their convenience every few hours. Done via an outpatient surgical procedure in the OR under  anesthesia. Risks may included but are not limited to: surgical site pain, infections, skin irritation / breakdown, chronic bacteriuria, symptomatic UTls. The SP tube must stay in place continuously. This is a reversible procedure however - the insertion site will close if catheter is removed for more than a few hours. The SP tube must be exchanged routinely every 4 weeks to prevent the catheter from becoming clogged with sediment. SP tube exchanges are typically performed at a urology nurse visit or by a home health nurse.   Electronically signed by:  Lauretta Ponto, FNP   10/17/23    10:01 AM

## 2023-10-12 ENCOUNTER — Telehealth (HOSPITAL_COMMUNITY): Admitting: Student

## 2023-10-12 ENCOUNTER — Encounter (HOSPITAL_COMMUNITY): Payer: Self-pay | Admitting: Student

## 2023-10-12 DIAGNOSIS — F4310 Post-traumatic stress disorder, unspecified: Secondary | ICD-10-CM | POA: Diagnosis not present

## 2023-10-12 DIAGNOSIS — F3181 Bipolar II disorder: Secondary | ICD-10-CM | POA: Diagnosis not present

## 2023-10-12 DIAGNOSIS — F5105 Insomnia due to other mental disorder: Secondary | ICD-10-CM | POA: Diagnosis not present

## 2023-10-12 MED ORDER — LITHIUM CARBONATE 300 MG PO CAPS: ORAL_CAPSULE | ORAL | 0 refills | Status: DC

## 2023-10-12 MED ORDER — MIRTAZAPINE 45 MG PO TABS
45.0000 mg | ORAL_TABLET | Freq: Every day | ORAL | 1 refills | Status: DC
Start: 2023-10-12 — End: 2023-12-07
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
## 2023-10-16 ENCOUNTER — Other Ambulatory Visit: Payer: Self-pay | Admitting: Adult Health

## 2023-10-16 DIAGNOSIS — J4530 Mild persistent asthma, uncomplicated: Secondary | ICD-10-CM

## 2023-10-17 ENCOUNTER — Encounter: Payer: Self-pay | Admitting: Urology

## 2023-10-17 ENCOUNTER — Ambulatory Visit (INDEPENDENT_AMBULATORY_CARE_PROVIDER_SITE_OTHER): Admitting: Urology

## 2023-10-17 VITALS — BP 100/68 | HR 90

## 2023-10-17 DIAGNOSIS — N3946 Mixed incontinence: Secondary | ICD-10-CM | POA: Diagnosis not present

## 2023-10-17 DIAGNOSIS — R3911 Hesitancy of micturition: Secondary | ICD-10-CM | POA: Diagnosis not present

## 2023-10-17 DIAGNOSIS — N3281 Overactive bladder: Secondary | ICD-10-CM

## 2023-10-17 DIAGNOSIS — N319 Neuromuscular dysfunction of bladder, unspecified: Secondary | ICD-10-CM

## 2023-10-17 DIAGNOSIS — N4830 Priapism, unspecified: Secondary | ICD-10-CM

## 2023-10-17 DIAGNOSIS — Z8042 Family history of malignant neoplasm of prostate: Secondary | ICD-10-CM | POA: Insufficient documentation

## 2023-10-17 DIAGNOSIS — N529 Male erectile dysfunction, unspecified: Secondary | ICD-10-CM

## 2023-10-17 DIAGNOSIS — N4889 Other specified disorders of penis: Secondary | ICD-10-CM

## 2023-10-17 DIAGNOSIS — N401 Enlarged prostate with lower urinary tract symptoms: Secondary | ICD-10-CM | POA: Diagnosis not present

## 2023-10-17 LAB — URINALYSIS, ROUTINE W REFLEX MICROSCOPIC
Bilirubin, UA: NEGATIVE
Glucose, UA: NEGATIVE
Ketones, UA: NEGATIVE
Leukocytes,UA: NEGATIVE
Nitrite, UA: NEGATIVE
Protein,UA: NEGATIVE
RBC, UA: NEGATIVE
Specific Gravity, UA: 1.02 (ref 1.005–1.030)
Urobilinogen, Ur: 0.2 mg/dL (ref 0.2–1.0)
pH, UA: 6.5 (ref 5.0–7.5)

## 2023-10-17 LAB — BLADDER SCAN AMB NON-IMAGING: Scan Result: 0

## 2023-10-17 MED ORDER — GEMTESA 75 MG PO TABS: 1.0000 | ORAL_TABLET | Freq: Every day | ORAL | Status: DC

## 2023-10-17 NOTE — Patient Instructions (Signed)

## 2023-10-17 NOTE — Progress Notes (Signed)
 Uroflowmetry order received. Patient voided own on with the following results:   Uroflow  Peak Flow: 45ml Average Flow: 6ml Voided Volume: Voiding Time: 61sec Flow Time: 22sec Time to Peak Flow: 1sec   Urine sent for Lab

## 2023-10-19 NOTE — Addendum Note (Signed)
 Addended by: Donnelly Gainer on: 10/19/2023 09:18 AM   Modules accepted: Level of Service

## 2023-11-03 ENCOUNTER — Encounter (HOSPITAL_COMMUNITY): Payer: Self-pay | Admitting: Clinical

## 2023-11-03 ENCOUNTER — Ambulatory Visit (INDEPENDENT_AMBULATORY_CARE_PROVIDER_SITE_OTHER): Admitting: Clinical

## 2023-11-03 ENCOUNTER — Other Ambulatory Visit: Payer: Self-pay | Admitting: Internal Medicine

## 2023-11-03 DIAGNOSIS — F3181 Bipolar II disorder: Secondary | ICD-10-CM | POA: Diagnosis not present

## 2023-11-03 DIAGNOSIS — F424 Excoriation (skin-picking) disorder: Secondary | ICD-10-CM

## 2023-11-03 DIAGNOSIS — F4310 Post-traumatic stress disorder, unspecified: Secondary | ICD-10-CM
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
## 2023-11-14 ENCOUNTER — Ambulatory Visit: Admitting: Adult Health

## 2023-11-14 ENCOUNTER — Encounter: Payer: Self-pay | Admitting: Adult Health

## 2023-11-14 VITALS — BP 118/78 | HR 62 | Temp 97.8°F | Resp 18 | Ht 73.0 in | Wt 172.8 lb

## 2023-11-14 DIAGNOSIS — K219 Gastro-esophageal reflux disease without esophagitis: Secondary | ICD-10-CM

## 2023-11-14 DIAGNOSIS — J449 Chronic obstructive pulmonary disease, unspecified: Secondary | ICD-10-CM

## 2023-11-14 DIAGNOSIS — K580 Irritable bowel syndrome with diarrhea: Secondary | ICD-10-CM | POA: Diagnosis not present

## 2023-11-14 DIAGNOSIS — N401 Enlarged prostate with lower urinary tract symptoms: Secondary | ICD-10-CM

## 2023-11-14 DIAGNOSIS — E118 Type 2 diabetes mellitus with unspecified complications: Secondary | ICD-10-CM

## 2023-11-14 DIAGNOSIS — F3181 Bipolar II disorder: Secondary | ICD-10-CM

## 2023-11-14 DIAGNOSIS — I10 Essential (primary) hypertension: Secondary | ICD-10-CM | POA: Diagnosis not present

## 2023-11-14 DIAGNOSIS — F5105 Insomnia due to other mental disorder: Secondary | ICD-10-CM

## 2023-11-14 DIAGNOSIS — N3281 Overactive bladder: Secondary | ICD-10-CM

## 2023-11-14 DIAGNOSIS — G629 Polyneuropathy, unspecified: Secondary | ICD-10-CM

## 2023-11-14 DIAGNOSIS — R3911 Hesitancy of micturition: Secondary | ICD-10-CM

## 2023-11-14 DIAGNOSIS — E785 Hyperlipidemia, unspecified: Secondary | ICD-10-CM | POA: Diagnosis not present

## 2023-11-14 NOTE — Progress Notes (Unsigned)
 [facility] clinic  Provider:  Jereld Serum DNP  Code Status:  Full Code  Goals of Care:     08/15/2023    3:10 PM  Advanced Directives  Does Patient Have a Medical Advance Directive? No  Would patient like information on creating a medical advance directive? No - Patient declined     Chief Complaint  Patient presents with   Medical Management of Chronic Issues    Return in about 3 months   Discussed the use of AI scribe software for clinical note transcription with the patient, who gave verbal consent to proceed.  HPI: Patient is a 63 y.o. male seen today for a 27-month follow up of chronic medical issues.  He has been experiencing diarrhea for a couple of months, with episodes occurring six to eight times a day, particularly worse in the morning and often after eating. The stool is described as having a cotton-like appearance in the water. He is maintaining fluid intake to compensate for the frequent bowel movements. He is currently taking Bentyl 20 mg twice a day for management. He does not have a gastroenterologist at present and has had a negative experience with a previous GI doctor during a colonoscopy four years ago.  He has a history of moderate depression and severe anxiety, for which he is under the care of a psychiatrist and a therapist. He takes Remeron  45 mg at bedtime and trazodone  100 mg at bedtime for mood and sleep issues. He reports issues with sleep, often waking up at 1-2 AM without having taken his medication.  He has hypertension, with a recent home blood pressure reading of 127/70s. He takes Norvasc 5 mg daily and metoprolol  tartrate 100 mg once a day. He reports that during a recent eye checkup, the eye doctor showed a picture of his eye and mentioned that some of the blood vessels appeared narrow.  He has type 2 diabetes with a last A1c of 5.5. He is on metformin  500 mg twice a day and reports that his blood sugar control is generally good, though she  occasionally has bad days.  She has a history of bipolar disorder and takes lithium , with a total daily dose of 1200 mg, split between morning and evening doses. She is also on Remeron  and trazodone  for mood stabilization and sleep.  He has benign prostatic hyperplasia and experiences urinary hesitancy. He is not currently taking medication for BPH but is on Gemtesa  75 mg daily for overactive bladder. He is under the care of a urologist and has a normal PSA as of Sep 12, 2023.  He has a history of acid reflux, which is generally well-controlled with pantoprazole 40 mg daily.  He has COPD and uses Breztri , two puffs twice a day. He uses oxygen at night at a rate of two liters and has a couple of tanks at home for daytime use if needed. He has not yet met his new pulmonologist.  He has a cataract in the right eye, which is not yet ready for surgery. He has a family history of cataracts, with her brother having undergone cataract surgery.  He takes atorvastatin  20 mg daily for hyperlipidemia, with recent cholesterol levels being within normal range. He also takes Flonase  for nasal symptoms and gabapentin  100 mg for neuropathy, which is stable.        Past Medical History:  Diagnosis Date   Anxiety    Asthma    COPD (chronic obstructive pulmonary disease) (HCC)  Depression    Diabetes mellitus, type II (HCC)    GERD (gastroesophageal reflux disease)    Gout    HTN (hypertension)    IBS (irritable bowel syndrome)    Major depressive disorder in partial remission (HCC) 08/30/2017   Mixed bipolar I disorder in remission (HCC) 08/09/2022   Palpitations    Peripheral artery disease (HCC)     Past Surgical History:  Procedure Laterality Date   APPENDECTOMY  1977   FUNDOPLASTY TRANSTHORACIC  2008   HERNIA REPAIR  2014   RECONSTRUCTION OF NOSE  1978    Allergies  Allergen Reactions   Bee Pollen    Milk-Related Compounds Diarrhea   Penicillins Rash    Outpatient Encounter  Medications as of 11/14/2023  Medication Sig   albuterol  (VENTOLIN  HFA) 108 (90 Base) MCG/ACT inhaler INHALE 1-2 PUFFS BY MOUTH EVERY 6 HOURS AS NEEDED FOR WHEEZE OR SHORTNESS OF BREATH   amLODipine (NORVASC) 5 MG tablet Take 5 mg by mouth daily.   atorvastatin  (LIPITOR) 20 MG tablet Take 1 tablet (20 mg total) by mouth daily.   budeson-glycopyrrolate-formoterol (BREZTRI  AEROSPHERE) 160-9-4.8 MCG/ACT AERO inhaler Inhale 2 puffs into the lungs 2 (two) times daily.   dicyclomine (BENTYL) 20 MG tablet Take 20 mg by mouth 2 (two) times daily.   Fish Oil-Cholecalciferol (FISH OIL + D3 PO) Take by mouth.   fluticasone  (FLONASE ) 50 MCG/ACT nasal spray SPRAY 2 SPRAYS INTO EACH NOSTRIL EVERY DAY   gabapentin  (NEURONTIN ) 100 MG capsule Take 2 capsules (200 mg total) by mouth at bedtime.   glucose blood test strip Use to test blood sugars 2 times daily.   indomethacin  (INDOCIN ) 25 MG capsule Take 1 capsule (25 mg total) by mouth 3 (three) times daily as needed.   lithium  carbonate 300 MG capsule Take 2 capsules (600 mg total) by mouth daily with breakfast AND 3 capsules (900 mg total) daily after supper.   losartan  (COZAAR ) 25 MG tablet Take 1 tablet (25 mg total) by mouth daily. Please advice the patient to call our office to schedule and make appt for overdue office for future refill. Thank you. 1st attempt. [phone]   metFORMIN  (GLUCOPHAGE ) 500 MG tablet Take 1 tablet (500 mg total) by mouth 2 (two) times daily with a meal.   metoprolol  tartrate (LOPRESSOR ) 100 MG tablet Take 100 mg by mouth daily.   mirtazapine  (REMERON ) 45 MG tablet Take 1 tablet (45 mg total) by mouth at bedtime.   OXYGEN Place 2 L/min into the nose at bedtime.   pantoprazole (PROTONIX) 40 MG tablet Take 40 mg by mouth daily.   traZODone  (DESYREL ) 100 MG tablet TAKE 1 TABLET BY MOUTH EVERYDAY AT BEDTIME   TRUEplus Lancets 30G MISC Use to test blood sugars 2 times daily.   Vibegron  (GEMTESA ) 75 MG TABS Take 1 tablet (75 mg total)  by mouth daily.   No facility-administered encounter medications on file as of 11/14/2023.    Review of Systems:  Review of Systems  Constitutional:  Negative for activity change, appetite change and fever.  HENT:  Negative for sore throat.   Eyes: Negative.   Cardiovascular:  Negative for chest pain and leg swelling.  Gastrointestinal:  Negative for abdominal distention, diarrhea and vomiting.  Genitourinary:  Negative for dysuria, frequency and urgency.  Skin:  Negative for color change.  Neurological:  Negative for dizziness and headaches.  Psychiatric/Behavioral:  Negative for behavioral problems and sleep disturbance. The patient is not nervous/anxious.  Health Maintenance  Topic Date Due   Pneumococcal Vaccine 57-49 Years old (2 of 2 - PCV) 07/13/2018   OPHTHALMOLOGY EXAM  07/02/2023   COVID-19 Vaccine (8 - 2024-25 season) 12/02/2023 (Originally 07/28/2023)   INFLUENZA VACCINE  12/02/2023   FOOT EXAM  12/02/2023   Diabetic kidney evaluation - Urine ACR  01/13/2024   Medicare Annual Wellness (AWV)  03/20/2024   HEMOGLOBIN A1C  05/16/2024   Diabetic kidney evaluation - eGFR measurement  11/13/2024   Colonoscopy  05/04/2027   DTaP/Tdap/Td (5 - Td or Tdap) 07/21/2033   Hepatitis B Vaccines  Completed   Hepatitis C Screening  Completed   HIV Screening  Completed   Zoster Vaccines- Shingrix  Completed   HPV VACCINES  Aged Out   Meningococcal B Vaccine  Aged Out    Physical Exam: Vitals:   11/14/23 0924  BP: 118/78  Pulse: 62  Resp: 18  Temp: 97.8 F (36.6 C)  SpO2: 97%  Weight: 172 lb 12.8 oz (78.4 kg)  Height: 6' 1 (1.854 m)   Body mass index is 22.8 kg/m. Physical Exam Constitutional:      Appearance: Normal appearance.  HENT:     Head: Normocephalic and atraumatic.     Mouth/Throat:     Mouth: Mucous membranes are moist.  Eyes:     Conjunctiva/sclera: Conjunctivae normal.  Cardiovascular:     Rate and Rhythm: Normal rate and regular rhythm.      Pulses: Normal pulses.     Heart sounds: Normal heart sounds.  Pulmonary:     Effort: Pulmonary effort is normal.     Breath sounds: Normal breath sounds.  Abdominal:     General: Bowel sounds are normal.     Palpations: Abdomen is soft.  Musculoskeletal:        General: No swelling. Normal range of motion.     Cervical back: Normal range of motion.  Skin:    General: Skin is warm and dry.  Neurological:     General: No focal deficit present.     Mental Status: He is alert and oriented to person, place, and time.  Psychiatric:        Mood and Affect: Mood normal.        Behavior: Behavior normal.        Thought Content: Thought content normal.        Judgment: Judgment normal.     Labs reviewed: Basic Metabolic Panel: Recent Labs    01/13/23 1059 08/01/23 0929 11/14/23 1045  NA 139 143 139  K 4.6 4.8 4.5  CL 106 109* 106  CO2 25 20 25   GLUCOSE 102 115* 101*  BUN 15 14 15   CREATININE 0.77 0.96 0.81  CALCIUM  9.6 9.3 9.7  TSH  --  2.270  --    Liver Function Tests: Recent Labs    01/13/23 1059 08/01/23 0929  AST 15 22  ALT 26 36  ALKPHOS  --  98  BILITOT 1.7* 1.3*  PROT 6.8 6.5  ALBUMIN  --  4.4   No results for input(s): LIPASE, AMYLASE in the last 8760 hours. No results for input(s): AMMONIA in the last 8760 hours. CBC: Recent Labs    01/13/23 1059 08/01/23 0929 11/14/23 1045  WBC 7.2 CANCELED 7.3  NEUTROABS 4,630  --  4,730  HGB 14.0 CANCELED 13.9  HCT 42.1 CANCELED 43.0  MCV 87.2  --  90.7  PLT 304 CANCELED 273   Lipid Panel: Recent Labs  04/22/23 0952 11/14/23 1045  CHOL 106 84  HDL 43 37*  LDLCALC 44 16  TRIG 105 247*  CHOLHDL 2.5 2.3   Lab Results  Component Value Date   HGBA1C 5.8 (H) 11/14/2023    Procedures since last visit: No results found.  Assessment/Plan  1. Irritable bowel syndrome with diarrhea (Primary) -  Chronic diarrhea consistent with IBS. Dissatisfaction with previous GI care. No current  gastroenterologist involvement. - Ambulatory referral to Gastroenterology  2. Essential hypertension -  Blood pressure well-controlled. Retinal artery narrowing noted. - Continue Norvasc 5 mg daily. - Continue metoprolol  tartrate 100 mg once daily. - Basic metabolic panel  3. Hyperlipidemia LDL goal <100 -  Cholesterol levels well-controlled with atorvastatin . - Continue atorvastatin  20 mg daily. - Check cholesterol levels. - Lipid panel  4. Type 2 diabetes mellitus with complication, without long-term current use of insulin (HCC) -  A1c well-controlled at 5.5%. Monitoring for potential medication adjustment. - Continue metformin  500 mg twice daily. - Hemoglobin A1C - CBC with Differential/Platelets  5. Bipolar 2 disorder, major depressive episode (HCC) -  Managed with lithium  and Remeron . Transitioning to a new psychiatrist next month. - Continue lithium  1200 mg total daily. - Continue Remeron  45 mg at bedtime.  6. Chronic obstructive pulmonary disease, unspecified COPD type (HCC) -  Managed with Breztri  inhaler and supplemental oxygen at night. Under pulmonologist care. - Continue Breztri  two puffs twice daily. - Use supplemental oxygen at 2 L at night as needed.  7. Insomnia related to another mental disorder -  Disrupted sleep pattern. Plans to discuss medication adjustment with psychiatrist. - Discuss trazodone  dosing and potential adjustments with psychiatrist.  8. Benign prostatic hyperplasia with urinary hesitancy -  Experiencing urinary hesitancy. Undergoing evaluation by a urologist. - Follow up with urologist in August for further evaluation and potential treatment.  9. OAB (overactive bladder) -  continue Gemtesa  75 mg daily  10. Gastroesophageal reflux disease without esophagitis -  stable -  continue Pantoprazole 40 mg daily  11. Neuropathy -  Symptoms unchanged with current gabapentin  regimen. - Continue gabapentin  200 mg daily.        Labs/tests  ordered:  BMP, CBC, A1C, lipid panel   Return in about 3 months (around 02/14/2024).  Derricka Mertz Medina-Vargas, NP

## 2023-11-15 ENCOUNTER — Ambulatory Visit: Payer: Self-pay | Admitting: Adult Health

## 2023-11-15 LAB — BASIC METABOLIC PANEL WITH GFR
BUN: 15 mg/dL (ref 7–25)
CO2: 25 mmol/L (ref 20–32)
Calcium: 9.7 mg/dL (ref 8.6–10.3)
Chloride: 106 mmol/L (ref 98–110)
Creat: 0.81 mg/dL (ref 0.70–1.35)
Glucose, Bld: 101 mg/dL — ABNORMAL HIGH (ref 65–99)
Potassium: 4.5 mmol/L (ref 3.5–5.3)
Sodium: 139 mmol/L (ref 135–146)
eGFR: 98 mL/min/1.73m2 (ref >=60)

## 2023-11-15 LAB — CBC WITH DIFFERENTIAL/PLATELET
Absolute Lymphocytes: 1759 {cells}/uL (ref 850–3900)
Absolute Monocytes: 642 {cells}/uL (ref 200–950)
Basophils Absolute: 44 {cells}/uL (ref 0–200)
Basophils Relative: 0.6 %
Eosinophils Absolute: 124 {cells}/uL (ref 15–500)
Eosinophils Relative: 1.7 %
HCT: 43 % (ref 38.5–50.0)
Hemoglobin: 13.9 g/dL (ref 13.2–17.1)
MCH: 29.3 pg (ref 27.0–33.0)
MCHC: 32.3 g/dL (ref 32.0–36.0)
MCV: 90.7 fL (ref 80.0–100.0)
MPV: 10.5 fL (ref 7.5–12.5)
Monocytes Relative: 8.8 %
Neutro Abs: 4730 {cells}/uL (ref 1500–7800)
Neutrophils Relative %: 64.8 %
Platelets: 273 Thousand/uL (ref 140–400)
RBC: 4.74 Million/uL (ref 4.20–5.80)
RDW: 13 % (ref 11.0–15.0)
Total Lymphocyte: 24.1 %
WBC: 7.3 Thousand/uL (ref 3.8–10.8)

## 2023-11-15 LAB — HEMOGLOBIN A1C
Hgb A1c MFr Bld: 5.8 % — ABNORMAL HIGH (ref <5.7)
Mean Plasma Glucose: 120 mg/dL
eAG (mmol/L): 6.6 mmol/L

## 2023-11-15 LAB — LIPID PANEL
Cholesterol: 84 mg/dL (ref <200)
HDL: 37 mg/dL — ABNORMAL LOW (ref >=40)
LDL Cholesterol (Calc): 16 mg/dL
Non-HDL Cholesterol (Calc): 47 mg/dL (ref <130)
Total CHOL/HDL Ratio: 2.3 (calc) (ref <5.0)
Triglycerides: 247 mg/dL — ABNORMAL HIGH (ref <150)

## 2023-11-15 NOTE — Progress Notes (Signed)
-   A1c 5.8, up from 5.5 -   Electrolytes, liver enzymes, LDL and cholesterol normal -   No anemia -    Triglycerides 247 up from 105 (6 months ago) - Will need to exercise for at least 150 minutes/minutes and increase vegetable intake -   Continue current medications

## 2023-11-17 ENCOUNTER — Encounter (INDEPENDENT_AMBULATORY_CARE_PROVIDER_SITE_OTHER): Payer: Self-pay | Admitting: *Deleted

## 2023-11-25 ENCOUNTER — Other Ambulatory Visit: Payer: Self-pay | Admitting: Internal Medicine

## 2023-11-25 NOTE — Progress Notes (Signed)
 Name: Matthew Ortiz. DOB: 1961-01-27 MRN: [id number]  History of Present Illness: Matthew Ortiz is a 63 y.o. male who presents today for follow up visit at [facility] .   At 1st visit on 10/17/2023: 1. BPH with urinary hesitancy.  - Previously took Flomax  (Tamsulosin ) 0.4 mg daily but discontinue that because he saw no improvement with it. - 2020: Rezum procedure by Matthew Ortiz.  - Normal PVR (0 ml). - Hesitancy may be related to neurogenic bladder, constipation, and/or pelvic floor hypertonia. Discussed the importance of blood sugar management and constipation management/prevention. 2. Family history of prostate cancer (father).  - Patient PSA values have been normal.  3. OAB with urinary frequency, nocturia, urgency, urge incontinence, and nocturnal enuresis. - Neurogenic risk factors: T2DM, neuropathy, former nicotine use, spinal stenosis - Exacerbating factors: caffeine intake - Not a safe candidate for anticholinergic medications due to risk for side effects based on patient's age, comorbidities, and pre-existing dry mouth constipation  - Plan was Gemtesa  trial.  4. Erectile dysfunction with anejaculation. - Follow up with Matthew Ortiz (Reproductive Medicine specialist at [facility]) in August 2025 as planned.  - OK to trial Cialis  (Tadalafil ) 5 mg daily as planned; pending insurance coverage.  5. Painful erections.  - He is not getting erections at all currently so this is not a problem at this time. 6. Intermittent penile pain at rest.  - Manageable per patient. Will follow.   Since last visit: > 11/14/2023: Normal renal function (creatinine 0.81, GFR 98).  Today: He reports symptomatic improvement since starting Gemtesa  (Vibegron ) 75 mg daily. He reports decreased urinary frequency, nocturia, urgency, and is currently not having any urge incontinence. Voiding 4x/day and 1x/night on average. Continues to have some terminal dribbling despite  doing double voiding. Not needing to wear any pads / diapers. He denies dysuria, gross hematuria, straining to void, or sensations of incomplete emptying.  He was unable to start Cialis  (Tadalafil ) 5 mg daily as previously prescribed by Matthew Ortiz because insurance would not cover that. Previously took Viagra (Sildenafil) 100 mg PRN. He states he is able to get erections but is unable to climax / ejaculate and experience temporary penile pain due to that.   He takes Nitroglycerin  PRN for chest pain and verbalizes awareness of the risk for hypotension if he were to take that around the same time as PDE-5 inhibitor medication.   He reports alternating diarrhea and constipation; states he will be seeing Gastroenterology.   Medications: Current Outpatient Medications  Medication Sig Dispense Refill   albuterol  (VENTOLIN  HFA) 108 (90 Base) MCG/ACT inhaler INHALE 1-2 PUFFS BY MOUTH EVERY 6 HOURS AS NEEDED FOR WHEEZE OR SHORTNESS OF BREATH 8.5 each 3   amLODipine (NORVASC) 5 MG tablet Take 5 mg by mouth daily.     atorvastatin  (LIPITOR) 20 MG tablet Take 1 tablet (20 mg total) by mouth daily. 90 tablet 3   budeson-glycopyrrolate-formoterol (BREZTRI  AEROSPHERE) 160-9-4.8 MCG/ACT AERO inhaler Inhale 2 puffs into the lungs 2 (two) times daily.     dicyclomine (BENTYL) 20 MG tablet Take 20 mg by mouth 2 (two) times daily.     Fish Oil-Cholecalciferol (FISH OIL + D3 PO) Take by mouth.     fluticasone  (FLONASE ) 50 MCG/ACT nasal spray SPRAY 2 SPRAYS INTO EACH NOSTRIL EVERY DAY 48 mL 1   gabapentin  (NEURONTIN ) 100 MG capsule Take 2 capsules (200 mg total) by mouth at bedtime. 60 capsule 3   glucose blood test  strip Use to test blood sugars 2 times daily. 420 each 3   indomethacin  (INDOCIN ) 25 MG capsule Take 1 capsule (25 mg total) by mouth 3 (three) times daily as needed. 30 capsule 0   lithium  carbonate 300 MG capsule Take 2 capsules (600 mg total) by mouth daily with breakfast AND 3 capsules (900 mg total)  daily after supper. 450 capsule 0   losartan  (COZAAR ) 25 MG tablet Take 1 tablet (25 mg total) by mouth daily. Please advice the patient to call our office to schedule and make appt for overdue office for future refill. Thank you. 1st attempt. [phone] 30 tablet 0   metFORMIN  (GLUCOPHAGE ) 500 MG tablet Take 1 tablet (500 mg total) by mouth 2 (two) times daily with a meal. 180 tablet 3   metoprolol  tartrate (LOPRESSOR ) 100 MG tablet Take 100 mg by mouth daily.     mirtazapine  (REMERON ) 45 MG tablet Take 1 tablet (45 mg total) by mouth at bedtime. 30 tablet 1   OXYGEN Place 2 L/min into the nose at bedtime.     pantoprazole (PROTONIX) 40 MG tablet Take 40 mg by mouth daily.     tadalafil  (CIALIS ) 5 MG tablet Take 1 tablet (5 mg total) by mouth daily. Take one 5 mg tablet once daily at approximately the same time each day without regard to timing of sexual activity. 30 tablet 11   traZODone  (DESYREL ) 100 MG tablet TAKE 1 TABLET BY MOUTH EVERYDAY AT BEDTIME 90 tablet 1   TRUEplus Lancets 30G MISC Use to test blood sugars 2 times daily. 200 each 3   Vibegron  (GEMTESA ) 75 MG TABS Take 1 tablet (75 mg total) by mouth daily. 30 tablet 11   No current facility-administered medications for this visit.    Allergies: Allergies  Allergen Reactions   Bee Pollen    Milk-Related Compounds Diarrhea   Penicillins Rash    Past Medical History:  Diagnosis Date   Anxiety    Asthma    COPD (chronic obstructive pulmonary disease) (HCC)    Depression    Diabetes mellitus, type II (HCC)    GERD (gastroesophageal reflux disease)    Gout    HTN (hypertension)    IBS (irritable bowel syndrome)    Major depressive disorder in partial remission (HCC) 08/30/2017   Mixed bipolar I disorder in remission (HCC) 08/09/2022   Palpitations    Peripheral artery disease (HCC)    Past Surgical History:  Procedure Laterality Date   APPENDECTOMY  1977   FUNDOPLASTY TRANSTHORACIC  2008   HERNIA REPAIR  2014    RECONSTRUCTION OF NOSE  1978   Family History  Problem Relation Age of Onset   Depression Mother    Anxiety disorder Mother    Schizophrenia Father    Suicidality Father    Suicidality Paternal Grandfather    Drug abuse Paternal Grandfather    Alcohol abuse Paternal Grandfather    Social History   Socioeconomic History   Marital status: Married    Spouse name: Not on file   Number of children: Not on file   Years of education: Not on file   Highest education level: Not on file  Occupational History   Not on file  Tobacco Use   Smoking status: Former    Current packs/day: 0.00    Average packs/day: 1.5 packs/day for 20.0 years (30.0 ttl pk-yrs)    Types: Cigarettes    Start date: 05/03/1978    Quit date: 05/03/1998  Years since quitting: 25.5   Smokeless tobacco: Never  Vaping Use   Vaping status: Never Used  Substance and Sexual Activity   Alcohol use: Yes    Comment: socially-small amount of wine monthly - 1 glass    Drug use: No    Types: Marijuana    Comment: last use in 2018   Sexual activity: Not Currently    Partners: Female  Other Topics Concern   Not on file  Social History Narrative   Not on file   Social Drivers of Health   Financial Resource Strain: Not on file  Food Insecurity: Low Risk  (09/12/2023)   Received from [facility]   Hunger Vital Sign    Within the past 12 months, you worried that your food would run out before you got money to buy more: Never true    Within the past 12 months, the food you bought just didn't last and you didn't have money to get more. : Never true  Transportation Needs: No Transportation Needs (09/12/2023)   Received from [facility]    In the past 12 months, has lack of reliable transportation kept you from medical appointments, meetings, work or from getting things needed for daily living? : No  Physical Activity: Not on file  Stress: Not on file  Social Connections: Not on file  Intimate  Partner Violence: Not on file    Review of Systems Constitutional: Patient denies any unintentional weight loss or change in strength lntegumentary: Patient denies any rashes or pruritus Cardiovascular: Patient denies chest pain or syncope Respiratory: Patient denies shortness of breath Gastrointestinal: As per HPI  Musculoskeletal: Patient denies muscle cramps or weakness Neurologic: Patient denies convulsions or seizures Allergic/Immunologic: Patient denies recent allergic reaction(s) Hematologic/Lymphatic: Patient denies bleeding tendencies Endocrine: Patient denies heat/cold intolerance  GU: As per HPI.  OBJECTIVE Vitals:   11/28/23 1058  BP: 114/79  Pulse: 74   There is no height or weight on file to calculate BMI.  Physical Examination Constitutional: No obvious distress; patient is non-toxic appearing  Cardiovascular: No visible lower extremity edema.  Respiratory: The patient does not have audible wheezing/stridor; respirations do not appear labored  Gastrointestinal: Abdomen non-distended Musculoskeletal: Normal ROM of UEs  Skin: No obvious rashes/open sores  Neurologic: CN 2-12 grossly intact Psychiatric: Answered questions appropriately with normal affect  Hematologic/Lymphatic/Immunologic: No obvious bruises or sites of spontaneous bleeding  UA: negative  PVR: 0 ml  ASSESSMENT Benign prostatic hyperplasia with urinary hesitancy - Plan: BLADDER SCAN AMB NON-IMAGING, Urinalysis, Routine w reflex microscopic, tadalafil  (CIALIS ) 5 MG tablet  OAB (overactive bladder) - Plan: BLADDER SCAN AMB NON-IMAGING, Urinalysis, Routine w reflex microscopic, tadalafil  (CIALIS ) 5 MG tablet, Vibegron  (GEMTESA ) 75 MG TABS  Male erectile disorder - Plan: tadalafil  (CIALIS ) 5 MG tablet  Family history of prostate cancer in father  Post-void dribbling - Plan: tadalafil  (CIALIS ) 5 MG tablet, Vibegron  (GEMTESA ) 75 MG TABS  OAB symptoms well managed with Gemtesa  (Vibegron ) 75 mg  daily.   BPH with LUTS and erectile / ejaculatory dysfunction: We agreed to trial Cialis  (Tadalafil ) 5 mg daily. He is aware that his neuropathy plays a role in his ED. Will consult with his cardiologist (Dr. Arley Pinal at [facility] in [place] ) to request cardiac clearance for PDE-5 inhibitor use, although he has used that drug class with no adverse reactions previously per history provided and he verbalized understanding not to take Nitroglycerin  at the same time.   We agreed to  plan for follow up in 3 months or sooner if needed. Patient verbalized understanding of and agreement with current plan. All questions were answered.  PLAN Advised the following: 1. Continue Gemtesa  (Vibegron ) 75 mg daily for OAB.  2. Cialis  (Tadalafil ) 5 mg daily for BPH & ED. 3. Return in about 3 months (around 02/28/2024) for with MD due to complexity.  Orders Placed This Encounter  Procedures   Urinalysis, Routine w reflex microscopic   BLADDER SCAN AMB NON-IMAGING    It has been explained that the patient is to follow regularly with their PCP in addition to all other providers involved in their care and to follow instructions provided by these respective offices. Patient advised to contact urology clinic if any urologic-pertaining questions, concerns, new symptoms or problems arise in the interim period.  There are no Patient Instructions on file for this visit.  Electronically signed by:  Lauraine JAYSON Oz, FNP   11/28/23    11:37 AM

## 2023-11-28 ENCOUNTER — Encounter: Payer: Self-pay | Admitting: Urology

## 2023-11-28 ENCOUNTER — Ambulatory Visit: Admitting: Urology

## 2023-11-28 VITALS — BP 114/79 | HR 74

## 2023-11-28 DIAGNOSIS — R3911 Hesitancy of micturition: Secondary | ICD-10-CM

## 2023-11-28 DIAGNOSIS — N529 Male erectile dysfunction, unspecified: Secondary | ICD-10-CM

## 2023-11-28 DIAGNOSIS — N3943 Post-void dribbling: Secondary | ICD-10-CM

## 2023-11-28 DIAGNOSIS — N3281 Overactive bladder: Secondary | ICD-10-CM | POA: Diagnosis not present

## 2023-11-28 DIAGNOSIS — N401 Enlarged prostate with lower urinary tract symptoms: Secondary | ICD-10-CM

## 2023-11-28 DIAGNOSIS — Z8042 Family history of malignant neoplasm of prostate: Secondary | ICD-10-CM

## 2023-11-28 DIAGNOSIS — N4889 Other specified disorders of penis: Secondary | ICD-10-CM

## 2023-11-28 DIAGNOSIS — N4830 Priapism, unspecified: Secondary | ICD-10-CM

## 2023-11-28 LAB — BLADDER SCAN AMB NON-IMAGING: Scan Result: 0

## 2023-11-28 MED ORDER — GEMTESA 75 MG PO TABS: 1.0000 | ORAL_TABLET | Freq: Every day | ORAL | 11 refills | Status: DC

## 2023-11-28 MED ORDER — TADALAFIL 5 MG PO TABS: 5.0000 mg | ORAL_TABLET | Freq: Every day | ORAL | 11 refills | Status: DC

## 2023-11-29 ENCOUNTER — Other Ambulatory Visit: Payer: Self-pay | Admitting: Internal Medicine

## 2023-11-29 LAB — URINALYSIS, ROUTINE W REFLEX MICROSCOPIC
Bilirubin, UA: NEGATIVE
Glucose, UA: NEGATIVE
Ketones, UA: NEGATIVE
Leukocytes,UA: NEGATIVE
Nitrite, UA: NEGATIVE
Protein,UA: NEGATIVE
RBC, UA: NEGATIVE
Specific Gravity, UA: 1.015 (ref 1.005–1.030)
Urobilinogen, Ur: 0.2 mg/dL (ref 0.2–1.0)
pH, UA: 6.5 (ref 5.0–7.5)

## 2023-12-01 ENCOUNTER — Encounter (HOSPITAL_COMMUNITY): Admitting: Student in an Organized Health Care Education/Training Program

## 2023-12-01 ENCOUNTER — Ambulatory Visit: Payer: Self-pay | Admitting: Internal Medicine

## 2023-12-01 ENCOUNTER — Telehealth: Payer: Self-pay

## 2023-12-01 NOTE — Telephone Encounter (Signed)
 Patient was made aware and voiced understanding.

## 2023-12-01 NOTE — Progress Notes (Signed)
 Open in error

## 2023-12-01 NOTE — Telephone Encounter (Signed)
-----   Message from Lauraine JAYSON Oz sent at 12/01/2023  4:55 PM EDT ----- Please let patient know that his cardiologist was consulted as we discussed for cardiac clearance re: Tadalafil  use and responded I have not seen him in more than a year. I cannot provide any recommendations without seeing him. He advised for patient to be seen in cardiology clinic.
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
## 2023-12-05 ENCOUNTER — Encounter (INDEPENDENT_AMBULATORY_CARE_PROVIDER_SITE_OTHER): Payer: Self-pay | Admitting: Gastroenterology

## 2023-12-05 ENCOUNTER — Ambulatory Visit (INDEPENDENT_AMBULATORY_CARE_PROVIDER_SITE_OTHER): Admitting: Gastroenterology

## 2023-12-05 VITALS — BP 118/80 | HR 79 | Temp 97.3°F | Ht 72.0 in | Wt 174.0 lb

## 2023-12-05 DIAGNOSIS — R1310 Dysphagia, unspecified: Secondary | ICD-10-CM | POA: Insufficient documentation

## 2023-12-05 DIAGNOSIS — R197 Diarrhea, unspecified: Secondary | ICD-10-CM | POA: Insufficient documentation

## 2023-12-05 DIAGNOSIS — K219 Gastro-esophageal reflux disease without esophagitis: Secondary | ICD-10-CM

## 2023-12-05 DIAGNOSIS — K580 Irritable bowel syndrome with diarrhea: Secondary | ICD-10-CM

## 2023-12-05 DIAGNOSIS — R1011 Right upper quadrant pain: Secondary | ICD-10-CM | POA: Diagnosis not present

## 2023-12-05 DIAGNOSIS — R140 Abdominal distension (gaseous): Secondary | ICD-10-CM | POA: Diagnosis not present

## 2023-12-05 DIAGNOSIS — K529 Noninfective gastroenteritis and colitis, unspecified: Secondary | ICD-10-CM | POA: Insufficient documentation

## 2023-12-05 NOTE — Progress Notes (Unsigned)
 Matthew Ortiz, M.D. Gastroenterology & Hepatology [facility] Gastroenterology [address] [place], [place] [address] Primary Care Physician: Phyllis Jereld BROCKS, NP [phone] [address] [place] [address]  Referring MD: PCP  Chief Complaint: Diarrhea  History of Present Illness: Matthew Kratochvil. is a 63 y.o. male past medical history of recurrent diverticulitis, COPD, depression, asthma, diabetes, GERD status post hiatal hernia repair and Nissen fundoplication (1998), gout, hypertension, IBS, bipolar disorder, peripheral arterial disease, who presents for evaluation of diarrhea.  Patient reports that for the last 3 months he has presented issues with diarrhea, which usually have a [place] 6, but sometimes has [place] 1-2 Bms. Drinking milk makes diarrhea even worse - does not even drink Lactaid milk. He is currently having 6-8 Bms per day. He reports frequent urgency for multiple years. He states his stools had normal consistency in the past. He has not noticed any blood in stool or melena. He has not changed his diet or added/removed any medications from his diet. He believes that he  has had similar episodes through his life intermittently. He is taking dicyclomine 20 mg at bedtime - he believes this has been given to him for diarrhea for multiple years.  He states that he has felt the rectum does not feel right, it feels sensitive. No dyschezia. He reports that he has felt this for the last 2 weeks.  He states also having pain in his RUQ/in the R subcostal area, which migrates to his back. He is having pain on a daily  basis. The pain is intermittent. Believes the pain started prior to the diarrhea onset.  He has had chronic bloating for multiple years. He states he passes gas frequently.  Possibly 4-5 years he had recurrence of GERD. He was being followed at [facility] by a gastroenterologist (Dr. Gaylia) - he was told his wrap was loose. He is  now back on Protonix 40 mg qday - he reports this controls his heartburn. States that if he drinks alcohol, spicy food , soda, or heavy meals, he will have recurrent hearrtburn. He states that sometimes he feel his food takes longer to go down and he has to raise up his arms to make it go down.  The patient denies having any nausea, vomiting, fever, chills, hematochezia, melena, hematemesis, jaundice, pruritus or weight loss.  Most recent labs from 11/14/2023 showed sodium 139, potassium 4.5, calcium  9.7, creatinine 0.81, BUN 15, normal CBC, hemoglobin A1c 5.8.  His last TSH on 08/01/2023 was 2.27.  Last EGD:3-4 years ago with Dr. Gaylia, no reports available Last Colonoscopy: Possibly 3-4 years ago with Dr. Gaylia, no reports available Had a negative Cologuard on 12/13/2019  FHx: neg for any gastrointestinal/liver disease, father colon cancer at age 62 Social: quit smoking in 2000, neg alcohol or illicit drug use Surgical: fundoplication, appendectomy and abdominal hernia repair  Past Medical History: Past Medical History:  Diagnosis Date   Anxiety    Asthma    COPD (chronic obstructive pulmonary disease) (HCC)    Depression    Diabetes mellitus, type II (HCC)    GERD (gastroesophageal reflux disease)    Gout    HTN (hypertension)    IBS (irritable bowel syndrome)    Major depressive disorder in partial remission (HCC) 08/30/2017   Mixed bipolar I disorder in remission (HCC) 08/09/2022   Palpitations    Peripheral artery disease (HCC)     Past Surgical History: Past Surgical History:  Procedure Laterality Date  APPENDECTOMY  1977   FUNDOPLASTY TRANSTHORACIC  2008   HERNIA REPAIR  2014   RECONSTRUCTION OF NOSE  1978    Family History: Family History  Problem Relation Age of Onset   Depression Mother    Anxiety disorder Mother    Schizophrenia Father    Suicidality Father    Suicidality Paternal Grandfather    Drug abuse Paternal Grandfather    Alcohol abuse Paternal  Grandfather     Social History: Social History   Tobacco Use  Smoking Status Former   Current packs/day: 0.00   Average packs/day: 1.5 packs/day for 20.0 years (30.0 ttl pk-yrs)   Types: Cigarettes   Start date: 05/03/1978   Quit date: 05/03/1998   Years since quitting: 25.6  Smokeless Tobacco Never   Social History   Substance and Sexual Activity  Alcohol Use Yes   Comment: socially-small amount of wine monthly - 1 glass    Social History   Substance and Sexual Activity  Drug Use No   Types: Marijuana   Comment: last use in 2018    Allergies: Allergies  Allergen Reactions   Bee Pollen    Milk-Related Compounds Diarrhea   Penicillins Rash    Medications: Current Outpatient Medications  Medication Sig Dispense Refill   albuterol  (VENTOLIN  HFA) 108 (90 Base) MCG/ACT inhaler INHALE 1-2 PUFFS BY MOUTH EVERY 6 HOURS AS NEEDED FOR WHEEZE OR SHORTNESS OF BREATH 8.5 each 3   amLODipine (NORVASC) 5 MG tablet Take 5 mg by mouth daily.     atorvastatin  (LIPITOR) 20 MG tablet Take 1 tablet (20 mg total) by mouth daily. 90 tablet 3   budeson-glycopyrrolate-formoterol (BREZTRI  AEROSPHERE) 160-9-4.8 MCG/ACT AERO inhaler Inhale 2 puffs into the lungs 2 (two) times daily.     dicyclomine (BENTYL) 20 MG tablet Take 20 mg by mouth 2 (two) times daily.     fluticasone  (FLONASE ) 50 MCG/ACT nasal spray SPRAY 2 SPRAYS INTO EACH NOSTRIL EVERY DAY 48 mL 1   gabapentin  (NEURONTIN ) 100 MG capsule Take 2 capsules (200 mg total) by mouth at bedtime. 60 capsule 3   glucose blood test strip Use to test blood sugars 2 times daily. 420 each 3   indomethacin  (INDOCIN ) 25 MG capsule Take 1 capsule (25 mg total) by mouth 3 (three) times daily as needed. 30 capsule 0   lithium  carbonate 300 MG capsule Take 2 capsules (600 mg total) by mouth daily with breakfast AND 3 capsules (900 mg total) daily after supper. 450 capsule 0   losartan  (COZAAR ) 25 MG tablet TAKE 1 TABLET BY MOUTH DAILY. PLEASE CALL MD  OFFICE TO MAKE APPT FOR FUTURE REFILL [phone] 15 tablet 0   metFORMIN  (GLUCOPHAGE ) 500 MG tablet Take 1 tablet (500 mg total) by mouth 2 (two) times daily with a meal. 180 tablet 3   metoprolol  tartrate (LOPRESSOR ) 100 MG tablet Take 100 mg by mouth daily.     mirtazapine  (REMERON ) 45 MG tablet Take 1 tablet (45 mg total) by mouth at bedtime. 30 tablet 1   OXYGEN Place 2 L/min into the nose at bedtime.     pantoprazole (PROTONIX) 40 MG tablet Take 40 mg by mouth daily.     tadalafil  (CIALIS ) 5 MG tablet Take 1 tablet (5 mg total) by mouth daily. Take one 5 mg tablet once daily at approximately the same time each day without regard to timing of sexual activity. 30 tablet 11   traZODone  (DESYREL ) 100 MG tablet TAKE 1 TABLET BY  MOUTH EVERYDAY AT BEDTIME 90 tablet 1   TRUEplus Lancets 30G MISC Use to test blood sugars 2 times daily. 200 each 3   Vibegron  (GEMTESA ) 75 MG TABS Take 1 tablet (75 mg total) by mouth daily. 30 tablet 11   No current facility-administered medications for this visit.    Review of Systems: GENERAL: negative for malaise, night sweats HEENT: No changes in hearing or vision, no nose bleeds or other nasal problems. NECK: Negative for lumps, goiter, pain and significant neck swelling RESPIRATORY: Negative for cough, wheezing CARDIOVASCULAR: Negative for chest pain, leg swelling, palpitations, orthopnea GI: SEE HPI MUSCULOSKELETAL: Negative for joint pain or swelling, back pain, and muscle pain. SKIN: Negative for lesions, rash PSYCH: Negative for sleep disturbance, mood disorder and recent psychosocial stressors. HEMATOLOGY Negative for prolonged bleeding, bruising easily, and swollen nodes. ENDOCRINE: Negative for cold or heat intolerance, polyuria, polydipsia and goiter. NEURO: negative for tremor, gait imbalance, syncope and seizures. The remainder of the review of systems is noncontributory.   Physical Exam: BP 118/80 (BP Location: Left Arm, Patient Position:  Sitting, Cuff Size: Normal)   Pulse 79   Temp (!) 97.3 F (36.3 C) (Temporal)   Ht 6' (1.829 m)   Wt 174 lb (78.9 kg)   BMI 23.60 kg/m  GENERAL: The patient is AO x3, in no acute distress. HEENT: Head is normocephalic and atraumatic. EOMI are intact. Mouth is well hydrated and without lesions. NECK: Supple. No masses LUNGS: Clear to auscultation. No presence of rhonchi/wheezing/rales. Adequate chest expansion HEART: RRR, normal s1 and s2. ABDOMEN: Tender to palpation in the right upper quadrant, no guarding, no peritoneal signs, and nondistended. BS +. No masses. EXTREMITIES: Without any cyanosis, clubbing, rash, lesions or edema. NEUROLOGIC: AOx3, no focal motor deficit. SKIN: no jaundice, no rashes   Imaging/Labs: as above  I personally reviewed and interpreted the available labs, imaging and endoscopic files.  Impression and Plan: Matthew Mckey. is a 63 y.o. male past medical history of recurrent diverticulitis, COPD, depression, asthma, diabetes, GERD status post hiatal hernia repair and Nissen fundoplication (1998), gout, hypertension, IBS, bipolar disorder, peripheral arterial disease, who presents for evaluation of diarrhea.  Patient has presented chronic fluctuation of his bowel movement frequency, with recent exacerbation of his symptoms and frequent diarrhea recently.  It is unclear if he has had any recent triggers such as infection, for which stool testing will be performed.  Nevertheless, he may have underlying IBS-D leading to his chronic fluctuating symptoms.  We also check for celiac disease with serology.  Depending on results, he will need to have a CT of the abdomen and pelvis with IV contrast and colonoscopy.  -Patient has also presented issues with chronic heartburn and bloating, will recent presence of dysphagia.  Ideally, this should be evaluated with an EGD but he would like to hold off on these until he is ready to schedule his colonoscopy.  He should continue  taking pantoprazole 40 mg every day for now.  Finally, as he had extensive investigations in the past at previous gastroenterologist, we will request the records from Dr. Ericka office.  -Check celiac disease panel, GI pathogen panel, C. difficile and ova and parasite in stool -If investigations are negative, we will proceed with CT abdomen/pelvis with IV contrast and colonoscopy.  He would like to have his EGD scheduled at the same time of colonoscopy. -Hold off on starting antidiarrheals for now until stool testing is back -Continue pantoprazole 40 mg every day -We will  request medical records from Dr. Ericka office  All questions were answered.      Matthew Fortune, MD Gastroenterology and Hepatology [facility]

## 2023-12-05 NOTE — Patient Instructions (Addendum)
 Perform blood and stool workup If investigations are negative, we will proceed with CT abdomen/pelvis with IV contrast and colonoscopy.   Hold off on starting antidiarrheals for now until stool testing is back Continue pantoprazole 40 mg every day We will request medical records from Dr. Ericka office

## 2023-12-06 ENCOUNTER — Ambulatory Visit (HOSPITAL_COMMUNITY): Admitting: Clinical

## 2023-12-06 ENCOUNTER — Encounter (HOSPITAL_COMMUNITY): Payer: Self-pay | Admitting: Clinical

## 2023-12-06 DIAGNOSIS — F5105 Insomnia due to other mental disorder: Secondary | ICD-10-CM

## 2023-12-06 DIAGNOSIS — F3181 Bipolar II disorder: Secondary | ICD-10-CM | POA: Diagnosis not present

## 2023-12-06 DIAGNOSIS — F424 Excoriation (skin-picking) disorder: Secondary | ICD-10-CM | POA: Diagnosis not present

## 2023-12-06 DIAGNOSIS — F4310 Post-traumatic stress disorder, unspecified: Secondary | ICD-10-CM

## 2023-12-06 DIAGNOSIS — F411 Generalized anxiety disorder: Secondary | ICD-10-CM

## 2023-12-06 LAB — CELIAC DISEASE PANEL
(tTG) Ab, IgA: <1 U/mL
(tTG) Ab, IgG: <1 U/mL
Gliadin IgA: <1 U/mL
Gliadin IgG: <1 U/mL
Immunoglobulin A: 175 mg/dL (ref 70–320)
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
## 2023-12-07 ENCOUNTER — Ambulatory Visit (HOSPITAL_BASED_OUTPATIENT_CLINIC_OR_DEPARTMENT_OTHER): Admitting: Student in an Organized Health Care Education/Training Program

## 2023-12-07 ENCOUNTER — Encounter (HOSPITAL_COMMUNITY): Payer: Self-pay | Admitting: Student in an Organized Health Care Education/Training Program

## 2023-12-07 ENCOUNTER — Other Ambulatory Visit: Payer: Self-pay | Admitting: Internal Medicine

## 2023-12-07 ENCOUNTER — Ambulatory Visit (HOSPITAL_COMMUNITY): Admitting: Student in an Organized Health Care Education/Training Program

## 2023-12-07 VITALS — BP 122/81 | HR 79 | Ht 72.0 in | Wt 174.0 lb

## 2023-12-07 DIAGNOSIS — F5101 Primary insomnia: Secondary | ICD-10-CM

## 2023-12-07 DIAGNOSIS — F3181 Bipolar II disorder: Secondary | ICD-10-CM

## 2023-12-07 DIAGNOSIS — G629 Polyneuropathy, unspecified: Secondary | ICD-10-CM

## 2023-12-07 DIAGNOSIS — F4310 Post-traumatic stress disorder, unspecified: Secondary | ICD-10-CM

## 2023-12-07 MED ORDER — HYDROXYZINE HCL 25 MG PO TABS
25.0000 mg | ORAL_TABLET | Freq: Three times a day (TID) | ORAL | 1 refills | Status: DC | PRN
Start: 2023-12-07 — End: 2024-01-11

## 2023-12-07 MED ORDER — TRAZODONE HCL 100 MG PO TABS: 100.0000 mg | ORAL_TABLET | Freq: Every day | ORAL | 1 refills | Status: DC

## 2023-12-07 MED ORDER — LITHIUM CARBONATE 300 MG PO CAPS: ORAL_CAPSULE | ORAL | 0 refills | Status: DC

## 2023-12-07 MED ORDER — GABAPENTIN 100 MG PO CAPS: 200.0000 mg | ORAL_CAPSULE | Freq: Every day | ORAL | 3 refills | Status: DC

## 2023-12-13 ENCOUNTER — Other Ambulatory Visit: Payer: Self-pay | Admitting: Internal Medicine

## 2023-12-13 LAB — OVA AND PARASITE EXAMINATION
CONCENTRATE RESULT:: NONE SEEN
MICRO NUMBER:: 16801685
SPECIMEN QUALITY:: ADEQUATE
TRICHROME RESULT:: NONE SEEN

## 2023-12-13 LAB — C. DIFFICILE GDH AND TOXIN A/B
GDH ANTIGEN: DETECTED
MICRO NUMBER:: 16802499
SPECIMEN QUALITY:: ADEQUATE
TOXIN A AND B: NOT DETECTED

## 2023-12-13 LAB — GASTROINTESTINAL PATHOGEN PNL

## 2023-12-13 LAB — CLOSTRIDIUM DIFFICILE TOXIN B, QUALITATIVE, REAL-TIME PCR: Toxigenic C. Difficile by PCR: NOT DETECTED

## 2023-12-19 ENCOUNTER — Ambulatory Visit: Payer: Self-pay | Admitting: Gastroenterology

## 2023-12-20 ENCOUNTER — Other Ambulatory Visit (INDEPENDENT_AMBULATORY_CARE_PROVIDER_SITE_OTHER): Payer: Self-pay

## 2023-12-20 DIAGNOSIS — R197 Diarrhea, unspecified: Secondary | ICD-10-CM

## 2023-12-20 DIAGNOSIS — R1011 Right upper quadrant pain: Secondary | ICD-10-CM

## 2023-12-22 ENCOUNTER — Other Ambulatory Visit: Payer: Self-pay | Admitting: Internal Medicine

## 2023-12-27 ENCOUNTER — Ambulatory Visit (INDEPENDENT_AMBULATORY_CARE_PROVIDER_SITE_OTHER): Admitting: Clinical

## 2023-12-27 ENCOUNTER — Encounter (HOSPITAL_COMMUNITY): Payer: Self-pay | Admitting: Clinical

## 2023-12-27 DIAGNOSIS — F411 Generalized anxiety disorder: Secondary | ICD-10-CM

## 2023-12-27 DIAGNOSIS — F424 Excoriation (skin-picking) disorder: Secondary | ICD-10-CM | POA: Diagnosis not present

## 2023-12-27 DIAGNOSIS — F3181 Bipolar II disorder: Secondary | ICD-10-CM | POA: Diagnosis not present

## 2023-12-27 DIAGNOSIS — F4310 Post-traumatic stress disorder, unspecified: Secondary | ICD-10-CM
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
## 2024-01-06 ENCOUNTER — Ambulatory Visit: Payer: Self-pay | Admitting: Gastroenterology

## 2024-01-06 ENCOUNTER — Other Ambulatory Visit (INDEPENDENT_AMBULATORY_CARE_PROVIDER_SITE_OTHER): Payer: Self-pay

## 2024-01-06 ENCOUNTER — Encounter (INDEPENDENT_AMBULATORY_CARE_PROVIDER_SITE_OTHER): Payer: Self-pay

## 2024-01-06 DIAGNOSIS — R197 Diarrhea, unspecified: Secondary | ICD-10-CM

## 2024-01-06 LAB — GASTROINTESTINAL PATHOGEN PNL
CampyloBacter Group: NOT DETECTED
Norovirus GI/GII: NOT DETECTED
Rotavirus A: NOT DETECTED
Salmonella species: NOT DETECTED
Shiga Toxin 1: NOT DETECTED
Shiga Toxin 2: NOT DETECTED
Shigella Species: NOT DETECTED
Vibrio Group: NOT DETECTED
Yersinia enterocolitica: NOT DETECTED

## 2024-01-06 MED ORDER — PEG 3350-KCL-NA BICARB-NACL 420 G PO SOLR: 4000.0000 mL | Freq: Once | ORAL | 0 refills | Status: AC

## 2024-01-09 NOTE — Telephone Encounter (Signed)
 Called pt to give pre-op appt and CT appt. No answer, lvm.

## 2024-01-09 NOTE — Patient Instructions (Signed)
 Matthew Ortiz.  01/09/2024     @PREFPERIOPPHARMACY @   Your procedure is scheduled on  01/12/2024.   Report to [facility] at  0815  A.M.   Call this number if you have problems the morning of surgery:  [phone]  If you experience any cold or flu symptoms such as cough, fever, chills, shortness of breath, etc. between now and your scheduled surgery, please notify us  at the above number.   Remember:  Follow the diet and prep instructions given to you by  the office.       Use your inhaler before you come and bring your rescue inhaler with you.        DO NOT take any medications for diabetes the morning of your procedure.    You may drink clear liquids until 0615 am on 01/12/2024.    Clear liquids allowed are:                    Water, Juice (No red color; non-citric and without pulp; diabetics please choose diet or no sugar options), Carbonated beverages (diabetics please choose diet or no sugar options), Clear Tea (No creamer, milk, or cream, including half & half and powdered creamer), Black Coffee Only (No creamer, milk or cream, including half & half and powdered creamer), and Clear Sports drink (No red color; diabetics please choose diet or no sugar options)    Take these medicines the morning of surgery with A SIP OF WATER            amlodipine, hydroxyzine , lithium , metoprolol , pantoprazole.    Do not wear jewelry, make-up or nail polish, including gel polish,  artificial nails, or any other type of covering on natural nails (fingers and  toes).  Do not wear lotions, powders, or perfumes, or deodorant.  Do not shave 48 hours prior to surgery.  Men may shave face and neck.  Do not bring valuables to the hospital.  [facility] is not responsible for any belongings or valuables.  Contacts, dentures or bridgework may not be worn into surgery.  Leave your suitcase in the car.  After surgery it may be brought to your room.  For patients admitted to the  hospital, discharge time will be determined by your treatment team.  Patients discharged the day of surgery will not be allowed to drive home and must have someone with them for 24 hours.    Special instructions:  DO NOT smoke tobacco or vape for 24 hours before your procedure.  Please read over the following fact sheets that you were given. Anesthesia Post-op Instructions and Care and Recovery After Surgery      Upper Endoscopy, Adult, Care After After the procedure, it is common to have a sore throat. It is also common to have: Mild stomach pain or discomfort. Bloating. Nausea. Follow these instructions at home: The instructions below may help you care for yourself at home. Your health care provider may give you more instructions. If you have questions, ask your health care provider. If you were given a sedative during the procedure, it can affect you for several hours. Do not drive or operate machinery until your health care provider says that it is safe. If you will be going home right after the procedure, plan to have a responsible adult: Take you home from the hospital or clinic. You will not be allowed to drive. Care for you for the time you  are told. Follow instructions from your health care provider about what you may eat and drink. Return to your normal activities as told by your health care provider. Ask your health care provider what activities are safe for you. Take over-the-counter and prescription medicines only as told by your health care provider. Contact a health care provider if you: Have a sore throat that lasts longer than one day. Have trouble swallowing. Have a fever. Get help right away if you: Vomit blood or your vomit looks like coffee grounds. Have bloody, black, or tarry stools. Have a very bad sore throat or you cannot swallow. Have difficulty breathing or very bad pain in your chest or abdomen. These symptoms may be an emergency. Get help right away.  Call 911. Do not wait to see if the symptoms will go away. Do not drive yourself to the hospital. Summary After the procedure, it is common to have a sore throat, mild stomach discomfort, bloating, and nausea. If you were given a sedative during the procedure, it can affect you for several hours. Do not drive until your health care provider says that it is safe. Follow instructions from your health care provider about what you may eat and drink. Return to your normal activities as told by your health care provider. This information is not intended to replace advice given to you by your health care provider. Make sure you discuss any questions you have with your health care provider. Document Revised: 07/29/2021 Document Reviewed: 07/29/2021 Elsevier Patient Education  2024 [employer]Colonoscopy, Adult, Care After The following information offers guidance on how to care for yourself after your procedure. Your health care provider may also give you more specific instructions. If you have problems or questions, contact your health care provider. What can I expect after the procedure? After the procedure, it is common to have: A small amount of blood in your stool for 24 hours after the procedure. Some gas. Mild cramping or bloating of your abdomen. Follow these instructions at home: Eating and drinking  Drink enough fluid to keep your urine pale yellow. Follow instructions from your health care provider about eating or drinking restrictions. Resume your normal diet as told by your health care provider. Avoid heavy or fried foods that are hard to digest. Activity Rest as told by your health care provider. Avoid sitting for a long time without moving. Get up to take short walks every 1-2 hours. This is important to improve blood flow and breathing. Ask for help if you feel weak or unsteady. Return to your normal activities as told by your health care provider. Ask your health care provider  what activities are safe for you. Managing cramping and bloating  Try walking around when you have cramps or feel bloated. If directed, apply heat to your abdomen as told by your health care provider. Use the heat source that your health care provider recommends, such as a moist heat pack or a heating pad. Place a towel between your skin and the heat source. Leave the heat on for 20-30 minutes. Remove the heat if your skin turns bright red. This is especially important if you are unable to feel pain, heat, or cold. You have a greater risk of getting burned. General instructions If you were given a sedative during the procedure, it can affect you for several hours. Do not drive or operate machinery until your health care provider says that it is safe. For the first 24 hours after the procedure: Do  not sign important documents. Do not drink alcohol. Do your regular daily activities at a slower pace than normal. Eat soft foods that are easy to digest. Take over-the-counter and prescription medicines only as told by your health care provider. Keep all follow-up visits. This is important. Contact a health care provider if: You have blood in your stool 2-3 days after the procedure. Get help right away if: You have more than a small spotting of blood in your stool. You have large blood clots in your stool. You have swelling of your abdomen. You have nausea or vomiting. You have a fever. You have increasing pain in your abdomen that is not relieved with medicine. These symptoms may be an emergency. Get help right away. Call 911. Do not wait to see if the symptoms will go away. Do not drive yourself to the hospital. Summary After the procedure, it is common to have a small amount of blood in your stool. You may also have mild cramping and bloating of your abdomen. If you were given a sedative during the procedure, it can affect you for several hours. Do not drive or operate machinery until your  health care provider says that it is safe. Get help right away if you have a lot of blood in your stool, nausea or vomiting, a fever, or increased pain in your abdomen. This information is not intended to replace advice given to you by your health care provider. Make sure you discuss any questions you have with your health care provider. Document Revised: 06/01/2022 Document Reviewed: 12/10/2020 Elsevier Patient Education  2024 [employer]General Anesthesia, Adult, Care After The following information offers guidance on how to care for yourself after your procedure. Your health care provider may also give you more specific instructions. If you have problems or questions, contact your health care provider. What can I expect after the procedure? After the procedure, it is common for people to: Have pain or discomfort at the IV site. Have nausea or vomiting. Have a sore throat or hoarseness. Have trouble concentrating. Feel cold or chills. Feel weak, sleepy, or tired (fatigue). Have soreness and body aches. These can affect parts of the body that were not involved in surgery. Follow these instructions at home: For the time period you were told by your health care provider:  Rest. Do not participate in activities where you could fall or become injured. Do not drive or use machinery. Do not drink alcohol. Do not take sleeping pills or medicines that cause drowsiness. Do not make important decisions or sign legal documents. Do not take care of children on your own. General instructions Drink enough fluid to keep your urine pale yellow. If you have sleep apnea, surgery and certain medicines can increase your risk for breathing problems. Follow instructions from your health care provider about wearing your sleep device: Anytime you are sleeping, including during daytime naps. While taking prescription pain medicines, sleeping medicines, or medicines that make you drowsy. Return to your  normal activities as told by your health care provider. Ask your health care provider what activities are safe for you. Take over-the-counter and prescription medicines only as told by your health care provider. Do not use any products that contain nicotine or tobacco. These products include cigarettes, chewing tobacco, and vaping devices, such as e-cigarettes. These can delay incision healing after surgery. If you need help quitting, ask your health care provider. Contact a health care provider if: You have nausea or vomiting that does not get  better with medicine. You vomit every time you eat or drink. You have pain that does not get better with medicine. You cannot urinate or have bloody urine. You develop a skin rash. You have a fever. Get help right away if: You have trouble breathing. You have chest pain. You vomit blood. These symptoms may be an emergency. Get help right away. Call 911. Do not wait to see if the symptoms will go away. Do not drive yourself to the hospital. Summary After the procedure, it is common to have a sore throat, hoarseness, nausea, vomiting, or to feel weak, sleepy, or fatigue. For the time period you were told by your health care provider, do not drive or use machinery. Get help right away if you have difficulty breathing, have chest pain, or vomit blood. These symptoms may be an emergency. This information is not intended to replace advice given to you by your health care provider. Make sure you discuss any questions you have with your health care provider. Document Revised: 07/17/2021 Document Reviewed: 07/17/2021 Elsevier Patient Education  2024 [employer].

## 2024-01-10 ENCOUNTER — Encounter (HOSPITAL_COMMUNITY)
Admission: RE | Admit: 2024-01-10 | Discharge: 2024-01-10 | Disposition: A | Discharge status: Home / Self Care | Source: Ambulatory Visit | Attending: Gastroenterology | Admitting: Gastroenterology

## 2024-01-10 ENCOUNTER — Encounter (HOSPITAL_COMMUNITY): Payer: Self-pay

## 2024-01-10 VITALS — BP 119/82 | HR 65 | Temp 97.8°F | Resp 18 | Ht 72.0 in | Wt 173.9 lb

## 2024-01-10 DIAGNOSIS — I10 Essential (primary) hypertension: Secondary | ICD-10-CM | POA: Insufficient documentation

## 2024-01-10 DIAGNOSIS — Z01810 Encounter for preprocedural cardiovascular examination: Secondary | ICD-10-CM | POA: Insufficient documentation

## 2024-01-10 DIAGNOSIS — R9431 Abnormal electrocardiogram [ECG] [EKG]: Secondary | ICD-10-CM | POA: Diagnosis not present

## 2024-01-11 ENCOUNTER — Encounter (HOSPITAL_COMMUNITY): Payer: Self-pay | Admitting: Student in an Organized Health Care Education/Training Program

## 2024-01-11 ENCOUNTER — Telehealth (HOSPITAL_BASED_OUTPATIENT_CLINIC_OR_DEPARTMENT_OTHER): Admitting: Student in an Organized Health Care Education/Training Program

## 2024-01-11 DIAGNOSIS — F5101 Primary insomnia: Secondary | ICD-10-CM

## 2024-01-11 DIAGNOSIS — F3181 Bipolar II disorder: Secondary | ICD-10-CM | POA: Diagnosis not present

## 2024-01-11 MED ORDER — TRAZODONE HCL 100 MG PO TABS: 100.0000 mg | ORAL_TABLET | Freq: Every day | ORAL | 1 refills | Status: DC

## 2024-01-11 MED ORDER — LITHIUM CARBONATE 300 MG PO CAPS: ORAL_CAPSULE | ORAL | 0 refills | Status: DC

## 2024-01-11 MED ORDER — HYDROXYZINE HCL 10 MG PO TABS: 10.0000 mg | ORAL_TABLET | Freq: Every day | ORAL | 0 refills | Status: DC

## 2024-01-11 NOTE — Addendum Note (Signed)
 Addended by: CARVIN CROCK on: 01/11/2024 02:22 PM   Modules accepted: Level of Service

## 2024-01-11 NOTE — Addendum Note (Signed)
 Addended by: CARVIN CROCK on: 01/11/2024 04:42 PM   Modules accepted: Level of Service
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
## 2024-01-12 ENCOUNTER — Ambulatory Visit (HOSPITAL_BASED_OUTPATIENT_CLINIC_OR_DEPARTMENT_OTHER)

## 2024-01-12 ENCOUNTER — Ambulatory Visit (HOSPITAL_COMMUNITY)

## 2024-01-12 ENCOUNTER — Encounter (HOSPITAL_COMMUNITY): Payer: Self-pay | Admitting: Gastroenterology

## 2024-01-12 ENCOUNTER — Encounter (HOSPITAL_COMMUNITY)
Admission: RE | Disposition: A | Discharge status: Home / Self Care | Payer: Self-pay | Source: Home / Self Care | Attending: Gastroenterology

## 2024-01-12 ENCOUNTER — Ambulatory Visit (HOSPITAL_COMMUNITY)
Admission: RE | Admit: 2024-01-12 | Discharge: 2024-01-12 | Disposition: A | Discharge status: Home / Self Care | Attending: Gastroenterology | Admitting: Gastroenterology

## 2024-01-12 ENCOUNTER — Encounter (INDEPENDENT_AMBULATORY_CARE_PROVIDER_SITE_OTHER): Payer: Self-pay | Admitting: *Deleted

## 2024-01-12 DIAGNOSIS — Z87891 Personal history of nicotine dependence: Secondary | ICD-10-CM

## 2024-01-12 DIAGNOSIS — E1151 Type 2 diabetes mellitus with diabetic peripheral angiopathy without gangrene: Secondary | ICD-10-CM | POA: Insufficient documentation

## 2024-01-12 DIAGNOSIS — J4489 Other specified chronic obstructive pulmonary disease: Secondary | ICD-10-CM | POA: Insufficient documentation

## 2024-01-12 DIAGNOSIS — K625 Hemorrhage of anus and rectum: Secondary | ICD-10-CM | POA: Diagnosis not present

## 2024-01-12 DIAGNOSIS — K589 Irritable bowel syndrome without diarrhea: Secondary | ICD-10-CM | POA: Insufficient documentation

## 2024-01-12 DIAGNOSIS — Z7984 Long term (current) use of oral hypoglycemic drugs: Secondary | ICD-10-CM | POA: Insufficient documentation

## 2024-01-12 DIAGNOSIS — I10 Essential (primary) hypertension: Secondary | ICD-10-CM | POA: Diagnosis not present

## 2024-01-12 DIAGNOSIS — F419 Anxiety disorder, unspecified: Secondary | ICD-10-CM | POA: Insufficient documentation

## 2024-01-12 DIAGNOSIS — K648 Other hemorrhoids: Secondary | ICD-10-CM | POA: Diagnosis not present

## 2024-01-12 DIAGNOSIS — M1990 Unspecified osteoarthritis, unspecified site: Secondary | ICD-10-CM | POA: Insufficient documentation

## 2024-01-12 DIAGNOSIS — J449 Chronic obstructive pulmonary disease, unspecified: Secondary | ICD-10-CM | POA: Diagnosis not present

## 2024-01-12 DIAGNOSIS — F319 Bipolar disorder, unspecified: Secondary | ICD-10-CM | POA: Insufficient documentation

## 2024-01-12 DIAGNOSIS — K529 Noninfective gastroenteritis and colitis, unspecified: Secondary | ICD-10-CM | POA: Insufficient documentation

## 2024-01-12 DIAGNOSIS — K2100 Gastro-esophageal reflux disease with esophagitis, without bleeding: Secondary | ICD-10-CM | POA: Insufficient documentation

## 2024-01-12 DIAGNOSIS — R1310 Dysphagia, unspecified: Secondary | ICD-10-CM

## 2024-01-12 DIAGNOSIS — M109 Gout, unspecified: Secondary | ICD-10-CM | POA: Diagnosis not present

## 2024-01-12 DIAGNOSIS — K3189 Other diseases of stomach and duodenum: Secondary | ICD-10-CM

## 2024-01-12 DIAGNOSIS — K209 Esophagitis, unspecified without bleeding: Secondary | ICD-10-CM

## 2024-01-12 DIAGNOSIS — Z9889 Other specified postprocedural states: Secondary | ICD-10-CM | POA: Diagnosis not present

## 2024-01-12 HISTORY — PX: ESOPHAGOGASTRODUODENOSCOPY: SHX5428

## 2024-01-12 HISTORY — PX: COLONOSCOPY: SHX5424

## 2024-01-12 LAB — HM COLONOSCOPY

## 2024-01-12 LAB — GLUCOSE, CAPILLARY: Glucose-Capillary: 106 mg/dL — ABNORMAL HIGH (ref 70–99)

## 2024-01-12 SURGERY — COLONOSCOPY
Anesthesia: General

## 2024-01-12 MED ORDER — PHENYLEPHRINE 80 MCG/ML (10ML) SYRINGE FOR IV PUSH (FOR BLOOD PRESSURE SUPPORT)
PREFILLED_SYRINGE | INTRAVENOUS | Status: DC | PRN
  Administered 2024-01-12 (×2): 160 ug via INTRAVENOUS
  Administered 2024-01-12: 80 ug via INTRAVENOUS
  Administered 2024-01-12 (×6): 160 ug via INTRAVENOUS
  Administered 2024-01-12: 80 ug via INTRAVENOUS

## 2024-01-12 MED ORDER — LACTATED RINGERS IV SOLN: INTRAVENOUS | Status: DC

## 2024-01-12 MED ORDER — EPHEDRINE SULFATE-NACL 50-0.9 MG/10ML-% IV SOSY
PREFILLED_SYRINGE | INTRAVENOUS | Status: DC | PRN
  Administered 2024-01-12: 10 mg via INTRAVENOUS

## 2024-01-12 MED ORDER — PROPOFOL 10 MG/ML IV BOLUS
INTRAVENOUS | Status: DC | PRN
  Administered 2024-01-12: 30 mg via INTRAVENOUS
  Administered 2024-01-12: 125 ug/kg/min via INTRAVENOUS
  Administered 2024-01-12: 30 mg via INTRAVENOUS
  Administered 2024-01-12: 80 mg via INTRAVENOUS

## 2024-01-12 MED ORDER — LIDOCAINE 2% (20 MG/ML) 5 ML SYRINGE
INTRAMUSCULAR | Status: DC | PRN
  Administered 2024-01-12: 100 mg via INTRAVENOUS

## 2024-01-12 MED ORDER — VASOPRESSIN 20 UNIT/ML IV SOLN
INTRAVENOUS | Status: DC | PRN
  Administered 2024-01-12 (×2): 1 [IU] via INTRAVENOUS

## 2024-01-12 NOTE — Op Note (Signed)
 [facility] Patient Name: Rockne Dearinger Procedure Date: 01/12/2024 9:48 AM MRN: [id number] Date of Birth: Sep 26, 1960 Attending MD: Toribio Fortune , , [id number] CSN: [id number] Age: 63 Admit Type: Outpatient Procedure:                Colonoscopy Indications:              Chronic diarrhea Providers:                Toribio Fortune, Madelin Hunter, RN, Bascom Blush Referring MD:              Medicines:                Monitored Anesthesia Care Complications:            No immediate complications. Estimated Blood Loss:     Estimated blood loss: none. Procedure:                Pre-Anesthesia Assessment:                           - Prior to the procedure, a History and Physical                            was performed, and patient medications, allergies                            and sensitivities were reviewed. The patient's                            tolerance of previous anesthesia was reviewed.                           - The risks and benefits of the procedure and the                            sedation options and risks were discussed with the                            patient. All questions were answered and informed                            consent was obtained.                           - ASA Grade Assessment: II - A patient with mild                            systemic disease.                           After obtaining informed consent, the colonoscope                            was passed under direct vision. Throughout the                            procedure, the patient's blood pressure, pulse, and  oxygen saturations were monitored continuously. The                            [id number] ([id number]) Peds Colon was introduced                            through the anus and advanced to the the cecum,                            identified by appendiceal orifice and ileocecal                            valve. The colonoscopy was performed without                             difficulty. The patient tolerated the procedure                            well. Scope In: 10:27:18 AM Scope Out: 10:49:32 AM Scope Withdrawal Time: 0 hours 11 minutes 37 seconds  Total Procedure Duration: 0 hours 22 minutes 14 seconds  Findings:      The perianal and digital rectal examinations were normal.      The colon (entire examined portion) appeared normal. Biopsies for       histology were taken with a cold forceps from the right colon and left       colon for evaluation of microscopic colitis.      Non-bleeding internal hemorrhoids were found during retroflexion. The       hemorrhoids were medium-sized. Impression:               - The entire examined colon is normal. Biopsied.                           - Non-bleeding internal hemorrhoids. Moderate Sedation:      Per Anesthesia Care Recommendation:           - Discharge patient to home (ambulatory).                           - Resume previous diet.                           - Await pathology results.                           - Repeat colonoscopy for surveillance based on                            pathology results. Procedure Code(s):        --- Professional ---                           [phone], Colonoscopy, flexible; with biopsy, single                            or multiple Diagnosis Code(s):        --- Professional ---  K64.8, Other hemorrhoids                           K52.9, Noninfective gastroenteritis and colitis,                            unspecified CPT copyright 2022 American Medical Association. All rights reserved. The codes documented in this report are preliminary and upon coder review may  be revised to meet current compliance requirements. Toribio Fortune, MD Toribio Fortune,  01/12/2024 10:59:57 AM This report has been signed electronically. Number of Addenda: 0

## 2024-01-12 NOTE — Op Note (Addendum)
 [facility] Patient Name: Matthew Ortiz Procedure Date: 01/12/2024 9:49 AM MRN: [id number] Date of Birth: [birth year] Attending MD: Toribio Fortune , , [id number] CSN: [id number] Age: 63 Admit Type: Outpatient Procedure:                Upper GI endoscopy Indications:              Dysphagia, Abdominal bloating Providers:                Toribio Fortune, Madelin Hunter, RN, Bascom Blush Referring MD:              Medicines:                Monitored Anesthesia Care Complications:            No immediate complications. Estimated Blood Loss:     Estimated blood loss: none. Procedure:                Pre-Anesthesia Assessment:                           - Prior to the procedure, a History and Physical                            was performed, and patient medications, allergies                            and sensitivities were reviewed. The patient's                            tolerance of previous anesthesia was reviewed.                           - The risks and benefits of the procedure and the                            sedation options and risks were discussed with the                            patient. All questions were answered and informed                            consent was obtained.                           - ASA Grade Assessment: II - A patient with mild                            systemic disease.                           After obtaining informed consent, the endoscope was                            passed under direct vision. Throughout the                            procedure, the patient's blood  pressure, pulse, and                            oxygen saturations were monitored continuously. The                            [id number] ([id number])Leezm was introduced through the                            mouth, and advanced to the second part of duodenum.                            The upper GI endoscopy was accomplished without                            difficulty. The  patient tolerated the procedure                            well. Scope In: 10:11:37 AM Scope Out: 10:21:36 AM Total Procedure Duration: 0 hours 9 minutes 59 seconds  Findings:      No endoscopic abnormality was evident in the esophagus to explain the       patient's complaint of dysphagia. It was decided, however, to proceed       with dilation of the entire esophagus. A guidewire was placed and the       scope was withdrawn. Dilation was performed with a Savary dilator with       no resistance at 18 mm. Biopsies were obtained from the proximal and       distal esophagus with cold forceps for histology of eosinophilic       esophagitis.      Prior Nissen fundoplication was found at the gastroesophageal junction.       Adequate wrap.      The stomach was normal.      The examined duodenum was normal. Biopsies were taken with a cold       forceps for histology. Impression:               - No endoscopic esophageal abnormality to explain                            patient's dysphagia. Esophagus dilated. Biopsied.                           - Nissen fundoplication was found. Adequate wrap.                           - Normal stomach.                           - Normal examined duodenum. Biopsied. Moderate Sedation:      Per Anesthesia Care Recommendation:           - Discharge patient to home (ambulatory).                           - Resume previous diet.                           -  Await pathology results. Procedure Code(s):        --- Professional ---                           [id number], Esophagogastroduodenoscopy, flexible,                            transoral; with insertion of guide wire followed by                            passage of dilator(s) through esophagus over guide                            wire                           43239, 59, Esophagogastroduodenoscopy, flexible,                            transoral; with biopsy, single or multiple Diagnosis Code(s):        ---  Professional ---                           R13.10, Dysphagia, unspecified                           Z98.890, Other specified postprocedural states                           R14.0, Abdominal distension (gaseous) CPT copyright 2022 American Medical Association. All rights reserved. The codes documented in this report are preliminary and upon coder review may  be revised to meet current compliance requirements. Toribio Fortune, MD Toribio Fortune,  01/12/2024 10:53:47 AM This report has been signed electronically. Number of Addenda: 0

## 2024-01-12 NOTE — Discharge Instructions (Addendum)
 You are being discharged to home.  Resume your previous diet.  We are waiting for your pathology results.  Your physician has recommended a repeat colonoscopy for surveillance based on pathology results.

## 2024-01-12 NOTE — H&P (Addendum)
 Matthew Ortiz. is an 63 y.o. male.   Chief Complaint: Persistent diarrhea HPI: Matthew Ortiz. is a 63 y.o. male past medical history of recurrent diverticulitis, COPD, depression, asthma, diabetes, GERD status post hiatal hernia repair and Nissen fundoplication (1998), gout, hypertension, IBS, bipolar disorder, peripheral arterial disease, who presents for evaluation of diarrhea and dysphagia.   Patient reported for the last 5 months she has presented between 8-12 bowel movements per day.  No nighttime episodes of diarrhea.  Last few days she noted some rectal bleeding.  No abdominal pain although he has some discomfort in his right subcostal area, but no nausea, vomiting, fever, chills or melena.  Endorses some occasional dysphagia.  Past Medical History:  Diagnosis Date   Anxiety    Asthma    COPD (chronic obstructive pulmonary disease) (HCC)    Depression    Diabetes mellitus, type II (HCC)    GERD (gastroesophageal reflux disease)    Gout    HTN (hypertension)    IBS (irritable bowel syndrome)    Major depressive disorder in partial remission (HCC) 08/30/2017   Mixed bipolar I disorder in remission (HCC) 08/09/2022   Palpitations    Peripheral artery disease (HCC)     Past Surgical History:  Procedure Laterality Date   APPENDECTOMY  1977   FUNDOPLASTY TRANSTHORACIC  1998   HERNIA REPAIR  2014   RECONSTRUCTION OF NOSE  1978    Family History  Problem Relation Age of Onset   Depression Mother    Anxiety disorder Mother    Schizophrenia Father    Suicidality Father    Suicidality Paternal Grandfather    Drug abuse Paternal Grandfather    Alcohol abuse Paternal Grandfather    Social History:  reports that he quit smoking about 25 years ago. His smoking use included cigarettes. He started smoking about 45 years ago. He has a 30 pack-year smoking history. He has never used smokeless tobacco. He reports current alcohol use. He reports that he does not use  drugs.  Allergies:  Allergies  Allergen Reactions   Bee Pollen    Milk-Related Compounds Diarrhea   Penicillins Rash    Medications Prior to Admission  Medication Sig Dispense Refill   albuterol  (VENTOLIN  HFA) 108 (90 Base) MCG/ACT inhaler INHALE 1-2 PUFFS BY MOUTH EVERY 6 HOURS AS NEEDED FOR WHEEZE OR SHORTNESS OF BREATH 8.5 each 3   amLODipine (NORVASC) 5 MG tablet Take 5 mg by mouth daily.     dicyclomine (BENTYL) 20 MG tablet Take 20 mg by mouth 2 (two) times daily.     fenofibrate 54 MG tablet Take 54 mg by mouth daily.     fluticasone  (FLONASE ) 50 MCG/ACT nasal spray SPRAY 2 SPRAYS INTO EACH NOSTRIL EVERY DAY 48 mL 1   gabapentin  (NEURONTIN ) 100 MG capsule Take 2 capsules (200 mg total) by mouth at bedtime. 60 capsule 3   glucose blood test strip Use to test blood sugars 2 times daily. 420 each 3   hydrOXYzine  (ATARAX ) 10 MG tablet Take 1 tablet (10 mg total) by mouth at bedtime. 30 tablet 0   lithium  carbonate 300 MG capsule Take 2 capsules (600 mg total) by mouth daily with breakfast AND 3 capsules (900 mg total) daily after supper. 450 capsule 0   losartan  (COZAAR ) 25 MG tablet TAKE 1 TABLET BY MOUTH DAILY. PLEASE CALL MD OFFICE TO MAKE APPT FOR FUTURE REFILL [phone] 90 tablet 0   metFORMIN  (GLUCOPHAGE ) 500 MG tablet Take  1 tablet (500 mg total) by mouth 2 (two) times daily with a meal. 180 tablet 3   metoprolol  tartrate (LOPRESSOR ) 100 MG tablet Take 100 mg by mouth daily.     OXYGEN Place 2 L/min into the nose at bedtime.     pantoprazole (PROTONIX) 40 MG tablet Take 40 mg by mouth daily.     traZODone  (DESYREL ) 100 MG tablet Take 1 tablet (100 mg total) by mouth at bedtime. 90 tablet 1   TRUEplus Lancets 30G MISC Use to test blood sugars 2 times daily. 200 each 3   Vibegron  (GEMTESA ) 75 MG TABS Take 1 tablet (75 mg total) by mouth daily. 30 tablet 11   atorvastatin  (LIPITOR) 20 MG tablet Take 1 tablet (20 mg total) by mouth daily. 90 tablet 3    budeson-glycopyrrolate-formoterol (BREZTRI  AEROSPHERE) 160-9-4.8 MCG/ACT AERO inhaler Inhale 2 puffs into the lungs 2 (two) times daily.     indomethacin  (INDOCIN ) 25 MG capsule Take 1 capsule (25 mg total) by mouth 3 (three) times daily as needed. (Patient not taking: Reported on 12/07/2023) 30 capsule 0   tadalafil  (CIALIS ) 5 MG tablet Take 1 tablet (5 mg total) by mouth daily. Take one 5 mg tablet once daily at approximately the same time each day without regard to timing of sexual activity. (Patient not taking: Reported on 12/07/2023) 30 tablet 11    Results for orders placed or performed during the hospital encounter of 01/12/24 (from the past 48 hours)  Glucose, capillary     Status: Abnormal   Collection Time: 01/12/24  8:24 AM  Result Value Ref Range   Glucose-Capillary 106 (H) 70 - 99 mg/dL    Comment: Glucose reference range applies only to samples taken after fasting for at least 8 hours.   No results found.  Review of Systems  HENT:  Positive for trouble swallowing.   All other systems reviewed and are negative.   Blood pressure 105/81, pulse 76, temperature 98.7 F (37.1 C), temperature source Oral, resp. rate 16, SpO2 97%. Physical Exam  GENERAL: The patient is AO x3, in no acute distress. HEENT: Head is normocephalic and atraumatic. EOMI are intact. Mouth is well hydrated and without lesions. NECK: Supple. No masses LUNGS: Clear to auscultation. No presence of rhonchi/wheezing/rales. Adequate chest expansion HEART: RRR, normal s1 and s2. ABDOMEN: Soft, nontender, no guarding, no peritoneal signs, and nondistended. BS +. No masses. EXTREMITIES: Without any cyanosis, clubbing, rash, lesions or edema. NEUROLOGIC: AOx3, no focal motor deficit. SKIN: no jaundice, no rashes  Assessment/Plan Matthew Ortiz. is a 63 y.o. male past medical history of recurrent diverticulitis, COPD, depression, asthma, diabetes, GERD status post hiatal hernia repair and Nissen fundoplication  (1998), gout, hypertension, IBS, bipolar disorder, peripheral arterial disease, who presents for evaluation of diarrhea and dysphagia.   Will proceed with egd and colonoscopy.  Toribio Matthew Flavors, MD 01/12/2024, 8:32 AM

## 2024-01-12 NOTE — Anesthesia Procedure Notes (Addendum)
 Date/Time: 01/12/2024 10:02 AM  Performed by: Para Jerelene CROME, CRNAOxygen Delivery Method: Nasal cannula Comments: Opt Flow Nasal Cannula.SABRA

## 2024-01-12 NOTE — Anesthesia Preprocedure Evaluation (Addendum)
 Anesthesia Evaluation  Patient identified by MRN, date of birth, ID band Patient awake    Reviewed: Allergy & Precautions, H&P , NPO status , Patient's Chart, lab work & pertinent test results  Airway Mallampati: II  TM Distance: >3 FB Neck ROM: Full    Dental no notable dental hx.    Pulmonary asthma , COPD, former smoker   breath sounds clear to auscultation       Cardiovascular hypertension, + Peripheral Vascular Disease  Normal cardiovascular exam Rhythm:Regular Rate:Normal     Neuro/Psych  PSYCHIATRIC DISORDERS Anxiety Depression Bipolar Disorder   negative neurological ROS     GI/Hepatic Neg liver ROS,GERD  ,,  Endo/Other  diabetes    Renal/GU negative Renal ROS  negative genitourinary   Musculoskeletal  (+) Arthritis ,    Abdominal   Peds negative pediatric ROS (+)  Hematology negative hematology ROS (+)   Anesthesia Other Findings   Reproductive/Obstetrics negative OB ROS                              Anesthesia Physical Anesthesia Plan  ASA: 3  Anesthesia Plan: General   Post-op Pain Management:    Induction: Intravenous  PONV Risk Score and Plan:   Airway Management Planned: Nasal Cannula  Additional Equipment:   Intra-op Plan:   Post-operative Plan:   Informed Consent: I have reviewed the patients History and Physical, chart, labs and discussed the procedure including the risks, benefits and alternatives for the proposed anesthesia with the patient or authorized representative who has indicated his/her understanding and acceptance.     Dental advisory given  Plan Discussed with: CRNA  Anesthesia Plan Comments:          Anesthesia Quick Evaluation

## 2024-01-12 NOTE — Anesthesia Postprocedure Evaluation (Signed)
 Anesthesia Post Note  Patient: Matthew Ortiz.  Procedure(s) Performed: COLONOSCOPY EGD (ESOPHAGOGASTRODUODENOSCOPY)  Patient location during evaluation: PACU Anesthesia Type: General Level of consciousness: awake and alert Pain management: pain level controlled Vital Signs Assessment: post-procedure vital signs reviewed and stable Respiratory status: spontaneous breathing, nonlabored ventilation, respiratory function stable and patient connected to nasal cannula oxygen Cardiovascular status: blood pressure returned to baseline and stable Postop Assessment: no apparent nausea or vomiting Anesthetic complications: no   No notable events documented.   Last Vitals:  Vitals:   01/12/24 1056 01/12/24 1100  BP: 110/60 (!) 109/54  Pulse: 72   Resp: 20   Temp: 36.5 C   SpO2: 98%     Last Pain:  Vitals:   01/12/24 1100  TempSrc:   PainSc: 0-No pain                 Andrea Limes

## 2024-01-12 NOTE — Transfer of Care (Signed)
 Immediate Anesthesia Transfer of Care Note  Patient: Matthew Ortiz.  Procedure(s) Performed: COLONOSCOPY EGD (ESOPHAGOGASTRODUODENOSCOPY)  Patient Location: Short Stay  Anesthesia Type:General  Level of Consciousness: drowsy and patient cooperative  Airway & Oxygen Therapy: Patient Spontanous Breathing  Post-op Assessment: Report given to RN and Post -op Vital signs reviewed and stable  Post vital signs: Reviewed and stable  Last Vitals:  Vitals Value Taken Time  BP 110/60 01/12/24 10:56  Temp 36.5 C 01/12/24 10:56  Pulse 72 01/12/24 10:56  Resp 20 01/12/24 10:56  SpO2 98 % 01/12/24 10:56    Last Pain:  Vitals:   01/12/24 1056  TempSrc: Oral  PainSc: 0-No pain         Complications: No notable events documented.

## 2024-01-13 ENCOUNTER — Ambulatory Visit (HOSPITAL_COMMUNITY)
Admission: RE | Admit: 2024-01-13 | Discharge: 2024-01-13 | Disposition: A | Discharge status: Home / Self Care | Source: Ambulatory Visit | Attending: Gastroenterology | Admitting: Gastroenterology

## 2024-01-13 ENCOUNTER — Encounter (HOSPITAL_COMMUNITY): Payer: Self-pay | Admitting: Gastroenterology

## 2024-01-13 DIAGNOSIS — R197 Diarrhea, unspecified: Secondary | ICD-10-CM

## 2024-01-16 LAB — SURGICAL PATHOLOGY

## 2024-01-17 ENCOUNTER — Ambulatory Visit (HOSPITAL_COMMUNITY)
Admission: RE | Admit: 2024-01-17 | Discharge: 2024-01-17 | Disposition: A | Discharge status: Home / Self Care | Source: Ambulatory Visit | Attending: Gastroenterology | Admitting: Gastroenterology

## 2024-01-17 DIAGNOSIS — D3501 Benign neoplasm of right adrenal gland: Secondary | ICD-10-CM | POA: Insufficient documentation

## 2024-01-17 DIAGNOSIS — R197 Diarrhea, unspecified: Secondary | ICD-10-CM | POA: Diagnosis present

## 2024-01-17 DIAGNOSIS — K760 Fatty (change of) liver, not elsewhere classified: Secondary | ICD-10-CM | POA: Insufficient documentation

## 2024-01-17 LAB — POCT I-STAT CREATININE: Creatinine, Ser: 1 mg/dL (ref 0.61–1.24)

## 2024-01-17 MED ORDER — IOHEXOL 300 MG/ML  SOLN
100.0000 mL | Freq: Once | INTRAMUSCULAR | Status: AC | PRN
Start: 2024-01-17 — End: 2024-01-17
  Administered 2024-01-17: 100 mL via INTRAVENOUS

## 2024-01-18 ENCOUNTER — Ambulatory Visit (INDEPENDENT_AMBULATORY_CARE_PROVIDER_SITE_OTHER): Payer: Self-pay | Admitting: Gastroenterology

## 2024-01-19 ENCOUNTER — Encounter (INDEPENDENT_AMBULATORY_CARE_PROVIDER_SITE_OTHER): Payer: Self-pay | Admitting: *Deleted

## 2024-01-19 NOTE — Progress Notes (Signed)
 10 yr TCS noted in recall Patient result letter mailed procedure note and pathology result faxed to PCP

## 2024-01-20 ENCOUNTER — Encounter (HOSPITAL_COMMUNITY): Payer: Self-pay

## 2024-01-20 ENCOUNTER — Ambulatory Visit (INDEPENDENT_AMBULATORY_CARE_PROVIDER_SITE_OTHER): Payer: Self-pay | Admitting: Clinical

## 2024-01-20 ENCOUNTER — Ambulatory Visit (HOSPITAL_COMMUNITY): Payer: Self-pay | Admitting: Clinical

## 2024-01-20 DIAGNOSIS — Z91198 Patient's noncompliance with other medical treatment and regimen for other reason: Secondary | ICD-10-CM

## 2024-01-20 NOTE — Progress Notes (Signed)
 Clinical Social Work Note  Patient called office to cancel, was sick.  Other appointments are already in place.     Encounter Diagnosis  Name Primary?   Failure to attend appointment with reason given Yes     Elgie Crest, LCSW 01/20/2024, 10:14 AM

## 2024-01-31 ENCOUNTER — Ambulatory Visit (HOSPITAL_COMMUNITY): Admitting: Clinical

## 2024-01-31 ENCOUNTER — Encounter (HOSPITAL_COMMUNITY): Payer: Self-pay | Admitting: Clinical

## 2024-01-31 DIAGNOSIS — F424 Excoriation (skin-picking) disorder: Secondary | ICD-10-CM

## 2024-01-31 DIAGNOSIS — F4310 Post-traumatic stress disorder, unspecified: Secondary | ICD-10-CM

## 2024-01-31 DIAGNOSIS — F411 Generalized anxiety disorder: Secondary | ICD-10-CM | POA: Diagnosis not present

## 2024-01-31 DIAGNOSIS — F3181 Bipolar II disorder: Secondary | ICD-10-CM

## 2024-01-31 NOTE — Progress Notes (Unsigned)
 THERAPIST PROGRESS NOTE  Session Time: 8:02-9:00am  Session #8  Virtual Visit via Video Note  I connected with Matthew Ortiz. on 01/31/24 at  8:00 AM EDT by a video enabled telemedicine application and verified that I am speaking with the correct person using two identifiers.  Location: Patient: home Provider: home office   I discussed the limitations of evaluation and management by telemedicine and the availability of in person appointments. The patient expressed understanding and agreed to proceed.   I discussed the assessment and treatment plan with the patient. The patient was provided an opportunity to ask questions and all were answered. The patient agreed with the plan and demonstrated an understanding of the instructions.   The patient was advised to call back or seek an in-person evaluation if the symptoms worsen or if the condition fails to improve as anticipated.  I provided 58 minutes of non-face-to-face time during this encounter.  Elgie JINNY Crest, LCSW   Participation Level: Active  Behavioral Response: Casual Alert Euthymic  Type of Therapy: Individual Therapy  Treatment Goals addressed: *** LTG: [name] will reduce frequency, intensity, and duration of depression symptoms so that daily functioning is improved.  LTG: [name] will score less than 9 on the Patient Health Questionnaire (PHQ-9) as evidenced by intermittent administration of the questionnaire to determine progress.    STG: Identify and decrease cognitive distortions contributing negatively to mood and behavior by identifying 5-7 cognitive distortions [name] has and learning how to come up with replacement thoughts that are more balanced, realistic, and helpful.    STG: [name] will practice behavioral activation skills 2-3 times per week for the next 26 weeks. LTG: [name] will process life events to the extent needed so that he is able to move forward with various areas of life in a better frame of  mind per self-report.  STG: [name] will work on forgiveness, shame, sleep, relationship to food, or other issues as appropriate and as these present during sessions.  LTG: Score less than 5 on the GAD-7 as evidenced by intermittent administration of the questionnaire to determine progress in management of anxiety.  STG: [name] will practice problem solving skills 3 times per week for the next 4 weeks.  STG: [name] will reduce frequency of avoidant behaviors by 50% as evidenced by self-report in therapy sessions LTG: Learn about boundary types, how to implement them, and how to enforce them so that [name] feels more empowered and content with being able to maintain more helpful, appropriate boundaries in the future for a more balanced result.   STG: Learn breathing techniques and grounding techniques at an age-appropriate level and demonstrate mastery in session then report independent use of these skills out of session.  LTG: [name] will become more able to recall traumatic events without becoming overwhelmed with negative emotions.  STG: [name] will experience a 50% reduction in exaggerated beliefs about self and others that interfere with trauma resolution as evidenced by self-report.  STG: [name] will learn coping skills and increase resilience through application of CBT techniques and through processing of life in a shame framework.  STG: [name] will verbalize an increased sense of mastery over PTSD symptoms by using several techniques to cope with flashbacks, decrease the power of triggers, and decrease negative thinking.  LTG: [name] mood will be stabilized and he will identify manic behaviors more quickly so that he is able to quickly manage any manic symptoms before they cause harm as measured by self-report and wife's reports  to him.  LTG: Learn and practice communication techniques such as I statements, open-ended questions, reflective listening, assertiveness, fair  fighting rules, initiating conversations, and more as necessary and taught in session.    STG: Learn emotion regulation strategies, distress tolerance methods, and interpersonal effective skills and practice using them.  LTG: Explore and resolve issues relating to history of abuse/neglect/trauma victimization that have contributed to presentation of anxiety, hypervigilance, rage, and other symptoms   LTG:  Explore personal core beliefs, rules and assumptions, and cognitive distortions through therapist using Cognitive Behavioral Therapy; learn how to develop replacement thoughts and challenge unhelpful thoughts.    LTG: Work to Arts development officer from models like CBT, Stages of Change, DBT, shame resilience theory, ACT, SFBT, MI, trauma-informed therapy and others to be able to manage mental health symptoms, AEB practicing out of session and reporting back.   STG: [name] will learn about what forgiveness is and is not, then go through the four phases of forgiveness (uncovering, decision, work, deepening).  ProgressTowards Goals: Progressing  Interventions: CBT, Supportive, and Other: shame   Summary: Matthew Aber. is a 63 y.o. male who presents with  Bipolar II, GAD, PTSD (and a history of Excoriation) who presents for therapy. He presented oriented x5 and stated he was feeling okay.  CSW evaluated patient's medication compliance, use of coping tools, and self-care, as applicable.  He provided an update on various aspects of his life that are normally discussed in therapy, including the effect of the hot weather on him this summer and his welcoming of the change in season, a recent volunteer activity, a neighborhood reunion that placed patient and his wife into visits with a former neighbor who is going downhill quickly due to an ALS diagnosis, and problems with an elderly friend and her daughter that he has previously brought up in sessions.  Finding out how quickly his former neighbor is  deteriorating with her ALS has had an emotional impact on him and he expressed some despondency about how little people care about him or what happens to him.  He expressed that when he tells friends (which it later seemed referred to specifically this one friend and her daughter) about his problems, he said It falls on deaf ears and I'm just dismissed.  He does not want to kick out of his life these individuals because they now sometimes speak in a way that, to him, is unacceptable, but at the same time he is not okay with proceeding along the same path as always.  CSW talked about and illustrated how boundaries can be constantly fluctuating closer and further away as needed in response to life's situations.  CSW posited several times with patient that what he thinks he knows about what other people think or what they mean with various statements actually is the cognitive distortion of mind reading and/or fortune telling.  We have reviewed this before and he did agree it was possible.  CSW also explained that these cognitive distortions could possibly come from the core beliefs of not enough that were instilled in him in childhood.  He disclosed that he thinking could be a problem based on those core beliefs, but even after agreeing with this, he immediately started telling another story that followed the same theme.    Suicidal/Homicidal: No without intent/plan  Therapist Response: Patient is progressing AEB engaging in scheduled therapy session.  Throughout the session, CSW gave patient the opportunity to explore thoughts and feelings associated with current life  situations and past/present stressors.   CSW challenged patient gently and appropriately to consider different ways of looking at reported issues. CSW encouraged patient's expression of feelings and validated these using empathy, active listening, open body language, and unconditional positive regard.   ***   Plan/Recommendations:   Return to therapy in 2 weeks to next scheduled appointment on 10/14, reflect on what was discussed in session, engage in self care behaviors as explored in session, do homework as assigned (consider his internal messages of not enough as he interacts with others, try setting boundaries that are flexible rather than rigid), and return to next session prepared to talk about experience with new coping methods.   Diagnosis:  Bipolar II disorder (HCC)  PTSD (post-traumatic stress disorder)  Excoriation (skin-picking) disorder  GAD (generalized anxiety disorder)  Collaboration of Care: Psychiatrist AEB -psychiatrist can read therapy notes; therapist can and does read psychiatric notes prior to sessions   Patient/Guardian was advised Release of Information must be obtained prior to any record release in order to collaborate their care with an outside provider. Patient/Guardian was advised if they have not already done so to contact the registration department to sign all necessary forms in order for us  to release information regarding their care.   Consent: Patient/Guardian gives verbal consent for treatment and assignment of benefits for services provided during this visit. Patient/Guardian expressed understanding and agreed to proceed.   Elgie JINNY Crest, LCSW 01/31/2024

## 2024-02-01 ENCOUNTER — Encounter (INDEPENDENT_AMBULATORY_CARE_PROVIDER_SITE_OTHER): Payer: Self-pay | Admitting: Gastroenterology

## 2024-02-08 ENCOUNTER — Encounter (HOSPITAL_COMMUNITY): Payer: Self-pay | Admitting: Student in an Organized Health Care Education/Training Program

## 2024-02-08 ENCOUNTER — Ambulatory Visit (HOSPITAL_COMMUNITY): Admitting: Student in an Organized Health Care Education/Training Program

## 2024-02-08 DIAGNOSIS — F5101 Primary insomnia: Secondary | ICD-10-CM

## 2024-02-08 DIAGNOSIS — F3181 Bipolar II disorder: Secondary | ICD-10-CM | POA: Diagnosis not present

## 2024-02-08 MED ORDER — LITHIUM CARBONATE 300 MG PO CAPS: ORAL_CAPSULE | ORAL | 0 refills | Status: DC

## 2024-02-08 MED ORDER — TRAZODONE HCL 100 MG PO TABS
100.0000 mg | ORAL_TABLET | Freq: Every day | ORAL | 0 refills | Status: AC
Start: 2024-02-08 — End: ?

## 2024-02-08 MED ORDER — HYDROXYZINE HCL 10 MG PO TABS
10.0000 mg | ORAL_TABLET | Freq: Every day | ORAL | 0 refills | Status: AC
Start: 2024-02-08 — End: ?

## 2024-02-08 NOTE — Patient Instructions (Addendum)
 Dear Matthew Ortiz,  Most effective treatment for your mental health disease involves BOTH a psychiatrist AND a therapist Psychiatrist to manage medications Therapist to help identify personal goals, barriers from those goals, and plan to achieve those goals by understanding emotions Please make regular appointments with an outpatient psychiatrist and other doctors once you leave the hospital (if any, otherwise, please see below for resources to make an appointment).  For therapy outside the hospital, please ask for these specific types of therapy: DBT ________________________________________________________  SAFETY CRISIS  Dial 988 for National Suicide & Crisis Lifeline    Text [phone] for Crisis Text Line:     [facility]:  [address] FIRST FLOOR.  [place], [place] [address].  [phone]  Mobile Crisis Response Teams Listed by counties in vicinity of [facility] providers [facility] [phone] [facility] [phone] [facility] [phone] [facility] [phone] [place]                * [facility] [phone]                * [facility] [phone] [facility] [phone] [facility] [employer]  [phone] * [employer] [phone] ________________________________________________________
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
## 2024-02-09 ENCOUNTER — Other Ambulatory Visit: Payer: Self-pay | Admitting: Medical Genetics

## 2024-02-13 ENCOUNTER — Ambulatory Visit: Admitting: Adult Health

## 2024-02-14 ENCOUNTER — Ambulatory Visit (HOSPITAL_COMMUNITY): Admitting: Clinical

## 2024-02-15 ENCOUNTER — Other Ambulatory Visit: Payer: Self-pay | Admitting: Adult Health

## 2024-02-15 ENCOUNTER — Encounter (INDEPENDENT_AMBULATORY_CARE_PROVIDER_SITE_OTHER): Payer: Self-pay | Admitting: Gastroenterology

## 2024-02-15 DIAGNOSIS — J4530 Mild persistent asthma, uncomplicated: Secondary | ICD-10-CM

## 2024-02-16 ENCOUNTER — Encounter: Payer: Self-pay | Admitting: Adult Health

## 2024-02-16 ENCOUNTER — Other Ambulatory Visit (HOSPITAL_COMMUNITY)

## 2024-02-16 ENCOUNTER — Ambulatory Visit: Admitting: Adult Health

## 2024-02-16 VITALS — BP 110/74 | HR 71 | Temp 98.4°F | Ht 72.0 in | Wt 175.4 lb

## 2024-02-16 DIAGNOSIS — K219 Gastro-esophageal reflux disease without esophagitis: Secondary | ICD-10-CM

## 2024-02-16 DIAGNOSIS — L821 Other seborrheic keratosis: Secondary | ICD-10-CM

## 2024-02-16 DIAGNOSIS — E118 Type 2 diabetes mellitus with unspecified complications: Secondary | ICD-10-CM

## 2024-02-16 DIAGNOSIS — J449 Chronic obstructive pulmonary disease, unspecified: Secondary | ICD-10-CM | POA: Diagnosis not present

## 2024-02-16 DIAGNOSIS — E785 Hyperlipidemia, unspecified: Secondary | ICD-10-CM | POA: Diagnosis not present

## 2024-02-16 DIAGNOSIS — F3181 Bipolar II disorder: Secondary | ICD-10-CM | POA: Diagnosis not present

## 2024-02-16 DIAGNOSIS — N3281 Overactive bladder: Secondary | ICD-10-CM

## 2024-02-16 DIAGNOSIS — I10 Essential (primary) hypertension: Secondary | ICD-10-CM

## 2024-02-16 DIAGNOSIS — K580 Irritable bowel syndrome with diarrhea: Secondary | ICD-10-CM

## 2024-02-16 MED ORDER — METFORMIN HCL 500 MG PO TABS: 500.0000 mg | ORAL_TABLET | Freq: Every day | ORAL | Status: DC

## 2024-02-16 NOTE — Progress Notes (Signed)
 [facility] clinic  Provider:  Jereld Serum DNP  Code Status:  Full Code  Goals of Care:     01/12/2024    8:14 AM  Advanced Directives  Does Patient Have a Medical Advance Directive? No  Would patient like information on creating a medical advance directive? No - Patient declined     Chief Complaint  Patient presents with   Follow-up    3 month follow up. Patient would like to see a dermatologists    Discussed the use of AI scribe software for clinical note transcription with the patient, who gave verbal consent to proceed.  HPI: Patient is a 63 y.o. male seen today for a 54-month follow up of chronic medical issues.  He experienced a syncopal episode at home after being out of bed for 24 hours unassisted, resulting in a minor head bump without significant injury. He was told to stay in bed for 24 hour after colonoscopy but got up and fell.  For PTSD with panic attacks, insomnia, and bipolar II disorder, he is on hydroxyl 10 mg at bedtime as needed, reduced from 25 mg due to excessive sedation. He continues trazodone  100 mg at bedtime and lithium  600 mg in the morning and 900 mg in the evening. He sees a therapist and a medical professional regularly.  His hypertension is managed with Norvasc 5 mg daily, metoprolol  tartrate 100 mg daily, and losartan  25 mg daily.  His diabetes is well-controlled with a morning blood sugar reading of 120 mg/dL and a recent J8r of 4.1%. He takes metformin  500 mg twice daily but experiences diarrhea, which may be exacerbated by the medication.  For hyperlipidemia, he takes atorvastatin  20 mg daily and fenofibrate 54 mg daily, which was adjusted from 145 mg. His triglycerides have improved from 389 mg/dL to 752 mg/dL.  He has IBS and takes Bentyl 20 mg twice daily, though he is unsure if he is currently taking it. He experiences episodes of diarrhea, which he attributes to both IBS and metformin  use.  He has COPD and uses budesonide/formoterol  (Breztri ) two puffs twice daily and albuterol  as needed. He notes wheezing when reclining and attributes some symptoms to acid reflux, which he manages with Protonix 40 mg daily.  He has seborrheic keratosis on his neck, face, and back, which has been present for years and is non-itchy. He has not seen a dermatologist since COVID-19 due to his previous dermatologist's retirement.  He has reduced fast food and greasy food intake and engages in physical activity, achieving 7,000 to 10,000 steps on good days. He is planning to switch to full Medicare with a rider policy as he turns 64 in January.     Past Medical History:  Diagnosis Date   Anxiety    Asthma    COPD (chronic obstructive pulmonary disease) (HCC)    Depression    Diabetes mellitus, type II (HCC)    GERD (gastroesophageal reflux disease)    Gout    HTN (hypertension)    IBS (irritable bowel syndrome)    Major depressive disorder in partial remission 08/30/2017   Mixed bipolar I disorder in remission 08/09/2022   Palpitations    Peripheral artery disease     Past Surgical History:  Procedure Laterality Date   APPENDECTOMY  1977   COLONOSCOPY N/A 01/12/2024   Procedure: COLONOSCOPY;  Surgeon: Eartha Angelia Sieving, MD;  [facility];  Service: Gastroenterology;  Laterality: N/A;  10:15am, ASA 3   ESOPHAGOGASTRODUODENOSCOPY N/A 01/12/2024  Procedure: EGD (ESOPHAGOGASTRODUODENOSCOPY);  Surgeon: Eartha Flavors, Toribio, MD;  Location: [facility];  Service: Gastroenterology;  Laterality: N/A;   FUNDOPLASTY TRANSTHORACIC  1998   HERNIA REPAIR  2014   RECONSTRUCTION OF NOSE  1978    Allergies  Allergen Reactions   Bee Pollen    Milk-Related Compounds Diarrhea   Penicillins Rash    Outpatient Encounter Medications as of 02/16/2024  Medication Sig   albuterol  (VENTOLIN  HFA) 108 (90 Base) MCG/ACT inhaler INHALE 1-2 PUFFS BY MOUTH EVERY 6 HOURS AS NEEDED FOR WHEEZE OR SHORTNESS OF BREATH   amLODipine  (NORVASC) 5 MG tablet Take 5 mg by mouth daily.   fenofibrate 54 MG tablet Take 54 mg by mouth daily.   fluticasone  (FLONASE ) 50 MCG/ACT nasal spray SPRAY 2 SPRAYS INTO EACH NOSTRIL EVERY DAY   glucose blood test strip Use to test blood sugars 2 times daily.   hydrOXYzine  (ATARAX ) 10 MG tablet Take 1 tablet (10 mg total) by mouth at bedtime.   indomethacin  (INDOCIN ) 25 MG capsule Take 1 capsule (25 mg total) by mouth 3 (three) times daily as needed.   lithium  carbonate 300 MG capsule Take 2 capsules (600 mg total) by mouth daily with breakfast AND 3 capsules (900 mg total) daily after supper.   losartan  (COZAAR ) 25 MG tablet TAKE 1 TABLET BY MOUTH DAILY. PLEASE CALL MD OFFICE TO MAKE APPT FOR FUTURE REFILL [phone]   metoprolol  tartrate (LOPRESSOR ) 100 MG tablet Take 100 mg by mouth daily.   OXYGEN Place 2 L/min into the nose at bedtime.   traZODone  (DESYREL ) 100 MG tablet Take 1 tablet (100 mg total) by mouth at bedtime.   TRUEplus Lancets 30G MISC Use to test blood sugars 2 times daily.   [DISCONTINUED] budeson-glycopyrrolate-formoterol (BREZTRI  AEROSPHERE) 160-9-4.8 MCG/ACT AERO inhaler Inhale 2 puffs into the lungs 2 (two) times daily.   [DISCONTINUED] metFORMIN  (GLUCOPHAGE ) 500 MG tablet Take 1 tablet (500 mg total) by mouth 2 (two) times daily with a meal.   dicyclomine (BENTYL) 20 MG tablet Take 20 mg by mouth 2 (two) times daily.   metFORMIN  (GLUCOPHAGE ) 500 MG tablet Take 1 tablet (500 mg total) by mouth at bedtime.   pantoprazole (PROTONIX) 40 MG tablet Take 40 mg by mouth daily.   Vibegron  (GEMTESA ) 75 MG TABS Take 1 tablet (75 mg total) by mouth daily.   [DISCONTINUED] albuterol  (VENTOLIN  HFA) 108 (90 Base) MCG/ACT inhaler INHALE 1-2 PUFFS BY MOUTH EVERY 6 HOURS AS NEEDED FOR WHEEZE OR SHORTNESS OF BREATH   No facility-administered encounter medications on file as of 02/16/2024.    Review of Systems:  Review of Systems  Constitutional:  Negative for activity change, appetite  change and fever.  HENT:  Negative for sore throat.   Eyes: Negative.   Cardiovascular:  Negative for chest pain and leg swelling.  Gastrointestinal:  Negative for abdominal distention, diarrhea and vomiting.  Genitourinary:  Negative for dysuria, frequency and urgency.  Skin:  Negative for color change.  Neurological:  Negative for dizziness and headaches.  Psychiatric/Behavioral:  Negative for behavioral problems and sleep disturbance. The patient is not nervous/anxious.     Health Maintenance  Topic Date Due   Pneumococcal Vaccine: 50+ Years (2 of 2 - PCV) 07/13/2018   Influenza Vaccine  12/02/2023   FOOT EXAM  12/02/2023   Diabetic kidney evaluation - Urine ACR  01/13/2024   COVID-19 Vaccine (9 - 2025-26 season) 01/31/2024   Medicare Annual Wellness (AWV)  03/20/2024  OPHTHALMOLOGY EXAM  06/01/2024 (Originally 07/02/2023)   HEMOGLOBIN A1C  05/16/2024   Diabetic kidney evaluation - eGFR measurement  11/13/2024   DTaP/Tdap/Td (5 - Td or Tdap) 07/21/2033   Colonoscopy  01/11/2034   Hepatitis B Vaccines 19-59 Average Risk  Completed   Hepatitis C Screening  Completed   HIV Screening  Completed   Zoster Vaccines- Shingrix  Completed   HPV VACCINES  Aged Out   Meningococcal B Vaccine  Aged Out    Physical Exam: Vitals:   02/16/24 0927  BP: 110/74  Pulse: 71  Temp: 98.4 F (36.9 C)  SpO2: 97%  Weight: 175 lb 6.4 oz (79.6 kg)  Height: 6' (1.829 m)   Body mass index is 23.79 kg/m. Physical Exam Constitutional:      Appearance: Normal appearance.  HENT:     Head: Normocephalic and atraumatic.     Mouth/Throat:     Mouth: Mucous membranes are moist.  Eyes:     Conjunctiva/sclera: Conjunctivae normal.  Cardiovascular:     Rate and Rhythm: Normal rate and regular rhythm.     Pulses: Normal pulses.     Heart sounds: Normal heart sounds.  Pulmonary:     Effort: Pulmonary effort is normal.     Breath sounds: Normal breath sounds.  Abdominal:     General: Bowel sounds  are normal.     Palpations: Abdomen is soft.  Musculoskeletal:        General: No swelling. Normal range of motion.     Cervical back: Normal range of motion.  Skin:    General: Skin is warm and dry.     Comments: Raised rough oval to round light to brown raised areas on left neck  Neurological:     General: No focal deficit present.     Mental Status: He is alert and oriented to person, place, and time.  Psychiatric:        Mood and Affect: Mood normal.        Behavior: Behavior normal.        Thought Content: Thought content normal.        Judgment: Judgment normal.     Labs reviewed: Basic Metabolic Panel: Recent Labs    08/01/23 0929 11/14/23 1045 01/17/24 1105  NA 143 139  --   K 4.8 4.5  --   CL 109* 106  --   CO2 20 25  --   GLUCOSE 115* 101*  --   BUN 14 15  --   CREATININE 0.96 0.81 1.00  CALCIUM  9.3 9.7  --   TSH 2.270  --   --    Liver Function Tests: Recent Labs    08/01/23 0929  AST 22  ALT 36  ALKPHOS 98  BILITOT 1.3*  PROT 6.5  ALBUMIN 4.4   No results for input(s): LIPASE, AMYLASE in the last 8760 hours. No results for input(s): AMMONIA in the last 8760 hours. CBC: Recent Labs    08/01/23 0929 11/14/23 1045  WBC CANCELED 7.3  NEUTROABS  --  4,730  HGB CANCELED 13.9  HCT CANCELED 43.0  MCV  --  90.7  PLT CANCELED 273   Lipid Panel: Recent Labs    04/22/23 0952 11/14/23 1045  CHOL 106 84  HDL 43 37*  LDLCALC 44 16  TRIG 105 247*  CHOLHDL 2.5 2.3   Lab Results  Component Value Date   HGBA1C 5.8 (H) 11/14/2023    Procedures since last visit: No results found.  Assessment/Plan  1. Chronic obstructive pulmonary disease, unspecified COPD type (HCC) (Primary) -  Managed with budesonide/formoterol and albuterol . Symptoms exacerbated by reclining and possible acid reflux.  2. Essential hypertension -  Controlled with Norvasc, metoprolol  tartrate, and losartan . Blood pressure 110/74 mmHg.  3. Bipolar 2 disorder,  major depressive episode (HCC) -  Moderate, PHQ-9 score 11. Engaged in therapy and reports support. -  Managed with lithium  and trazodone . Lithium  level 0.6 mmol/L.  4. Hyperlipidemia LDL goal <100 -  Managed with atorvastatin  and fenofibrate. Triglycerides improved to 160 mg/dL.  5. Irritable bowel syndrome with diarrhea -  Ongoing symptoms, possible exacerbation by metformin . - Reduce metformin  to 500 mg once daily.  6. Type 2 diabetes mellitus with complication, without long-term current use of insulin (HCC) -  Well-controlled with blood glucose 120 mg/dL, J8r 4.1%. - Reduce metformin  to 500 mg once daily at bedtime. - metFORMIN  (GLUCOPHAGE ) 500 MG tablet; Take 1 tablet (500 mg total) by mouth at bedtime.  7. Gastroesophageal reflux disease without esophagitis -  Managed with Protonix. Symptoms well-controlled.  8. Seborrheic keratosis -  Present on left neck, face, and back. - Refer to dermatologist for evaluation and management. - Ambulatory referral to Dermatology    Labs/tests ordered:  None   Return in about 3 months (around 05/18/2024).  Matthew Londo Medina-Vargas, NP

## 2024-02-17 ENCOUNTER — Other Ambulatory Visit (HOSPITAL_COMMUNITY)

## 2024-02-20 ENCOUNTER — Telehealth: Payer: Self-pay | Admitting: Adult Health

## 2024-02-20 NOTE — Telephone Encounter (Signed)
 [facility] is not accepting new patients.   The patient needs a new referral.   Please advise.

## 2024-02-21 ENCOUNTER — Encounter (HOSPITAL_COMMUNITY): Payer: Self-pay | Admitting: Clinical

## 2024-02-21 ENCOUNTER — Telehealth (HOSPITAL_COMMUNITY): Payer: Self-pay | Admitting: Clinical

## 2024-02-21 NOTE — Telephone Encounter (Signed)
 I called and spoke to [place]. I let him know that his Dermatology referral has been sent to [facility] and [facility]  Address: [address]  [place] [address]  Phone [phone]  [name] said he will call them on Monday if their office had not contacted him by then.

## 2024-02-21 NOTE — Telephone Encounter (Signed)
 Entered by mistake.

## 2024-02-22 ENCOUNTER — Other Ambulatory Visit (HOSPITAL_COMMUNITY)

## 2024-02-23 ENCOUNTER — Ambulatory Visit (INDEPENDENT_AMBULATORY_CARE_PROVIDER_SITE_OTHER): Admitting: Gastroenterology

## 2024-02-23 ENCOUNTER — Other Ambulatory Visit (HOSPITAL_COMMUNITY)
Admission: RE | Admit: 2024-02-23 | Discharge: 2024-02-23 | Disposition: A | Discharge status: Home / Self Care | Payer: Self-pay | Source: Ambulatory Visit | Attending: Medical Genetics | Admitting: Medical Genetics

## 2024-02-23 ENCOUNTER — Encounter (INDEPENDENT_AMBULATORY_CARE_PROVIDER_SITE_OTHER): Payer: Self-pay | Admitting: Gastroenterology

## 2024-02-23 VITALS — BP 112/71 | HR 76 | Temp 97.5°F | Ht 72.0 in | Wt 177.4 lb

## 2024-02-23 DIAGNOSIS — K529 Noninfective gastroenteritis and colitis, unspecified: Secondary | ICD-10-CM

## 2024-02-23 DIAGNOSIS — R140 Abdominal distension (gaseous): Secondary | ICD-10-CM | POA: Diagnosis not present

## 2024-02-23 NOTE — Patient Instructions (Addendum)
 Perform blood workup Continue Imodium as needed for diarrhea Stop indomethacin  for 1 month.  Please update us  if symptoms improve/resolve after this.  If not, we will proceed with a course of Xifaxan.

## 2024-02-23 NOTE — Progress Notes (Unsigned)
 Matthew Ortiz, M.D. Gastroenterology & Hepatology [facility] [address] [place], [place] [address]  Primary Care Physician: Phyllis Jereld BROCKS, NP [phone] [address] [place] [address]  I will communicate my assessment and recommendations to the referring MD via EMR.  Problems: Chronic diarrhea Dysphagia Bloating  History of Present Illness: Matthew Ortiz. is a 63 y.o. male with medical history of recurrent diverticulitis, COPD, depression, asthma, diabetes, GERD status post hiatal hernia repair and Nissen fundoplication (1998), gout, hypertension, IBS, bipolar disorder, peripheral arterial disease, who presents for follow up of chronic diarrhea.  The patient was last seen on 12/05/2023. Patient had C. difficile, celiac disease panel, GI pathogen panel, ova and parasites checked which were all within normal limits.  CT of the abdomen pelvis with IV contrast on 01/17/2024 showed mild hepatic steatosis and stable right adrenal adenoma but no other abnormalities.  Patient was scheduled for colonoscopy with findings described below.  Patient states he is moving his bowels regularly, as he has to have a BM briefly after eating . He has had some fecal accidents as he has not been able to make it to the bathroom. Stools are more formed. States that he has frequent urgency, which is bothering him significantly. He has been somewhat bloated. Has noticed that when eats milk based products, his symptoms worsen.  The patient denies having any nausea, vomiting, fever, chills, hematochezia, melena, hematemesis, abdominal distention, abdominal pain, jaundice, pruritus or weight loss.  Last EGD: 01/12/2024 - No endoscopic esophageal abnormality to explain patient' s dysphagia. Esophagus dilated. Biopsied. - Nissen fundoplication was found. Adequate wrap. - Normal stomach. - Normal examined duodenum. Biopsied.  Pathology showed mild esophagitis,  normal small bowel.  Last Colonoscopy: 01/12/2024 - The entire examined colon is normal. Biopsied. - Non- bleeding internal hemorrhoids.    Even though there was no inflammation endoscopically, the random biopsies of the colon showed minimal active colitis which was considered to be secondary to medication (sometimes caused by medication like indomethacin , metformin  or lithium ) or due to self resolving infection.   Past Medical History: Past Medical History:  Diagnosis Date   Anxiety    Asthma    COPD (chronic obstructive pulmonary disease) (HCC)    Depression    Diabetes mellitus, type II (HCC)    GERD (gastroesophageal reflux disease)    Gout    HTN (hypertension)    IBS (irritable bowel syndrome)    Major depressive disorder in partial remission 08/30/2017   Mixed bipolar I disorder in remission 08/09/2022   Palpitations    Peripheral artery disease     Past Surgical History: Past Surgical History:  Procedure Laterality Date   APPENDECTOMY  1977   COLONOSCOPY N/A 01/12/2024   Procedure: COLONOSCOPY;  Surgeon: Ortiz Angelia Toribio, MD;  [facility]: [facility];  Service: Gastroenterology;  Laterality: N/A;  10:15am, ASA 3   ESOPHAGOGASTRODUODENOSCOPY N/A 01/12/2024   Procedure: EGD (ESOPHAGOGASTRODUODENOSCOPY);  Surgeon: Ortiz Angelia, Toribio, MD;  Location: [facility];  Service: Gastroenterology;  Laterality: N/A;   FUNDOPLASTY TRANSTHORACIC  1998   HERNIA REPAIR  2014   RECONSTRUCTION OF NOSE  1978    Family History: Family History  Problem Relation Age of Onset   Depression Mother    Anxiety disorder Mother    Schizophrenia Father    Suicidality Father    Suicidality Paternal Grandfather    Drug abuse Paternal Grandfather    Alcohol abuse Paternal Grandfather     Social  History: Social History   Tobacco Use  Smoking Status Former   Current packs/day: 0.00   Average packs/day: 1.5 packs/day for 20.0 years (30.0 ttl pk-yrs)   Types: Cigarettes    Start date: 05/03/1978   Quit date: 05/03/1998   Years since quitting: 25.8  Smokeless Tobacco Never   Social History   Substance and Sexual Activity  Alcohol Use Yes   Comment: socially-small amount of wine monthly - 1 glass    Social History   Substance and Sexual Activity  Drug Use No   Types: Marijuana   Comment: last use in 2018    Allergies: Allergies  Allergen Reactions   Bee Pollen    Milk-Related Compounds Diarrhea   Penicillins Rash    Medications: Current Outpatient Medications  Medication Sig Dispense Refill   albuterol  (VENTOLIN  HFA) 108 (90 Base) MCG/ACT inhaler INHALE 1-2 PUFFS BY MOUTH EVERY 6 HOURS AS NEEDED FOR WHEEZE OR SHORTNESS OF BREATH 8.5 each 3   amLODipine (NORVASC) 5 MG tablet Take 5 mg by mouth daily.     dicyclomine (BENTYL) 20 MG tablet Take 20 mg by mouth 2 (two) times daily.     fenofibrate 54 MG tablet Take 54 mg by mouth daily.     fluticasone  (FLONASE ) 50 MCG/ACT nasal spray SPRAY 2 SPRAYS INTO EACH NOSTRIL EVERY DAY 48 mL 1   glucose blood test strip Use to test blood sugars 2 times daily. 420 each 3   hydrOXYzine  (ATARAX ) 10 MG tablet Take 1 tablet (10 mg total) by mouth at bedtime. 90 tablet 0   indomethacin  (INDOCIN ) 25 MG capsule Take 1 capsule (25 mg total) by mouth 3 (three) times daily as needed. 30 capsule 0   lithium  carbonate 300 MG capsule Take 2 capsules (600 mg total) by mouth daily with breakfast AND 3 capsules (900 mg total) daily after supper. 450 capsule 0   losartan  (COZAAR ) 25 MG tablet TAKE 1 TABLET BY MOUTH DAILY. PLEASE CALL MD OFFICE TO MAKE APPT FOR FUTURE REFILL [phone] 90 tablet 0   metFORMIN  (GLUCOPHAGE ) 500 MG tablet Take 1 tablet (500 mg total) by mouth at bedtime.     metoprolol  tartrate (LOPRESSOR ) 100 MG tablet Take 100 mg by mouth daily.     OXYGEN Place 2 L/min into the nose at bedtime.     pantoprazole (PROTONIX) 40 MG tablet Take 40 mg by mouth daily.     traZODone  (DESYREL ) 100 MG tablet Take 1  tablet (100 mg total) by mouth at bedtime. 90 tablet 0   TRELEGY ELLIPTA 100-62.5-25 MCG/ACT AEPB Inhale 1 puff into the lungs daily.     TRUEplus Lancets 30G MISC Use to test blood sugars 2 times daily. 200 each 3   Vibegron  (GEMTESA ) 75 MG TABS Take 1 tablet (75 mg total) by mouth daily. 30 tablet 11   No current facility-administered medications for this visit.    Review of Systems: GENERAL: negative for malaise, night sweats HEENT: No changes in hearing or vision, no nose bleeds or other nasal problems. NECK: Negative for lumps, goiter, pain and significant neck swelling RESPIRATORY: Negative for cough, wheezing CARDIOVASCULAR: Negative for chest pain, leg swelling, palpitations, orthopnea GI: SEE HPI MUSCULOSKELETAL: Negative for joint pain or swelling, back pain, and muscle pain. SKIN: Negative for lesions, rash PSYCH: Negative for sleep disturbance, mood disorder and recent psychosocial stressors. HEMATOLOGY Negative for prolonged bleeding, bruising easily, and swollen nodes. ENDOCRINE: Negative for cold or heat intolerance, polyuria, polydipsia and goiter.  NEURO: negative for tremor, gait imbalance, syncope and seizures. The remainder of the review of systems is noncontributory.   Physical Exam: BP 112/71 (BP Location: Left Arm, Patient Position: Sitting, Cuff Size: Normal)   Pulse 76   Temp (!) 97.5 F (36.4 C) (Temporal)   Ht 6' (1.829 m)   Wt 177 lb 6.4 oz (80.5 kg)   BMI 24.06 kg/m  GENERAL: The patient is AO x3, in no acute distress. HEENT: Head is normocephalic and atraumatic. EOMI are intact. Mouth is well hydrated and without lesions. NECK: Supple. No masses LUNGS: Clear to auscultation. No presence of rhonchi/wheezing/rales. Adequate chest expansion HEART: RRR, normal s1 and s2. ABDOMEN: Soft, nontender, no guarding, no peritoneal signs, and nondistended. BS +. No masses. EXTREMITIES: Without any cyanosis, clubbing, rash, lesions or edema. NEUROLOGIC: AOx3, no  focal motor deficit. SKIN: no jaundice, no rashes  Imaging/Labs: as above  I personally reviewed and interpreted the available labs, imaging and endoscopic files.  Impression and Plan: Matthew Ortiz. is a 63 y.o. male with medical history of recurrent diverticulitis, COPD, depression, asthma, diabetes, GERD status post hiatal hernia repair and Nissen fundoplication (1998), gout, hypertension, IBS, bipolar disorder, peripheral arterial disease, who presents for follow up of chronic diarrhea.  Patient has presented procuring issues with diarrhea, although the severity of these symptoms have slightly improved compared to prior.  Has had significant blood testing, stool testing and endoscopy investigations that have only showed some mild histologic colitis without clear-cut reasons for his persistent symptoms.  We will order serology for alpha gal.  I suspect that his symptoms are related to drug-induced colitis, I advised him to stop taking the medicine for 1 month as this may be the culprit of his symptoms.  He can take Imodium as needed for persistent diarrhea for now.  Nevertheless, if his symptoms persist, we will give him a course of Xifaxan for possible IBS-D.  -Check alpha gal panel -Continue Imodium as needed for diarrhea -Stop indomethacin  for 1 month.  Patient should update us  if symptoms improve/resolve after this.  If not, we will proceed with a course of Xifaxan.  All questions were answered.      Matthew Fortune, MD Gastroenterology and Hepatology [facility]

## 2024-02-26 NOTE — Progress Notes (Unsigned)
 History of Present Illness: This 63 year old male comes in today for follow-up.  He has been seen previously by Lauraine Oz, NP for the following diagnoses:  BPH with symptoms Prior to his Rezum procedure in 2020 by Dr. Carolee in [place], he was treated with tamsulosin .  Currently he has a good stream.  Biggest issue is urgency and urgency incontinence.  He was given a trial of Gemtesa  previously by Lauraine Oz, it was too expensive for him.  Currently IPSS 9/3.    LUTS He has urinary hesitancy but also complains of urinary frequency, nocturia, urgency, urgency incontinence and nocturnal enuresis.     ED  He has significant ED.  He has tried sildenafil before that worked at 100 mg, but it was too expensive.  He is not on active nitrate therapy.       Past Medical History:  Diagnosis Date   Anxiety    Asthma    COPD (chronic obstructive pulmonary disease) (HCC)    Depression    Diabetes mellitus, type II (HCC)    GERD (gastroesophageal reflux disease)    Gout    HTN (hypertension)    IBS (irritable bowel syndrome)    Major depressive disorder in partial remission 08/30/2017   Mixed bipolar I disorder in remission 08/09/2022   Palpitations    Peripheral artery disease     Past Surgical History:  Procedure Laterality Date   APPENDECTOMY  1977   COLONOSCOPY N/A 01/12/2024   Procedure: COLONOSCOPY;  Surgeon: Eartha Angelia Sieving, MD;  [facility]: [facility];  Service: Gastroenterology;  Laterality: N/A;  10:15am, ASA 3   ESOPHAGOGASTRODUODENOSCOPY N/A 01/12/2024   Procedure: EGD (ESOPHAGOGASTRODUODENOSCOPY);  Surgeon: Eartha Angelia, Sieving, MD;  Location: [facility];  Service: Gastroenterology;  Laterality: N/A;   FUNDOPLASTY TRANSTHORACIC  1998   HERNIA REPAIR  2014   RECONSTRUCTION OF NOSE  1978    Home Medications:  Allergies as of 02/28/2024       Reactions   Bee Pollen    Milk-related Compounds Diarrhea   Penicillins Rash         Medication List        Accurate as of February 26, 2024  8:01 PM. If you have any questions, ask your nurse or doctor.          albuterol  108 (90 Base) MCG/ACT inhaler Commonly known as: VENTOLIN  HFA INHALE 1-2 PUFFS BY MOUTH EVERY 6 HOURS AS NEEDED FOR WHEEZE OR SHORTNESS OF BREATH   amLODipine 5 MG tablet Commonly known as: NORVASC Take 5 mg by mouth daily.   dicyclomine 20 MG tablet Commonly known as: BENTYL Take 20 mg by mouth 2 (two) times daily.   fenofibrate 54 MG tablet Take 54 mg by mouth daily.   fluticasone  50 MCG/ACT nasal spray Commonly known as: FLONASE  SPRAY 2 SPRAYS INTO EACH NOSTRIL EVERY DAY   Gemtesa  75 MG Tabs Generic drug: Vibegron  Take 1 tablet (75 mg total) by mouth daily.   glucose blood test strip Use to test blood sugars 2 times daily.   hydrOXYzine  10 MG tablet Commonly known as: ATARAX  Take 1 tablet (10 mg total) by mouth at bedtime.   indomethacin  25 MG capsule Commonly known as: INDOCIN  Take 1 capsule (25 mg total) by mouth 3 (three) times daily as needed.   lithium  carbonate 300 MG capsule Take 2 capsules (600 mg total) by mouth daily with breakfast AND 3 capsules (900 mg total) daily after supper.   losartan  25  MG tablet Commonly known as: COZAAR  TAKE 1 TABLET BY MOUTH DAILY. PLEASE CALL MD OFFICE TO MAKE APPT FOR FUTURE REFILL [phone]   metFORMIN  500 MG tablet Commonly known as: GLUCOPHAGE  Take 1 tablet (500 mg total) by mouth at bedtime.   metoprolol  tartrate 100 MG tablet Commonly known as: LOPRESSOR  Take 100 mg by mouth daily.   OXYGEN Place 2 L/min into the nose at bedtime.   pantoprazole 40 MG tablet Commonly known as: PROTONIX Take 40 mg by mouth daily.   traZODone  100 MG tablet Commonly known as: DESYREL  Take 1 tablet (100 mg total) by mouth at bedtime.   Trelegy Ellipta 100-62.5-25 MCG/ACT Aepb Generic drug: Fluticasone -Umeclidin-Vilant Inhale 1 puff into the lungs daily.   TRUEplus Lancets 30G  Misc Use to test blood sugars 2 times daily.        Allergies:  Allergies  Allergen Reactions   Bee Pollen    Milk-Related Compounds Diarrhea   Penicillins Rash    Family History  Problem Relation Age of Onset   Depression Mother    Anxiety disorder Mother    Schizophrenia Father    Suicidality Father    Suicidality Paternal Grandfather    Drug abuse Paternal Grandfather    Alcohol abuse Paternal Grandfather     Social History:  reports that he quit smoking about 25 years ago. His smoking use included cigarettes. He started smoking about 45 years ago. He has a 30 pack-year smoking history. He has never used smokeless tobacco. He reports current alcohol use. He reports that he does not use drugs.  ROS: A complete review of systems was performed.  All systems are negative except for pertinent findings as noted.  Physical Exam:  Vital signs in last 24 hours: There were no vitals taken for this visit. Constitutional:  Alert and oriented, No acute distress Cardiovascular: Regular rate  Respiratory: Normal respiratory effort Neurologic: Grossly intact, no focal deficits Psychiatric: Normal mood and affect  I have reviewed prior pt notes   I have reviewed urinalysis results--clear  I have reviewed prior PSA results--0.85 earlier this year  I have reviewed IPSS sheet   Impression/Assessment:  1.  BPH with LUTS-currently, he seems to be emptying well, mainly has OAB symptoms  2.  LUTS, bothersome  3.  ED, he would like to try sildenafil again  Plan:  1.  Overactive bladder discussion held with patient  2.  Trial prescription for solifenacin sent in  3.  Sildenafil 100 mg sent 10  4.  I will have him come back in a year to recheck

## 2024-02-28 ENCOUNTER — Ambulatory Visit: Admitting: Urology

## 2024-02-28 VITALS — BP 112/70 | HR 83

## 2024-02-28 DIAGNOSIS — R3911 Hesitancy of micturition: Secondary | ICD-10-CM | POA: Diagnosis not present

## 2024-02-28 DIAGNOSIS — R350 Frequency of micturition: Secondary | ICD-10-CM

## 2024-02-28 DIAGNOSIS — N3281 Overactive bladder: Secondary | ICD-10-CM

## 2024-02-28 DIAGNOSIS — N3941 Urge incontinence: Secondary | ICD-10-CM

## 2024-02-28 DIAGNOSIS — N319 Neuromuscular dysfunction of bladder, unspecified: Secondary | ICD-10-CM

## 2024-02-28 DIAGNOSIS — N401 Enlarged prostate with lower urinary tract symptoms: Secondary | ICD-10-CM

## 2024-02-28 DIAGNOSIS — N529 Male erectile dysfunction, unspecified: Secondary | ICD-10-CM | POA: Diagnosis not present

## 2024-02-28 DIAGNOSIS — N4889 Other specified disorders of penis: Secondary | ICD-10-CM

## 2024-02-28 DIAGNOSIS — R351 Nocturia: Secondary | ICD-10-CM | POA: Diagnosis not present

## 2024-02-28 LAB — URINALYSIS, ROUTINE W REFLEX MICROSCOPIC
Bilirubin, UA: NEGATIVE
Glucose, UA: NEGATIVE
Ketones, UA: NEGATIVE
Leukocytes,UA: NEGATIVE
Nitrite, UA: NEGATIVE
Protein,UA: NEGATIVE
RBC, UA: NEGATIVE
Specific Gravity, UA: 1.015 (ref 1.005–1.030)
Urobilinogen, Ur: 0.2 mg/dL (ref 0.2–1.0)
pH, UA: 7 (ref 5.0–7.5)

## 2024-02-28 LAB — BLADDER SCAN AMB NON-IMAGING: Scan Result: 7

## 2024-02-28 MED ORDER — SILDENAFIL CITRATE 100 MG PO TABS: ORAL_TABLET | ORAL | 99 refills | Status: AC

## 2024-02-28 MED ORDER — SOLIFENACIN SUCCINATE 10 MG PO TABS
10.0000 mg | ORAL_TABLET | Freq: Every day | ORAL | 11 refills | Status: AC
Start: 2024-02-28 — End: ?

## 2024-02-28 NOTE — Progress Notes (Signed)
 Bladder Scan completed today due to reason of BPH  Patient can void prior to the bladder scan. Bladder scan result: 7  Performed By: Exie DASEN. CMA  Additional notes- Patient is scheduled to follow up with MD

## 2024-03-01 LAB — ALPHA-GAL PANEL
Allergen, Mutton, f88: <0.1 kU/L
Allergen, Pork, f26: <0.1 kU/L
Beef: <0.1 kU/L
CLASS: 0
CLASS: 0
Class: 0
GALACTOSE-ALPHA-1,3-GALACTOSE IGE*: <0.1 kU/L (ref <0.10)

## 2024-03-01 LAB — INTERPRETATION:

## 2024-03-02 LAB — GENECONNECT MOLECULAR SCREEN: Genetic Analysis Overall Interpretation: NEGATIVE

## 2024-03-03 ENCOUNTER — Ambulatory Visit (INDEPENDENT_AMBULATORY_CARE_PROVIDER_SITE_OTHER): Payer: Self-pay | Admitting: Gastroenterology

## 2024-03-03 NOTE — Progress Notes (Signed)
 Hi, Please let him know his testing was negative for alpha gal. Thanks
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
## 2024-03-19 ENCOUNTER — Encounter (HOSPITAL_COMMUNITY): Payer: Self-pay | Admitting: Student in an Organized Health Care Education/Training Program

## 2024-03-19 ENCOUNTER — Ambulatory Visit (HOSPITAL_COMMUNITY): Admitting: Student in an Organized Health Care Education/Training Program

## 2024-03-19 VITALS — BP 129/74 | HR 71 | Ht 73.0 in | Wt 174.2 lb

## 2024-03-19 DIAGNOSIS — F3181 Bipolar II disorder: Secondary | ICD-10-CM | POA: Diagnosis not present

## 2024-03-19 DIAGNOSIS — Z79899 Other long term (current) drug therapy: Secondary | ICD-10-CM

## 2024-03-19 DIAGNOSIS — F5101 Primary insomnia: Secondary | ICD-10-CM | POA: Diagnosis not present

## 2024-03-20 ENCOUNTER — Ambulatory Visit (HOSPITAL_COMMUNITY): Admitting: Clinical

## 2024-03-22 ENCOUNTER — Encounter: Payer: Self-pay | Admitting: Adult Health

## 2024-03-22 ENCOUNTER — Ambulatory Visit: Payer: Medicare PPO | Admitting: Adult Health

## 2024-03-22 VITALS — BP 114/72 | HR 93 | Temp 98.1°F | Ht 73.0 in | Wt 174.6 lb

## 2024-03-22 DIAGNOSIS — Z0000 Encounter for general adult medical examination without abnormal findings: Secondary | ICD-10-CM | POA: Diagnosis not present

## 2024-03-22 NOTE — Progress Notes (Signed)
 No chief complaint on file.    Subjective:   Kendarious Gudino. is a 63 y.o. male who presents for a Medicare Annual Wellness Visit.  Allergies (verified) Bee pollen, Milk-related compounds, and Penicillins   History: Past Medical History:  Diagnosis Date   Anxiety    Asthma    COPD (chronic obstructive pulmonary disease) (HCC)    Depression    Diabetes mellitus, type II (HCC)    GERD (gastroesophageal reflux disease)    Gout    HTN (hypertension)    IBS (irritable bowel syndrome)    Major depressive disorder in partial remission 08/30/2017   Mixed bipolar I disorder in remission 08/09/2022   Palpitations    Peripheral artery disease    Past Surgical History:  Procedure Laterality Date   APPENDECTOMY  1977   COLONOSCOPY N/A 01/12/2024   Procedure: COLONOSCOPY;  Surgeon: Eartha Angelia Sieving, MD;  [facility];  Service: Gastroenterology;  Laterality: N/A;  10:15am, ASA 3   ESOPHAGOGASTRODUODENOSCOPY N/A 01/12/2024   Procedure: EGD (ESOPHAGOGASTRODUODENOSCOPY);  Surgeon: Eartha Angelia, Sieving, MD;  [facility];  Service: Gastroenterology;  Laterality: N/A;   FUNDOPLASTY TRANSTHORACIC  1998   HERNIA REPAIR  2014   RECONSTRUCTION OF NOSE  1978   Family History  Problem Relation Age of Onset   Depression Mother    Anxiety disorder Mother    Schizophrenia Father    Suicidality Father    Suicidality Paternal Grandfather    Drug abuse Paternal Grandfather    Alcohol abuse Paternal Grandfather    Social History   Occupational History   Not on file  Tobacco Use   Smoking status: Former    Current packs/day: 0.00    Average packs/day: 1.5 packs/day for 20.0 years (30.0 ttl pk-yrs)    Types: Cigarettes    Start date: 05/03/1978    Quit date: 05/03/1998    Years since quitting: 25.9   Smokeless tobacco: Never  Vaping Use   Vaping status: Never Used  Substance and Sexual Activity   Alcohol use: Yes    Comment: socially-small amount of  wine monthly - 1 glass    Drug use: No    Types: Marijuana    Comment: last use in 2018   Sexual activity: Not Currently    Partners: Female   Tobacco Counseling Counseling given: Not Answered  SDOH Screenings   Food Insecurity: Low Risk  (09/12/2023)   Received from [facility]  Housing: Low Risk  (09/12/2023)   Received from [facility]  Transportation Needs: No Transportation Needs (09/12/2023)   Received from [facility]  Utilities: Low Risk  (09/12/2023)   Received from [facility]  Depression (PHQ2-9): High Risk (02/16/2024)  Tobacco Use: Medium Risk (03/19/2024)   See flowsheets for full screening details  Depression Screen Depression Screening Exception Documentation Depression Screening Exception:: Other- indicate reason in comment box Depression Screening Exception Comment:: Patient see a Psychiatry  PHQ 2 & 9 Depression Scale- Over the past 2 weeks, how often have you been bothered by any of the following problems? Little interest or pleasure in doing things: 2 Feeling down, depressed, or hopeless (PHQ Adolescent also includes...irritable): 2 PHQ-2 Total Score: 4 Trouble falling or staying asleep, or sleeping too much: 2 Feeling tired or having little energy: 1 Poor appetite or overeating (PHQ Adolescent also includes...weight loss): 0 Feeling bad about yourself - or that you are a failure or have let yourself or your family down: 2 Trouble concentrating on  things, such as reading the newspaper or watching television [facility] Adolescent also includes...like school work): 1 Moving or speaking so slowly that other people could have noticed. Or the opposite - being so fidgety or restless that you have been moving around a lot more than usual: 1 Thoughts that you would be better off dead, or of hurting yourself in some way: 0 PHQ-9 Total Score: 11 If you checked off any problems, how difficult have these problems made it for you to do your work, take care of things at  home, or get along with other people?: Very difficult  Depression Treatment Depression Interventions/Treatment : Currently on Treatment     Goals Addressed   None    Functional Status Activities of Daily Living (to include ambulation/medication): (Patient-Rptd) Independent Ambulation: (Patient-Rptd) Independent Home Management: (Patient-Rptd) Independent  Fall Screening Falls in the past year?: (Patient-Rptd) 1 Number of falls in past year: (Patient-Rptd) 0 Was there an injury with Fall?: (Patient-Rptd) 0 Fall Risk Category Calculator: (Patient-Rptd) 1 Patient Fall Risk Level: (Patient-Rptd) Low Fall Risk  Fall Risk Patient at Risk for Falls Due to: No Fall Risks Fall risk Follow up: Falls evaluation completed  Advance Directives (For Healthcare) Does Patient Have a Medical Advance Directive?: No Would patient like information on creating a medical advance directive?: No - Patient declined        Objective:    There were no vitals filed for this visit. There is no height or weight on file to calculate BMI.  Current Medications (verified) Outpatient Encounter Medications as of 03/22/2024  Medication Sig   albuterol  (VENTOLIN  HFA) 108 (90 Base) MCG/ACT inhaler INHALE 1-2 PUFFS BY MOUTH EVERY 6 HOURS AS NEEDED FOR WHEEZE OR SHORTNESS OF BREATH   amLODipine (NORVASC) 5 MG tablet Take 5 mg by mouth daily.   dicyclomine (BENTYL) 20 MG tablet Take 20 mg by mouth 2 (two) times daily.   fenofibrate 54 MG tablet Take 54 mg by mouth daily.   fluticasone  (FLONASE ) 50 MCG/ACT nasal spray SPRAY 2 SPRAYS INTO EACH NOSTRIL EVERY DAY   glucose blood test strip Use to test blood sugars 2 times daily.   hydrOXYzine  (ATARAX ) 10 MG tablet Take 1 tablet (10 mg total) by mouth at bedtime.   indomethacin  (INDOCIN ) 25 MG capsule Take 1 capsule (25 mg total) by mouth 3 (three) times daily as needed.   lithium  carbonate 300 MG capsule Take 2 capsules (600 mg total) by mouth daily with breakfast  AND 3 capsules (900 mg total) daily after supper.   losartan  (COZAAR ) 25 MG tablet TAKE 1 TABLET BY MOUTH DAILY. PLEASE CALL MD OFFICE TO MAKE APPT FOR FUTURE REFILL [phone]   metFORMIN  (GLUCOPHAGE ) 500 MG tablet Take 1 tablet (500 mg total) by mouth at bedtime.   metoprolol  tartrate (LOPRESSOR ) 100 MG tablet Take 100 mg by mouth daily.   OXYGEN Place 2 L/min into the nose at bedtime.   pantoprazole (PROTONIX) 40 MG tablet Take 40 mg by mouth daily.   sildenafil (VIAGRA) 100 MG tablet Take 1/2 to 1 tablet p.o. as needed   solifenacin (VESICARE) 10 MG tablet Take 1 tablet (10 mg total) by mouth daily.   traZODone  (DESYREL ) 100 MG tablet Take 1 tablet (100 mg total) by mouth at bedtime.   TRELEGY ELLIPTA 100-62.5-25 MCG/ACT AEPB Inhale 1 puff into the lungs daily.   TRUEplus Lancets 30G MISC Use to test blood sugars 2 times daily.   No facility-administered encounter medications on file as  of 03/22/2024.   Hearing/Vision screen No results found. Immunizations and Health Maintenance Health Maintenance  Topic Date Due   Pneumococcal Vaccine: 50+ Years (2 of 2 - PCV) 07/13/2018   Influenza Vaccine  12/02/2023   FOOT EXAM  12/02/2023   Diabetic kidney evaluation - Urine ACR  01/13/2024   COVID-19 Vaccine (9 - 2025-26 season) 01/31/2024   Medicare Annual Wellness (AWV)  03/20/2024   OPHTHALMOLOGY EXAM  06/01/2024 (Originally 07/02/2023)   HEMOGLOBIN A1C  05/16/2024   Diabetic kidney evaluation - eGFR measurement  11/13/2024   DTaP/Tdap/Td (5 - Td or Tdap) 07/21/2033   Colonoscopy  01/11/2034   Hepatitis B Vaccines 19-59 Average Risk  Completed   Hepatitis C Screening  Completed   HIV Screening  Completed   Zoster Vaccines- Shingrix  Completed   HPV VACCINES  Aged Out   Meningococcal B Vaccine  Aged Out        Assessment/Plan:  This is a routine wellness examination for [place].  Patient Care Team: Medina-Vargas, Eulice Rutledge C, NP as PCP - General (Internal Medicine) Mallipeddi,  Diannah SQUIBB, MD as PCP - Cardiology (Cardiology) Eartha Flavors, Toribio, MD as Consulting Physician (Gastroenterology)  I have personally reviewed and noted the following in the patient's chart:   Medical and social history Use of alcohol, tobacco or illicit drugs  Current medications and supplements including opioid prescriptions. Functional ability and status Nutritional status Physical activity Advanced directives List of other physicians Hospitalizations, surgeries, and ER visits in previous 12 months Vitals Screenings to include cognitive, depression, and falls Referrals and appointments  No orders of the defined types were placed in this encounter.  In addition, I have reviewed and discussed with patient certain preventive protocols, quality metrics, and best practice recommendations. A written personalized care plan for preventive services as well as general preventive health recommendations were provided to patient.   Jebediah Macrae Medina-Vargas, NP   03/22/2024   No follow-ups on file.  After Visit Summary: (In Person-Printed) AVS printed and given to the patient  Nurse Notes:  Pls schedule your next annual wellness visit.  Chief Complaint  Patient presents with   Annual Wellness Visit     Subjective:   Jekhi Bolin. is a 63 y.o. male who presents for a Medicare Annual Wellness Visit.  Allergies (verified) Bee pollen, Milk-related compounds, and Penicillins   History: Past Medical History:  Diagnosis Date   Anxiety    Asthma    COPD (chronic obstructive pulmonary disease) (HCC)    Depression    Diabetes mellitus, type II (HCC)    GERD (gastroesophageal reflux disease)    Gout    HTN (hypertension)    IBS (irritable bowel syndrome)    Major depressive disorder in partial remission 08/30/2017   Mixed bipolar I disorder in remission 08/09/2022   Palpitations    Peripheral artery disease    Past Surgical History:  Procedure Laterality Date   APPENDECTOMY   1977   COLONOSCOPY N/A 01/12/2024   Procedure: COLONOSCOPY;  Surgeon: Eartha Flavors Toribio, MD;  [facility];  Service: Gastroenterology;  Laterality: N/A;  10:15am, ASA 3   ESOPHAGOGASTRODUODENOSCOPY N/A 01/12/2024   Procedure: EGD (ESOPHAGOGASTRODUODENOSCOPY);  Surgeon: Eartha Flavors, Toribio, MD;  [facility];  Service: Gastroenterology;  Laterality: N/A;   FUNDOPLASTY TRANSTHORACIC  1998   HERNIA REPAIR  2014   RECONSTRUCTION OF NOSE  1978   Family History  Problem Relation Age of Onset   Depression Mother    Anxiety  disorder Mother    Schizophrenia Father    Suicidality Father    Suicidality Paternal Grandfather    Drug abuse Paternal Grandfather    Alcohol abuse Paternal Grandfather    Social History   Occupational History   Not on file  Tobacco Use   Smoking status: Former    Current packs/day: 0.00    Average packs/day: 1.5 packs/day for 20.0 years (30.0 ttl pk-yrs)    Types: Cigarettes    Start date: 05/03/1978    Quit date: 05/03/1998    Years since quitting: 25.9   Smokeless tobacco: Never  Vaping Use   Vaping status: Never Used  Substance and Sexual Activity   Alcohol use: Yes    Comment: socially-small amount of wine monthly - 1 glass    Drug use: No    Types: Marijuana    Comment: last use in 2018   Sexual activity: Not Currently    Partners: Female   Tobacco Counseling Counseling given: Not Answered  SDOH Screenings   Food Insecurity: Low Risk  (09/12/2023)   Received from [facility]  Housing: Low Risk  (09/12/2023)   Received from [facility]  Transportation Needs: No Transportation Needs (09/12/2023)   Received from [facility]  Utilities: Low Risk  (09/12/2023)   Received from [facility]  Depression (PHQ2-9): High Risk (02/16/2024)  Tobacco Use: Medium Risk (03/19/2024)   See flowsheets for full screening details  Depression Screen Depression Screening Exception Documentation Depression Screening  Exception:: Other- indicate reason in comment box Depression Screening Exception Comment:: Patient see a Psychiatry  PHQ 2 & 9 Depression Scale- Over the past 2 weeks, how often have you been bothered by any of the following problems? Little interest or pleasure in doing things: 2 Feeling down, depressed, or hopeless (PHQ Adolescent also includes...irritable): 2 PHQ-2 Total Score: 4 Trouble falling or staying asleep, or sleeping too much: 2 Feeling tired or having little energy: 1 Poor appetite or overeating (PHQ Adolescent also includes...weight loss): 0 Feeling bad about yourself - or that you are a failure or have let yourself or your family down: 2 Trouble concentrating on things, such as reading the newspaper or watching television (PHQ Adolescent also includes...like school work): 1 Moving or speaking so slowly that other people could have noticed. Or the opposite - being so fidgety or restless that you have been moving around a lot more than usual: 1 Thoughts that you would be better off dead, or of hurting yourself in some way: 0 PHQ-9 Total Score: 11 If you checked off any problems, how difficult have these problems made it for you to do your work, take care of things at home, or get along with other people?: Very difficult  Depression Treatment Depression Interventions/Treatment : Currently on Treatment     Goals Addressed   None    Visit info / Clinical Intake: Medicare Wellness Visit Type:: Subsequent Annual Wellness Visit Persons participating in visit:: patient Medicare Wellness Visit Mode:: In-person (required for WTM) Information given by:: patient Interpreter Needed?: No Pre-visit prep was completed: no AWV questionnaire completed by patient prior to visit?: no Living arrangements:: lives with spouse/significant other Patient's Overall Health Status Rating: very good Typical amount of pain: some Does pain affect daily life?: (!) yes Are you currently prescribed  opioids?: no  Functional Status Activities of Daily Living (to include ambulation/medication): (Patient-Rptd) Independent Ambulation: (Patient-Rptd) Independent Home Management: (Patient-Rptd) Independent  Fall Screening Falls in the past year?: 1 Number of falls in past  year: 0 Was there an injury with Fall?: 0 Fall Risk Category Calculator: 1 Patient Fall Risk Level: Low Fall Risk  Fall Risk Patient at Risk for Falls Due to: History of fall(s) Fall risk Follow up: Falls evaluation completed  Cognitive Assessment Difficulty concentrating, remembering, or making decisions? : no Will 6CIT or Mini Cog be Completed: yes What year is it?: 0 points What month is it?: 0 points Give patient an address phrase to remember (5 components): [address] [place] About what time is it?: 0 points Count backwards from 20 to 1: 0 points Say the months of the year in reverse: 0 points Repeat the address phrase from earlier: 0 points 6 CIT Score: 0 points  Advance Directives (For Healthcare) Does Patient Have a Medical Advance Directive?: No Would patient like information on creating a medical advance directive?: No - Patient declined        Objective:    There were no vitals filed for this visit. There is no height or weight on file to calculate BMI.  Current Medications (verified) Outpatient Encounter Medications as of 03/22/2024  Medication Sig   albuterol  (VENTOLIN  HFA) 108 (90 Base) MCG/ACT inhaler INHALE 1-2 PUFFS BY MOUTH EVERY 6 HOURS AS NEEDED FOR WHEEZE OR SHORTNESS OF BREATH   amLODipine (NORVASC) 5 MG tablet Take 5 mg by mouth daily.   dicyclomine (BENTYL) 20 MG tablet Take 20 mg by mouth 2 (two) times daily.   fenofibrate 54 MG tablet Take 54 mg by mouth daily.   fluticasone  (FLONASE ) 50 MCG/ACT nasal spray SPRAY 2 SPRAYS INTO EACH NOSTRIL EVERY DAY   glucose blood test strip Use to test blood sugars 2 times daily.   hydrOXYzine  (ATARAX ) 10 MG tablet Take 1 tablet  (10 mg total) by mouth at bedtime.   indomethacin  (INDOCIN ) 25 MG capsule Take 1 capsule (25 mg total) by mouth 3 (three) times daily as needed.   lithium  carbonate 300 MG capsule Take 2 capsules (600 mg total) by mouth daily with breakfast AND 3 capsules (900 mg total) daily after supper.   losartan  (COZAAR ) 25 MG tablet TAKE 1 TABLET BY MOUTH DAILY. PLEASE CALL MD OFFICE TO MAKE APPT FOR FUTURE REFILL [phone]   metFORMIN  (GLUCOPHAGE ) 500 MG tablet Take 1 tablet (500 mg total) by mouth at bedtime.   metoprolol  tartrate (LOPRESSOR ) 100 MG tablet Take 100 mg by mouth daily.   OXYGEN Place 2 L/min into the nose at bedtime.   pantoprazole (PROTONIX) 40 MG tablet Take 40 mg by mouth daily.   promethazine -dextromethorphan (PROMETHAZINE -DM) 6.25-15 MG/5ML syrup Take 5 mLs by mouth 4 (four) times daily as needed.   sildenafil  (VIAGRA ) 100 MG tablet Take 1/2 to 1 tablet p.o. as needed   solifenacin  (VESICARE ) 10 MG tablet Take 1 tablet (10 mg total) by mouth daily.   traZODone  (DESYREL ) 100 MG tablet Take 1 tablet (100 mg total) by mouth at bedtime.   TRELEGY ELLIPTA 100-62.5-25 MCG/ACT AEPB Inhale 1 puff into the lungs daily.   TRUEplus Lancets 30G MISC Use to test blood sugars 2 times daily.   No facility-administered encounter medications on file as of 03/22/2024.   Hearing/Vision screen Hearing Screening - Comments:: No hearing check recently. Only notice in large crowds but not day to day Vision Screening - Comments:: Lat check in January Immunizations and Health Maintenance Health Maintenance  Topic Date Due   Pneumococcal Vaccine: 50+ Years (2 of 2 - PCV) 07/13/2018   FOOT EXAM  12/02/2023   Diabetic kidney evaluation - Urine ACR  01/13/2024   COVID-19 Vaccine (9 - 2025-26 season) 02/28/2024   OPHTHALMOLOGY EXAM  06/01/2024 (Originally 07/02/2023)   HEMOGLOBIN A1C  05/16/2024   Diabetic kidney evaluation - eGFR measurement  11/13/2024   Medicare Annual Wellness (AWV)  03/22/2025    DTaP/Tdap/Td (5 - Td or Tdap) 07/21/2033   Colonoscopy  01/11/2034   Influenza Vaccine  Completed   Hepatitis B Vaccines 19-59 Average Risk  Completed   Hepatitis C Screening  Completed   HIV Screening  Completed   Zoster Vaccines- Shingrix  Completed   HPV VACCINES  Aged Out   Meningococcal B Vaccine  Aged Out        Assessment/Plan:  This is a routine wellness examination for [place].  Patient Care Team: Medina-Vargas, Dametrius Sanjuan C, NP as PCP - General (Internal Medicine) Mallipeddi, Diannah SQUIBB, MD as PCP - Cardiology (Cardiology) Eartha Flavors, Toribio, MD as Consulting Physician (Gastroenterology) [facility]  I have personally reviewed and noted the following in the patient's chart:   Medical and social history Use of alcohol, tobacco or illicit drugs  Current medications and supplements including opioid prescriptions. Functional ability and status Nutritional status Physical activity Advanced directives List of other physicians Hospitalizations, surgeries, and ER visits in previous 12 months Vitals Screenings to include cognitive, depression, and falls Referrals and appointments  No orders of the defined types were placed in this encounter.  In addition, I have reviewed and discussed with patient certain preventive protocols, quality metrics, and best practice recommendations. A written personalized care plan for preventive services as well as general preventive health recommendations were provided to patient.   Evamae Rowen Medina-Vargas, NP   03/22/2024   Return in 1 year (on 03/22/2025).  After Visit Summary: (In Person-Printed) AVS printed and given to the patient  Nurse Notes:  Will need to schedule next year annual wellness visit.

## 2024-03-22 NOTE — Patient Instructions (Signed)
  [name] , Thank you for taking time to come for your Medicare Wellness Visit. I appreciate your ongoing commitment to your health goals. Please review the following plan we discussed and let me know if I can assist you in the future.   These are the goals we discussed:  Goals       Chronic Care Management (pt-stated)      CARE PLAN ENTRY  Current Barriers:  Chronic Disease Management support, education, and care coordination needs related to hypertension, Asthma, heartburn, Type 2 Diabetes, Osteoarthritis, BPH, anxiety, depression, Bipolar disorder, hyperlipidemia, and insomnia     Pharmacist Clinical Goal(s):  A1c goal <6.5% Blood Pressure goal <140/90 LDL (bad) cholesterol <100   Interventions: Comprehensive medication review performed. Recommend resuming Flonase  if allergy symptoms worsen. Will request refill be sent into patient pharmacy.  Patient Self Care Activities:  Self administers medications as prescribed Recommend decreasing glucose monitoring to once daily. Can occasionally check in evenings as well.  Initial goal documentation       DIET - INCREASE WATER INTAKE      - will drink at least 1L of water daily      Exercise 3x per week (30 min per time)      -  exercise 4-5 times/week, brisk walk      Exercise 3x per week (30 min per time)      Will do brisk walking      Patient Stated      Lose weight by cutting out carbs in the evening.        This is a list of the screening recommended for you and due dates:  Health Maintenance  Topic Date Due   Pneumococcal Vaccine for age over 25 (2 of 2 - PCV) 07/13/2018   Complete foot exam   12/02/2023   Yearly kidney health urinalysis for diabetes  01/13/2024   COVID-19 Vaccine (9 - 2025-26 season) 02/28/2024   Eye exam for diabetics  06/01/2024*   Hemoglobin A1C  05/16/2024   Yearly kidney function blood test for diabetes  11/13/2024   Medicare Annual Wellness Visit  03/22/2025   DTaP/Tdap/Td vaccine (5 - Td  or Tdap) 07/21/2033   Colon Cancer Screening  01/11/2034   Flu Shot  Completed   Hepatitis B Vaccine  Completed   Hepatitis C Screening  Completed   HIV Screening  Completed   Zoster (Shingles) Vaccine  Completed   HPV Vaccine  Aged Out   Meningitis B Vaccine  Aged Out  *Topic was postponed. The date shown is not the original due date.

## 2024-03-23 ENCOUNTER — Other Ambulatory Visit: Payer: Self-pay | Admitting: Internal Medicine

## 2024-03-26 ENCOUNTER — Encounter (INDEPENDENT_AMBULATORY_CARE_PROVIDER_SITE_OTHER): Payer: Self-pay | Admitting: Gastroenterology

## 2024-04-11 ENCOUNTER — Ambulatory Visit (INDEPENDENT_AMBULATORY_CARE_PROVIDER_SITE_OTHER): Admitting: Clinical

## 2024-04-11 ENCOUNTER — Encounter (HOSPITAL_COMMUNITY): Payer: Self-pay | Admitting: Clinical

## 2024-04-11 DIAGNOSIS — F4310 Post-traumatic stress disorder, unspecified: Secondary | ICD-10-CM | POA: Diagnosis not present

## 2024-04-11 DIAGNOSIS — F3181 Bipolar II disorder: Secondary | ICD-10-CM

## 2024-04-11 DIAGNOSIS — F411 Generalized anxiety disorder: Secondary | ICD-10-CM

## 2024-04-11 DIAGNOSIS — F424 Excoriation (skin-picking) disorder: Secondary | ICD-10-CM
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
## 2024-04-16 ENCOUNTER — Ambulatory Visit (HOSPITAL_COMMUNITY): Admitting: Student in an Organized Health Care Education/Training Program

## 2024-04-16 ENCOUNTER — Ambulatory Visit (HOSPITAL_COMMUNITY)

## 2024-04-16 ENCOUNTER — Encounter (HOSPITAL_COMMUNITY): Payer: Self-pay | Admitting: Student in an Organized Health Care Education/Training Program

## 2024-04-16 DIAGNOSIS — Z79899 Other long term (current) drug therapy: Secondary | ICD-10-CM

## 2024-04-16 DIAGNOSIS — F3181 Bipolar II disorder: Secondary | ICD-10-CM | POA: Diagnosis not present

## 2024-04-16 DIAGNOSIS — F5101 Primary insomnia: Secondary | ICD-10-CM | POA: Diagnosis not present

## 2024-04-16 MED ORDER — TRAZODONE HCL 100 MG PO TABS: 100.0000 mg | ORAL_TABLET | Freq: Every day | ORAL | 0 refills | Status: AC

## 2024-04-16 MED ORDER — HYDROXYZINE HCL 10 MG PO TABS: 10.0000 mg | ORAL_TABLET | Freq: Every day | ORAL | 0 refills | Status: AC

## 2024-04-16 MED ORDER — LITHIUM CARBONATE 300 MG PO CAPS: ORAL_CAPSULE | ORAL | 0 refills | Status: AC

## 2024-04-16 NOTE — Progress Notes (Signed)
 Patient arrived today for his due fasting labs, I drew 2 tiger tops using the right AC and a 22 G straight needle, patient tolerated well and without complaint

## 2024-04-17 LAB — RENAL FUNCTION PANEL
Albumin: 5 g/dL — ABNORMAL HIGH (ref 3.9–4.9)
BUN/Creatinine Ratio: 12 (ref 10–24)
BUN: 12 mg/dL (ref 8–27)
CO2: 19 mmol/L — ABNORMAL LOW (ref 20–29)
Calcium: 9.8 mg/dL (ref 8.6–10.2)
Chloride: 104 mmol/L (ref 96–106)
Creatinine, Ser: 0.99 mg/dL (ref 0.76–1.27)
Glucose: 112 mg/dL — ABNORMAL HIGH (ref 70–99)
Phosphorus: 3.6 mg/dL (ref 2.8–4.1)
Potassium: 4.8 mmol/L (ref 3.5–5.2)
Sodium: 140 mmol/L (ref 134–144)
eGFR: 85 mL/min/1.73 (ref >59)

## 2024-04-17 LAB — TSH+FREE T4
Free T4: 1.34 ng/dL (ref 0.82–1.77)
TSH: 2.61 u[IU]/mL (ref 0.450–4.500)

## 2024-04-17 LAB — LITHIUM LEVEL: Lithium Lvl: 0.5 mmol/L (ref 0.5–1.2)

## 2024-04-18 ENCOUNTER — Other Ambulatory Visit: Payer: Self-pay | Admitting: Internal Medicine

## 2024-04-18 NOTE — Telephone Encounter (Signed)
 Please call office to schedule appt w Dr Mallipeddi for further refills. [phone] Thank you 2nd Attempt

## 2024-05-04 ENCOUNTER — Encounter: Payer: Self-pay | Admitting: Internal Medicine

## 2024-05-04 ENCOUNTER — Ambulatory Visit: Admitting: Internal Medicine

## 2024-05-04 VITALS — BP 133/85 | HR 110 | Ht 73.0 in | Wt 174.0 lb

## 2024-05-04 DIAGNOSIS — J4489 Other specified chronic obstructive pulmonary disease: Secondary | ICD-10-CM | POA: Diagnosis not present

## 2024-05-04 DIAGNOSIS — Z87891 Personal history of nicotine dependence: Secondary | ICD-10-CM

## 2024-05-04 DIAGNOSIS — I10 Essential (primary) hypertension: Secondary | ICD-10-CM | POA: Diagnosis not present

## 2024-05-04 DIAGNOSIS — F17210 Nicotine dependence, cigarettes, uncomplicated: Secondary | ICD-10-CM | POA: Insufficient documentation

## 2024-05-04 NOTE — Patient Instructions (Addendum)
 Try taking the lopressor  one half pill twice daily   Plan A = Automatic = Always=    Symbicort 80 Take 2 puffs first thing in am and then another 2 puffs about 12 hours later.    Work on inhaler technique:  relax and gently blow all the way out then take a nice smooth full deep breath back in, triggering the inhaler at same time you start breathing in.  Hold breath in for at least  5 seconds if you can. Blow out symbicort  thru nose. Rinse and gargle with water when done.  If mouth or throat bother you at all,  try brushing teeth/gums/tongue with arm and hammer toothpaste/ make a slurry and gargle and spit out.   >>>  Remember how golfers warm up by taking practice swings - do this with an empty inhaler   Plan B = Backup (to supplement plan A, not to replace it) Use your albuterol  inhaler as a rescue medication to be used if you can't catch your breath by resting or slowing your pace  or doing a relaxed purse lip breathing pattern.  - The less you use it, the better it will work when you need it. - Ok to use the inhaler up to 2 puffs  every 4 hours if you must but call for appointment if use goes up over your usual need - Don't leave home without it !!  (think of it like the spare tire or starter fluid for your car)   Also  Ok to try albuterol  15 min before an activity (on alternating days)  that you know would usually make you short of breath and see if it makes any difference and if makes none then don't take albuterol  after activity unless you can't catch your breath as this means it's the resting that helps, not the albuterol .  Please remember to go to the lab department   for your tests - we will call you with the results when they are available.     Please schedule a follow up office visit in 6 weeks, call sooner if needed with all RESPIRATORY medications /inhalers in hand so we can verify exactly what you are taking. This includes all medications from all doctors and over the counters    Add:  needs LDSCT scheduled on return if not already done

## 2024-05-04 NOTE — Assessment & Plan Note (Addendum)
 Quit smoking 2019  May 2019 spirometry showed FEV1 at 79%, ratio 0.76, FVC 79% May 2019 exhaled nitric oxide  29 ppm CT lungs on coronary study 11/24/22 mild paraseptal emphysema   - Allergy screen 05/04/2024 >  Eos 0. /  IgE  / Alpha one AT phenotype pending  - 05/04/2024  After extensive coaching inhaler device,  effectiveness =    75% hfa  >>>  change trelegy to symbicort 80 2bid and approp saba  Comment: His cough may be trelegy related as daytime only and occurs on inspiration so rec trial of low dose symbiocort and more approp saba:   Re SABA :  I spent extra time with pt today reviewing appropriate use of albuterol  for prn use on exertion with the following points: 1) saba is for relief of sob that does not improve by walking a slower pace or resting but rather if the pt does not improve after trying this first. 2) If the pt is convinced, as many are, that saba helps recover from activity faster then it's easy to tell if this is the case by re-challenging : ie stop, take the inhaler, then p 5 minutes try the exact same activity (intensity of workload) that just caused the symptoms and see if they are substantially diminished or not after saba 3) if there is an activity that reproducibly causes the symptoms, try the saba 15 min before the activity on alternate days   If in fact the saba really does help, then fine to continue to use it prn but advised may need to look closer at the maintenance regimen being used to achieve better control of airways disease with exertion.

## 2024-05-04 NOTE — Progress Notes (Signed)
 "   [name], male    DOB: December 15, 1960    MRN: [id number]   Brief patient profile:  63   yowm  quit smoking in 2019  former [facility]  not seen since 2019 self-referred back to pulmonary clinic in [place]  05/04/2024  for asthma    TEST /Events  08/2017 Walked 500 feet on RA and O2 saturation remained at or above 99% May 2019 spirometry showed FEV1 at 79%, ratio 0.76, FVC 79% May 2019 exhaled nitric oxide  29 ppm   History of Present Illness  05/04/2024  Pulmonary/ 1st office eval/ [place] / [place] [facility] trelegy 100 /lopressor  100 Chief Complaint  Patient presents with   Establish Care    Shob (stress) / coughing / congestions  Non productive(clear yellow) and productive  Wants to come off o2   Dyspnea:  volunteers at food [employer] - carrying 30 lb / can't do inclines / no full flights  Cough: daily variably yellowish  Sleep: flat bed/ two pillows  SABA use: 2-3 x per day  02 use: has machine not using     No obvious day to day or daytime pattern/variability or assoc excess/ purulent sputum or mucus plugs or hemoptysis or cp or chest tightness, subjective wheeze or overt sinus or hb symptoms.    Also denies any obvious fluctuation of symptoms with weather or environmental changes or other aggravating or alleviating factors except as outlined above   No unusual exposure hx or h/o childhood pna/ asthma or knowledge of premature birth.  Current Allergies, Complete Past Medical History, Past Surgical History, Family History, and Social History were reviewed in [employer] record.  ROS  The following are not active complaints unless bolded Hoarseness, sore throat/globus, dysphagia, dental problems, itching, sneezing,  nasal congestion or discharge of excess mucus or purulent secretions, ear ache,   fever, chills, sweats, unintended wt loss or wt gain, classically pleuritic or exertional cp,  orthopnea pnd or arm/hand swelling  or leg swelling, presyncope,  palpitations, abdominal pain, anorexia, nausea, vomiting, diarrhea  or change in bowel habits or change in bladder habits, change in stools or change in urine, dysuria, hematuria,  rash, arthralgias, visual complaints, headache, numbness, weakness or ataxia or problems with walking or coordination,  change in mood or  memory.            Outpatient Medications Prior to Visit  Medication Sig Dispense Refill   albuterol  (VENTOLIN  HFA) 108 (90 Base) MCG/ACT inhaler INHALE 1-2 PUFFS BY MOUTH EVERY 6 HOURS AS NEEDED FOR WHEEZE OR SHORTNESS OF BREATH 8.5 each 3   amLODipine (NORVASC) 5 MG tablet Take 5 mg by mouth daily.     dicyclomine (BENTYL) 20 MG tablet Take 20 mg by mouth 2 (two) times daily.     fenofibrate 54 MG tablet Take 54 mg by mouth daily.     fluticasone  (FLONASE ) 50 MCG/ACT nasal spray SPRAY 2 SPRAYS INTO EACH NOSTRIL EVERY DAY 48 mL 1   glucose blood test strip Use to test blood sugars 2 times daily. 420 each 3   hydrOXYzine  (ATARAX ) 10 MG tablet Take 1 tablet (10 mg total) by mouth at bedtime. 90 tablet 0   indomethacin  (INDOCIN ) 25 MG capsule Take 1 capsule (25 mg total) by mouth 3 (three) times daily as needed. 30 capsule 0   lithium  carbonate 300 MG capsule Take 2 capsules (600 mg total) by mouth daily with breakfast AND 3 capsules (900 mg total) daily after  supper. 450 capsule 0   losartan  (COZAAR ) 25 MG tablet TAKE 1 TABLET BY MOUTH DAILY CALL OFFICE FOR APPT 30 tablet 0   metFORMIN  (GLUCOPHAGE ) 500 MG tablet Take 1 tablet (500 mg total) by mouth at bedtime.     metoprolol  tartrate (LOPRESSOR ) 100 MG tablet Take 100 mg by mouth daily.     OXYGEN Place 2 L/min into the nose at bedtime.     pantoprazole (PROTONIX) 40 MG tablet Take 40 mg by mouth daily.     promethazine -dextromethorphan (PROMETHAZINE -DM) 6.25-15 MG/5ML syrup Take 5 mLs by mouth 4 (four) times daily as needed.     sildenafil  (VIAGRA ) 100 MG tablet Take 1/2 to 1 tablet p.o. as needed 20 tablet prn   solifenacin   (VESICARE ) 10 MG tablet Take 1 tablet (10 mg total) by mouth daily. 30 tablet 11   traZODone  (DESYREL ) 100 MG tablet Take 1 tablet (100 mg total) by mouth at bedtime. 90 tablet 0   TRELEGY ELLIPTA 100-62.5-25 MCG/ACT AEPB Inhale 1 puff into the lungs daily.     TRUEplus Lancets 30G MISC Use to test blood sugars 2 times daily. 200 each 3   No facility-administered medications prior to visit.    Past Medical History:  Diagnosis Date   Allergy    Anxiety    Asthma    Cataract    COPD (chronic obstructive pulmonary disease) (HCC)    Depression    Diabetes mellitus, type II (HCC)    GERD (gastroesophageal reflux disease)    Gout    HTN (hypertension)    Hyperlipidemia    IBS (irritable bowel syndrome)    Major depressive disorder in partial remission 08/30/2017   Mixed bipolar I disorder in remission 08/09/2022   Palpitations    Peripheral artery disease       Objective:     BP 133/85   Pulse (!) 110   Ht 6' 1 (1.854 m)   Wt 174 lb (78.9 kg)   SpO2 94% Comment: ra  BMI 22.96 kg/m   SpO2: 94 % (ra) pleasant amb wm nad    HEENT : Oropharynx  clear        NECK :  without  apparent JVD/ palpable Nodes/TM    LUNGS: no acc muscle use,  Nl contour chest which is clear to A and P bilaterally with cough on insp  maneuvers    CV:  RRR  no s3 or murmur or increase in P2, and no edema   ABD:  soft and nontender   MS:  Gait nl   ext warm without deformities Or obvious joint restrictions  calf tenderness, cyanosis or clubbing    SKIN: warm and dry without lesions    NEURO:  alert, approp, nl sensorium with  no motor or cerebellar deficits apparent.       Assessment   Assessment & Plan Asthmatic bronchitis , chronic (HCC) Quit smoking 2019  May 2019 spirometry showed FEV1 at 79%, ratio 0.76, FVC 79% May 2019 exhaled nitric oxide  29 ppm CT lungs on coronary study 11/24/22 mild paraseptal emphysema   - Allergy screen 05/04/2024 >  Eos 0. /  IgE  / Alpha one AT  phenotype pending  - 05/04/2024  After extensive coaching inhaler device,  effectiveness =    75% hfa  >>>  change trelegy to symbicort 80 2bid and approp saba  Comment: His cough may be trelegy related as daytime only and occurs on inspiration so rec trial of low dose  symbiocort and more approp saba:   Re SABA :  I spent extra time with pt today reviewing appropriate use of albuterol  for prn use on exertion with the following points: 1) saba is for relief of sob that does not improve by walking a slower pace or resting but rather if the pt does not improve after trying this first. 2) If the pt is convinced, as many are, that saba helps recover from activity faster then it's easy to tell if this is the case by re-challenging : ie stop, take the inhaler, then p 5 minutes try the exact same activity (intensity of workload) that just caused the symptoms and see if they are substantially diminished or not after saba 3) if there is an activity that reproducibly causes the symptoms, try the saba 15 min before the activity on alternate days   If in fact the saba really does help, then fine to continue to use it prn but advised may need to look closer at the maintenance regimen being used to achieve better control of airways disease with exertion.   Essential hypertension In the setting of respiratory symptoms of unknown etiology,  It would be preferable to use bystolic, the most beta -1  selective Beta blocker available in sample form, with bisoprolol the most selective generic choice  on the market, at least on a trial basis, to make sure the spillover Beta 2 effects of the less specific Beta blockers are not contributing to this patient's symptoms.   >>> rec change lopressor  to 50 mg bid and if still symptomatic on return consider trial of bisoprolol 10 mg daily    Former cigarette smoker Low-dose CT lung cancer screening is recommended for patients who are 22-46 years of age with a 20+ pack-year  history of smoking and who are currently smoking or quit <=15 years ago. No coughing up blood  No unintentional weight loss of > 15 pounds in the last 6 months - pt is eligible for scanning yearly until 2034  > will refer on return if not done by PCP as appeared already ordered once     Each maintenance medication was reviewed in detail including emphasizing most importantly the difference between maintenance and prns and under what circumstances the prns are to be triggered using an action plan format where appropriate.  Total time for H and P, chart review, counseling, reviewing hfa  device(s) and generating customized AVS unique to this office visit / same day charting = 45 min new pt eval        AVS  Patient Instructions  Try taking the lopressor  one half pill twice daily   Plan A = Automatic = Always=    Symbicort 80 Take 2 puffs first thing in am and then another 2 puffs about 12 hours later.    Work on inhaler technique:  relax and gently blow all the way out then take a nice smooth full deep breath back in, triggering the inhaler at same time you start breathing in.  Hold breath in for at least  5 seconds if you can. Blow out symbicort  thru nose. Rinse and gargle with water when done.  If mouth or throat bother you at all,  try brushing teeth/gums/tongue with arm and hammer toothpaste/ make a slurry and gargle and spit out.   >>>  Remember how golfers warm up by taking practice swings - do this with an empty inhaler   Plan B = Backup (to supplement plan  A, not to replace it) Use your albuterol  inhaler as a rescue medication to be used if you can't catch your breath by resting or slowing your pace  or doing a relaxed purse lip breathing pattern.  - The less you use it, the better it will work when you need it. - Ok to use the inhaler up to 2 puffs  every 4 hours if you must but call for appointment if use goes up over your usual need - Don't leave home without it !!  (think of it  like the spare tire or starter fluid for your car)   Also  Ok to try albuterol  15 min before an activity (on alternating days)  that you know would usually make you short of breath and see if it makes any difference and if makes none then don't take albuterol  after activity unless you can't catch your breath as this means it's the resting that helps, not the albuterol .  Please remember to go to the lab department   for your tests - we will call you with the results when they are available.     Please schedule a follow up office visit in 6 weeks, call sooner if needed with all RESPIRATORY medications /inhalers in hand so we can verify exactly what you are taking. This includes all medications from all doctors and over the counters            Ozell America, MD 05/04/2024     "

## 2024-05-04 NOTE — Assessment & Plan Note (Addendum)
 Low-dose CT lung cancer screening is recommended for patients who are 72-65 years of age with a 20+ pack-year history of smoking and who are currently smoking or quit <=15 years ago. No coughing up blood  No unintentional weight loss of > 15 pounds in the last 6 months - pt is eligible for scanning yearly until 2034  > will refer on return if not done by PCP as appeared already ordered once

## 2024-05-04 NOTE — Assessment & Plan Note (Addendum)
 In the setting of respiratory symptoms of unknown etiology,  It would be preferable to use bystolic, the most beta -1  selective Beta blocker available in sample form, with bisoprolol the most selective generic choice  on the market, at least on a trial basis, to make sure the spillover Beta 2 effects of the less specific Beta blockers are not contributing to this patient's symptoms.   >>> rec change lopressor  to 50 mg bid and if still symptomatic on return consider trial of bisoprolol 10 mg daily

## 2024-05-08 ENCOUNTER — Ambulatory Visit: Payer: Self-pay | Admitting: Internal Medicine

## 2024-05-08 LAB — CBC WITH DIFFERENTIAL/PLATELET
Basophils Absolute: 0.1 x10E3/uL (ref 0.0–0.2)
Basos: 1 %
EOS (ABSOLUTE): 0.1 x10E3/uL (ref 0.0–0.4)
Eos: 1 %
Hematocrit: 45.8 % (ref 37.5–51.0)
Hemoglobin: 15.2 g/dL (ref 13.0–17.7)
Immature Grans (Abs): 0 x10E3/uL (ref 0.0–0.1)
Immature Granulocytes: 0 %
Lymphocytes Absolute: 1.8 x10E3/uL (ref 0.7–3.1)
Lymphs: 24 %
MCH: 29.6 pg (ref 26.6–33.0)
MCHC: 33.2 g/dL (ref 31.5–35.7)
MCV: 89 fL (ref 79–97)
Monocytes Absolute: 0.7 x10E3/uL (ref 0.1–0.9)
Monocytes: 9 %
Neutrophils Absolute: 4.9 x10E3/uL (ref 1.4–7.0)
Neutrophils: 65 %
Platelets: 295 x10E3/uL (ref 150–450)
RBC: 5.13 x10E6/uL (ref 4.14–5.80)
RDW: 13.2 % (ref 11.6–15.4)
WBC: 7.6 x10E3/uL (ref 3.4–10.8)

## 2024-05-08 LAB — IGE: IgE (Immunoglobulin E), Serum: 23 [IU]/mL (ref 6–495)

## 2024-05-08 LAB — ALPHA-1-ANTITRYPSIN PHENOTYP: A-1 Antitrypsin: 115 mg/dL (ref 101–187)

## 2024-05-13 ENCOUNTER — Other Ambulatory Visit: Payer: Self-pay | Admitting: Internal Medicine

## 2024-05-21 ENCOUNTER — Ambulatory Visit (INDEPENDENT_AMBULATORY_CARE_PROVIDER_SITE_OTHER): Payer: Self-pay | Admitting: Adult Health

## 2024-05-21 ENCOUNTER — Encounter: Payer: Self-pay | Admitting: Adult Health

## 2024-05-21 VITALS — BP 114/78 | HR 77 | Temp 97.3°F | Ht 73.0 in | Wt 177.2 lb

## 2024-05-21 DIAGNOSIS — R350 Frequency of micturition: Secondary | ICD-10-CM

## 2024-05-21 DIAGNOSIS — F3181 Bipolar II disorder: Secondary | ICD-10-CM | POA: Diagnosis not present

## 2024-05-21 DIAGNOSIS — N401 Enlarged prostate with lower urinary tract symptoms: Secondary | ICD-10-CM | POA: Diagnosis not present

## 2024-05-21 DIAGNOSIS — Z7984 Long term (current) use of oral hypoglycemic drugs: Secondary | ICD-10-CM

## 2024-05-21 DIAGNOSIS — K219 Gastro-esophageal reflux disease without esophagitis: Secondary | ICD-10-CM

## 2024-05-21 DIAGNOSIS — J4489 Other specified chronic obstructive pulmonary disease: Secondary | ICD-10-CM | POA: Diagnosis not present

## 2024-05-21 DIAGNOSIS — E785 Hyperlipidemia, unspecified: Secondary | ICD-10-CM | POA: Diagnosis not present

## 2024-05-21 DIAGNOSIS — I10 Essential (primary) hypertension: Secondary | ICD-10-CM

## 2024-05-21 DIAGNOSIS — E118 Type 2 diabetes mellitus with unspecified complications: Secondary | ICD-10-CM

## 2024-05-21 DIAGNOSIS — F5105 Insomnia due to other mental disorder: Secondary | ICD-10-CM | POA: Diagnosis not present

## 2024-05-21 NOTE — Progress Notes (Unsigned)
 "  [facility]  Provider:  Jereld Serum DNP  Code Status:  Full Code  Goals of Care:     03/22/2024    9:39 AM  Advanced Directives  Does Patient Have a Medical Advance Directive? No  Would patient like information on creating a medical advance directive? No - Patient declined     Chief Complaint  Patient presents with   Medication management of chronic issues    3 month follow up. Discuss the need for Pneumo, and covid vaccines. Due for foot exam and lab work.     HPI: Patient is a 64 y.o. male seen today for an acute visit for Discussed the use of AI scribe software for clinical note transcription with the patient, who gave verbal consent to proceed.  History of Present Illness   Lab Results  Component Value Date   CHOL 84 11/14/2023   HDL 37 (L) 11/14/2023   LDLCALC 16 11/14/2023   TRIG 247 (H) 11/14/2023   CHOLHDL 2.3 11/14/2023       Past Medical History:  Diagnosis Date   Allergy    Anxiety    Asthma    Cataract    COPD (chronic obstructive pulmonary disease) (HCC)    Depression    Diabetes mellitus, type II (HCC)    GERD (gastroesophageal reflux disease)    Gout    HTN (hypertension)    Hyperlipidemia    IBS (irritable bowel syndrome)    Major depressive disorder in partial remission 08/30/2017   Mixed bipolar I disorder in remission 08/09/2022   Palpitations    Peripheral artery disease     Past Surgical History:  Procedure Laterality Date   APPENDECTOMY     COLONOSCOPY N/A 01/12/2024   Procedure: COLONOSCOPY;  Surgeon: Eartha Angelia Sieving, MD;  Location: [facility];  Service: Gastroenterology;  Laterality: N/A;  10:15am, ASA 3   ESOPHAGOGASTRODUODENOSCOPY N/A 01/12/2024   Procedure: EGD (ESOPHAGOGASTRODUODENOSCOPY);  Surgeon: Eartha Angelia, Sieving, MD;  Location: [facility];  Service: Gastroenterology;  Laterality: N/A;   FUNDOPLASTY TRANSTHORACIC  1998   HERNIA REPAIR     RECONSTRUCTION OF NOSE  1978     Allergies[1]  Outpatient Encounter Medications as of 05/21/2024  Medication Sig   albuterol  (VENTOLIN  HFA) 108 (90 Base) MCG/ACT inhaler INHALE 1-2 PUFFS BY MOUTH EVERY 6 HOURS AS NEEDED FOR WHEEZE OR SHORTNESS OF BREATH   amLODipine (NORVASC) 5 MG tablet Take 5 mg by mouth daily.   atorvastatin  (LIPITOR) 40 MG tablet Take 20 mg by mouth daily.   Azelastine HCl 137 MCG/SPRAY SOLN Place 2 sprays into both nostrils 2 (two) times daily.   dicyclomine (BENTYL) 20 MG tablet Take 20 mg by mouth 2 (two) times daily.   famotidine (PEPCID) 40 MG tablet Take 40 mg by mouth daily.   fenofibrate 54 MG tablet Take 54 mg by mouth daily.   fluticasone  (FLONASE ) 50 MCG/ACT nasal spray SPRAY 2 SPRAYS INTO EACH NOSTRIL EVERY DAY   glucose blood test strip Use to test blood sugars 2 times daily.   hydrOXYzine  (ATARAX ) 10 MG tablet Take 1 tablet (10 mg total) by mouth at bedtime.   indomethacin  (INDOCIN ) 25 MG capsule Take 1 capsule (25 mg total) by mouth 3 (three) times daily as needed.   lidocaine  (LIDODERM ) 5 % Place 1 patch onto the skin daily.   lithium  carbonate 300 MG capsule Take 2 capsules (600 mg total) by mouth daily with breakfast AND 3 capsules (900 mg total) daily  after supper.   losartan  (COZAAR ) 25 MG tablet Take 1 tablet (25 mg total) by mouth daily. Pt must schedule an overdue followup appt with Cardiology for any more refills. [phone] Thank you 3rd and FINAL Attempt   metFORMIN  (GLUCOPHAGE ) 500 MG tablet Take 1 tablet (500 mg total) by mouth at bedtime.   metoprolol  succinate (TOPROL -XL) 50 MG 24 hr tablet Take 50 mg by mouth daily.   OXYGEN Place 2 L/min into the nose at bedtime.   pantoprazole (PROTONIX) 40 MG tablet Take 40 mg by mouth daily.   promethazine -dextromethorphan (PROMETHAZINE -DM) 6.25-15 MG/5ML syrup Take 5 mLs by mouth 4 (four) times daily as needed.   sildenafil  (VIAGRA ) 100 MG tablet Take 1/2 to 1 tablet p.o. as needed   solifenacin  (VESICARE ) 10 MG tablet Take 1  tablet (10 mg total) by mouth daily.   traZODone  (DESYREL ) 100 MG tablet Take 1 tablet (100 mg total) by mouth at bedtime.   TRELEGY ELLIPTA 100-62.5-25 MCG/ACT AEPB Inhale 1 puff into the lungs daily.   TRUEplus Lancets 30G MISC Use to test blood sugars 2 times daily.   metoprolol  tartrate (LOPRESSOR ) 100 MG tablet Take 100 mg by mouth daily. (Patient not taking: Reported on 05/21/2024)   No facility-administered encounter medications on file as of 05/21/2024.    Review of Systems:  Review of Systems  Constitutional:  Negative for activity change, appetite change and fever.  HENT:  Negative for sore throat.   Eyes: Negative.   Cardiovascular:  Negative for chest pain and leg swelling.  Gastrointestinal:  Negative for abdominal distention, diarrhea and vomiting.  Genitourinary:  Negative for dysuria, frequency and urgency.  Skin:  Negative for color change.  Neurological:  Negative for dizziness and headaches.  Psychiatric/Behavioral:  Negative for behavioral problems and sleep disturbance. The patient is not nervous/anxious.     Health Maintenance  Topic Date Due   Diabetic kidney evaluation - Urine ACR  01/13/2024   HEMOGLOBIN A1C  05/16/2024   OPHTHALMOLOGY EXAM  06/01/2024 (Originally 07/02/2023)   COVID-19 Vaccine (9 - 2025-26 season) 06/06/2024 (Originally 02/28/2024)   Pneumococcal Vaccine: 50+ Years (2 of 2 - PCV) 05/21/2025 (Originally 07/13/2018)   Medicare Annual Wellness (AWV)  03/22/2025   Diabetic kidney evaluation - eGFR measurement  04/16/2025   FOOT EXAM  05/21/2025   DTaP/Tdap/Td (5 - Td or Tdap) 07/21/2033   Colonoscopy  01/11/2034   Influenza Vaccine  Completed   Hepatitis B Vaccines 19-59 Average Risk  Completed   HPV VACCINES (No Doses Required) Completed   Hepatitis C Screening  Completed   HIV Screening  Completed   Zoster Vaccines- Shingrix  Completed   Meningococcal B Vaccine  Aged Out    Physical Exam: Vitals:   05/21/24 0918  BP: 114/78  Pulse: 77   Temp: (!) 97.3 F (36.3 C)  SpO2: 98%  Weight: 177 lb 3.2 oz (80.4 kg)  Height: 6' 1 (1.854 m)   Body mass index is 23.38 kg/m. Physical Exam Constitutional:      Appearance: Normal appearance.  HENT:     Head: Normocephalic and atraumatic.     Mouth/Throat:     Mouth: Mucous membranes are moist.  Eyes:     Conjunctiva/sclera: Conjunctivae normal.  Cardiovascular:     Rate and Rhythm: Normal rate and regular rhythm.     Pulses: Normal pulses.     Heart sounds: Normal heart sounds.  Pulmonary:     Effort: Pulmonary effort is normal.  Breath sounds: Normal breath sounds.  Abdominal:     General: Bowel sounds are normal.     Palpations: Abdomen is soft.  Musculoskeletal:        General: No swelling. Normal range of motion.     Cervical back: Normal range of motion.  Skin:    General: Skin is warm and dry.  Neurological:     General: No focal deficit present.     Mental Status: He is alert and oriented to person, place, and time.  Psychiatric:        Mood and Affect: Mood normal.        Behavior: Behavior normal.        Thought Content: Thought content normal.        Judgment: Judgment normal.     Labs reviewed: Basic Metabolic Panel: Recent Labs    08/01/23 0929 11/14/23 1045 01/17/24 1105 04/16/24 1057  NA 143 139  --  140  K 4.8 4.5  --  4.8  CL 109* 106  --  104  CO2 20 25  --  19*  GLUCOSE 115* 101*  --  112*  BUN 14 15  --  12  CREATININE 0.96 0.81 1.00 0.99  CALCIUM  9.3 9.7  --  9.8  PHOS  --   --   --  3.6  TSH 2.270  --   --  2.610   Liver Function Tests: Recent Labs    08/01/23 0929 04/16/24 1057  AST 22  --   ALT 36  --   ALKPHOS 98  --   BILITOT 1.3*  --   PROT 6.5  --   ALBUMIN 4.4 5.0*   No results for input(s): LIPASE, AMYLASE in the last 8760 hours. No results for input(s): AMMONIA in the last 8760 hours. CBC: Recent Labs    08/01/23 0929 11/14/23 1045 05/04/24 1149  WBC CANCELED 7.3 7.6  NEUTROABS  --  4,730  4.9  HGB CANCELED 13.9 15.2  HCT CANCELED 43.0 45.8  MCV  --  90.7 89  PLT CANCELED 273 295   Lipid Panel: Recent Labs    11/14/23 1045  CHOL 84  HDL 37*  LDLCALC 16  TRIG 752*  CHOLHDL 2.3   Lab Results  Component Value Date   HGBA1C 5.8 (H) 11/14/2023    Procedures since last visit: No results found.  Assessment/Plan    Labs/tests ordered:   A1C, CMP, lipid panel   Return in about 3 months (around 08/19/2024).  Ahmet Schank Medina-Vargas, NP      [1]  Allergies Allergen Reactions   Bee Pollen    Milk-Related Compounds Diarrhea   Penicillins Rash   "

## 2024-05-22 ENCOUNTER — Ambulatory Visit: Payer: Self-pay | Admitting: Adult Health

## 2024-05-22 LAB — COMPREHENSIVE METABOLIC PANEL WITH GFR
AG Ratio: 2.1 (calc) (ref 1.0–2.5)
ALT: 31 U/L (ref 9–46)
AST: 17 U/L (ref 10–35)
Albumin: 4.9 g/dL (ref 3.6–5.1)
Alkaline phosphatase (APISO): 69 U/L (ref 35–144)
BUN: 15 mg/dL (ref 7–25)
CO2: 25 mmol/L (ref 20–32)
Calcium: 9.7 mg/dL (ref 8.6–10.3)
Chloride: 106 mmol/L (ref 98–110)
Creat: 0.83 mg/dL (ref 0.70–1.35)
Globulin: 2.3 g/dL (ref 1.9–3.7)
Glucose, Bld: 104 mg/dL — ABNORMAL HIGH (ref 65–99)
Potassium: 4.8 mmol/L (ref 3.5–5.3)
Sodium: 139 mmol/L (ref 135–146)
Total Bilirubin: 1.1 mg/dL (ref 0.2–1.2)
Total Protein: 7.2 g/dL (ref 6.1–8.1)
eGFR: 98 mL/min/1.73m2

## 2024-05-22 LAB — LIPID PANEL
Cholesterol: 138 mg/dL
HDL: 39 mg/dL — ABNORMAL LOW
LDL Cholesterol (Calc): 71 mg/dL
Non-HDL Cholesterol (Calc): 99 mg/dL
Total CHOL/HDL Ratio: 3.5 (calc)
Triglycerides: 214 mg/dL — ABNORMAL HIGH

## 2024-05-22 LAB — HEMOGLOBIN A1C
Hgb A1c MFr Bld: 5.4 %
Mean Plasma Glucose: 108 mg/dL
eAG (mmol/L): 6 mmol/L

## 2024-05-22 NOTE — Progress Notes (Signed)
 Continue Fenofibrate and Atorvastatin .

## 2024-05-22 NOTE — Progress Notes (Signed)
-   Electrolytes, liver enzymes, all normal - Triglycerides 214, down from 247, continue atorvastatin    -   A1c 5.4, down from 5.8, discontinue metformin

## 2024-05-23 ENCOUNTER — Other Ambulatory Visit: Payer: Self-pay | Admitting: Internal Medicine

## 2024-05-30 ENCOUNTER — Telehealth (HOSPITAL_COMMUNITY): Payer: Self-pay | Admitting: Student in an Organized Health Care Education/Training Program

## 2024-06-06 ENCOUNTER — Ambulatory Visit (HOSPITAL_COMMUNITY): Admitting: Student in an Organized Health Care Education/Training Program

## 2024-06-13 ENCOUNTER — Ambulatory Visit (HOSPITAL_COMMUNITY): Admitting: Clinical

## 2024-06-19 ENCOUNTER — Ambulatory Visit: Admitting: Internal Medicine

## 2024-06-27 ENCOUNTER — Ambulatory Visit (HOSPITAL_COMMUNITY): Admitting: Student in an Organized Health Care Education/Training Program

## 2024-08-28 ENCOUNTER — Ambulatory Visit: Admitting: Adult Health

## 2025-03-04 ENCOUNTER — Ambulatory Visit: Admitting: Urology

## 2025-03-25 ENCOUNTER — Ambulatory Visit: Admitting: Adult Health
